# Patient Record
Sex: Female | Born: 1935 | Race: White | Hispanic: Yes | Smoking: Never smoker
Health system: Southern US, Community
[De-identification: ages and names within clinical notes are randomized; demographics above are authoritative.]

## PROBLEM LIST (undated history)

## (undated) DIAGNOSIS — I219 Acute myocardial infarction, unspecified: Secondary | ICD-10-CM

## (undated) DIAGNOSIS — I509 Heart failure, unspecified: Secondary | ICD-10-CM

## (undated) DIAGNOSIS — I10 Essential (primary) hypertension: Secondary | ICD-10-CM

## (undated) HISTORY — PX: CATARACT EXTRACTION: SUR2

## (undated) HISTORY — PX: ROTATOR CUFF REPAIR: SHX139

## (undated) HISTORY — PX: BACK SURGERY: SHX140

## (undated) HISTORY — PX: OTHER SURGICAL HISTORY: SHX169

---

## 2012-12-21 ENCOUNTER — Inpatient Hospital Stay: Payer: Self-pay | Admitting: Internal Medicine

## 2012-12-21 LAB — COMPREHENSIVE METABOLIC PANEL
Albumin: 4.1 g/dL (ref 3.4–5.0)
Alkaline Phosphatase: 57 U/L (ref 50–136)
Anion Gap: 5 — ABNORMAL LOW (ref 7–16)
Calcium, Total: 9.2 mg/dL (ref 8.5–10.1)
Chloride: 105 mmol/L (ref 98–107)
Co2: 28 mmol/L (ref 21–32)
Creatinine: 1.26 mg/dL (ref 0.60–1.30)
EGFR (African American): 48 — ABNORMAL LOW
EGFR (Non-African Amer.): 41 — ABNORMAL LOW
Total Protein: 6.9 g/dL (ref 6.4–8.2)

## 2012-12-21 LAB — LIPID PANEL
HDL Cholesterol: 44 mg/dL (ref 40–60)
Ldl Cholesterol, Calc: 128 mg/dL — ABNORMAL HIGH (ref 0–100)
VLDL Cholesterol, Calc: 33 mg/dL (ref 5–40)

## 2012-12-21 LAB — CBC
HCT: 39.3 % (ref 35.0–47.0)
HGB: 13.5 g/dL (ref 12.0–16.0)
MCH: 31.4 pg (ref 26.0–34.0)
RBC: 4.3 10*6/uL (ref 3.80–5.20)
RDW: 12.8 % (ref 11.5–14.5)

## 2012-12-22 LAB — CBC WITH DIFFERENTIAL/PLATELET
Basophil #: 0.1 10*3/uL (ref 0.0–0.1)
Eosinophil #: 0.3 10*3/uL (ref 0.0–0.7)
Eosinophil %: 5 %
HCT: 37.4 % (ref 35.0–47.0)
HGB: 12.9 g/dL (ref 12.0–16.0)
Lymphocyte #: 1.8 10*3/uL (ref 1.0–3.6)
MCV: 91 fL (ref 80–100)
Neutrophil #: 3.2 10*3/uL (ref 1.4–6.5)
Neutrophil %: 54.9 %
RDW: 12.9 % (ref 11.5–14.5)
WBC: 5.8 10*3/uL (ref 3.6–11.0)

## 2012-12-22 LAB — BASIC METABOLIC PANEL
Anion Gap: 4 — ABNORMAL LOW (ref 7–16)
Creatinine: 1.12 mg/dL (ref 0.60–1.30)
EGFR (Non-African Amer.): 47 — ABNORMAL LOW

## 2012-12-22 LAB — CK TOTAL AND CKMB (NOT AT ARMC): CK, Total: 599 U/L — ABNORMAL HIGH (ref 21–215)

## 2012-12-22 LAB — APTT: Activated PTT: 36.3 secs — ABNORMAL HIGH (ref 23.6–35.9)

## 2012-12-26 DIAGNOSIS — E785 Hyperlipidemia, unspecified: Secondary | ICD-10-CM | POA: Insufficient documentation

## 2012-12-26 DIAGNOSIS — I252 Old myocardial infarction: Secondary | ICD-10-CM | POA: Insufficient documentation

## 2012-12-26 DIAGNOSIS — I2510 Atherosclerotic heart disease of native coronary artery without angina pectoris: Secondary | ICD-10-CM | POA: Insufficient documentation

## 2013-01-08 ENCOUNTER — Encounter: Payer: Self-pay | Admitting: Cardiovascular Disease

## 2013-01-09 DIAGNOSIS — I509 Heart failure, unspecified: Secondary | ICD-10-CM | POA: Insufficient documentation

## 2013-02-02 ENCOUNTER — Encounter: Payer: Self-pay | Admitting: Cardiovascular Disease

## 2013-03-05 ENCOUNTER — Encounter: Payer: Self-pay | Admitting: Cardiovascular Disease

## 2013-06-23 ENCOUNTER — Ambulatory Visit: Payer: Self-pay | Admitting: Internal Medicine

## 2014-03-16 DIAGNOSIS — I059 Rheumatic mitral valve disease, unspecified: Secondary | ICD-10-CM | POA: Insufficient documentation

## 2014-04-21 ENCOUNTER — Ambulatory Visit: Payer: Self-pay | Admitting: Orthopedic Surgery

## 2014-06-25 NOTE — Discharge Summary (Signed)
PATIENT NAME:  Gail Paul, Gail Paul MR#:  [id number] DATE OF BIRTH:  12-Sep-1933  DATE OF ADMISSION:  12/21/2012 DATE OF DISCHARGE:  12/23/2012  PRESENTING COMPLAINT: Nausea and vomiting.   DISCHARGE DIAGNOSES:  1.  Acute non-Q-wave myocardial infarction, status post cardiac catheterization.  2.  Coronary artery disease, new onset.  3.  Hyperlipidemia.   CONDITION ON DISCHARGE: Fair.   CODE STATUS: Full code.   CARDIAC CATHETERIZATION: Done by Dr. Adrian BlackwaterShaukat Khan on 10/20 showed 40% stenosis in mid LAD, proximal circumflex 30% stenosis, first obtuse marginal there was 50% stenosis. Proximal RCA there was 90%, mid RCA stenoses that was tubular 100% stenosis of the lesion which was associated with small filling defect,  consistent with thrombus. Medical management recommended.   MEDICATIONS:   1.  Imdur 30 mg p.o. daily.  2.  Ranexa 1000 mg extended-release b.Paul.d.  3.  Atorvastatin 80 mg daily.  4.  Aspirin 81 mg daily.  5.  Plavix 75 mg daily.  6.  Metoprolol extended-release 25 mg daily.  7.  Nitroglycerin 0.4 mg sublingually as needed.   DIET: Low-fat, low-cholesterol.   FOLLOWUP: With Dr. Adrian BlackwaterShaukat Khan, Friday 10/24 at 10:00 a.m.   LABS AT DISCHARGE: Echo Doppler showed ejection fraction of 60% to 65%, mild concentrate left ventricular hypertrophy, mild increased left ventricular septal thickness and moderately increased left posterior wall thickness.   PROCEDURES: Cardiac catheterization done by Dr. Adrian BlackwaterShaukat Khan, results as above. Troponin went up to 0.46, 3.9 and 4.88. CBC within normal limits.   BRIEF SUMMARY OF HOSPITAL COURSE: Gail Paul is a very pleasant Caucasian female with history of hyperlipidemia, who comes to the Emergency Room after she started having nausea, vomiting, and burning sensation in her midsternal area. She was found to have elevated troponin and bradycardia along with EKG changes suggestive of inferior wall MI. She was admitted with:  1.  Acute non-Q-wave MI. The  patient was admitted in the CCU and initially started on Lovenox, which was changed to IV heparin drip by Dr. Welton FlakesKhan. Her troponin went from 0.46 to 3.9 to 4.8. She did not have any chest pain. EKG showed ST-T changes with bradycardia. She was placed on aspirin, Toprol, Ranexa, Imdur, statins and Plavix. Cardiac catheter was performed and results as above were noted. Medical management was recommended per cardiology. Cardiac rehab will be set up by Dr. Welton FlakesKhan as an outpatient.  2.  Hyperlipidemia, on atorvastatin.  3.  Hypotension, likely due to inferior wall MI, stable after IV fluids.  4.  DVT prophylaxis. The patient was kept on heparin, aspirin and Plavix.  5.  Hospital stay otherwise remained stable. The patient was advised lifestyle changes along with diet and exercise. She voiced understanding. She will follow up with Dr. Welton FlakesKhan on the above-stated date. Hospital stay remained stable.   CODE STATUS: She remained a full code.   TIME SPENT: 40 minutes.   ____________________________ Wylie HailSona A. Allena KatzPatel, MD sap:aw D: 12/24/2012 07:38:07 ET T: 12/24/2012 07:58:29 ET JOB#: [id number]  cc: Renada Cronin A. Allena KatzPatel, MD, <Dictator> Laurier NancyShaukat A. Khan, MD Willow OraSONA A Quanna Wittke MD ELECTRONICALLY SIGNED 12/28/2012 10:53

## 2014-06-25 NOTE — Consult Note (Signed)
PATIENT NAME:  Gail Paul, Gail Paul MR#:  [id number] DATE OF BIRTH:  September 18, 1933  DATE OF CONSULTATION:  12/22/2012  CONSULTING PHYSICIAN:  Laurier NancyShaukat A. Khan, MD  INDICATION FOR CONSULTATION: Chest pain.   HISTORY OF PRESENT ILLNESS: This is a 81 year old Hispanic female with a past medical history of hyperlipidemia, came into the hospital with an episode of chest pain and vomiting. The patient states that yesterday she went shopping and in the shopping malls, she smelled fumes, and afterwards, she told her friend that she was feeling sick, and she got out of the shopping mall, started feeling short of breath, epigastric pain, and had to sit down. Her face was flushed. She went home and took a nap and 3 hours later woke up and started raking leaves at her home, started having, again, epigastric pain. This time, it was much severe and associated with nausea and vomiting; thus, she was brought to the hospital.   PAST MEDICAL HISTORY: Hyperlipidemia.   ALLERGIES: None.   MEDICATIONS:  None.   SOCIAL HISTORY: She denies EtOH abuse or smoking.   FAMILY HISTORY:  Her mother died at 79. Her father died at age 79; apparently, he was a heavy smoker, did have coronary artery disease.   PHYSICAL EXAMINATION:  GENERAL: She is alert, oriented x 3, in no acute distress.  VITAL SIGNS: Stable.  NECK:  Revealed no JVD.  LUNGS: Clear.  HEART: Regular rate, rhythm. Normal S1, S2. No audible murmur.  ABDOMEN: Soft, nontender. Positive bowel sounds.  EXTREMITIES: No pedal edema.   Initial troponin on labs was 0.60, then 0.46. Her LDL was 128. Triglyceride 167, total cholesterol 205, HDL 44. Her troponin now is 3.90. CPK is 688, MB fraction 31.3. Creatinine is 1.12. Potassium is 4.1. EKG shows normal sinus rhythm, 89 beats per minute, old inferior wall MI with Q waves in the inferior leads, some nonspecific ST-T changes in the inferior leads.   ASSESSMENT AND PLAN: The patient may have had right coronary artery  disease with evidence of old inferior wall myocardial infarction, right now seems like there is no ST elevation on the EKG. May have had an MI in the past, and now having non-STEMI. We will do catheterization as soon as possible.   Thank you very much for the referral.   ____________________________ Laurier NancyShaukat A. Khan, MD sak:dmm D: 12/22/2012 08:49:00 ET T: 12/22/2012 09:35:45 ET JOB#: [id number]  cc: Laurier NancyShaukat A. Khan, MD, <Dictator> Laurier NancySHAUKAT A KHAN MD ELECTRONICALLY SIGNED 12/29/2012 15:19

## 2014-06-25 NOTE — H&P (Signed)
PATIENT NAME:  Gail Paul, Gail Paul I MR#:  [id number] DATE OF BIRTH:  [birth year]  DATE OF ADMISSION:  12/21/2012  PRIMARY CARE PHYSICIAN: None  REFERRING PHYSICIAN: Dr. Daryel November  CHIEF COMPLAINT:  Burning in the chest, vomiting.   HISTORY OF PRESENT ILLNESS: Gail Paul is a 81 year old female well functional at baseline. Has history of hyperlipidemia, presented to the Emergency Department with burning in the chest, vomiting. The patient states this afternoon while walking in the mall felt a strong smell in which caused her to have burning in the lungs and chest. After going home, the patient was cleaning garage, experienced pain in the epigastric area followed by one episode of vomiting, which is resolved the pain. The patient had another episode. Concerning this, the patient is brought to the Emergency Department initial work-up showed the patient had elevated troponin of 0.46. EKG does not show any acute abnormality, however showed poor R wave progression in the anterior leads. The patient denied having any previous episodes of chest pain. No previous work-up for the heart.  The patient is well functional at baseline, still works.     PAST MEDICAL HISTORY:  Hyperlipidemia.   PAST SURGICAL HISTORY: None.   ALLERGIES: None.   HOME MEDICATIONS: None.    SOCIAL HISTORY: No history of smoking, drinking, alcohol or using illicit drugs. 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  She lives by herself recently divorced.    FAMILY HISTORY: History of longevity mother died at the age of 6. Father died at the age of 25, who was a heavy smoker.   REVIEW OF SYSTEMS:   CONSTITUTIONAL: Denies any generalized weakness.  EYES:   no change in vision.  ENT: No change in hearing.  RESPIRATORY:  No cough, shortness of breath. Experienced some burning sensation i the chest.    GASTROINTESTINAL: Had two episodes of vomiting.  GENITOURINARY: No dysuria or hematuria.  ENDOCRINE: No polyuria or  polydipsia.  HEMATOLOGIC:  No easy bruising or bleeding.  SKIN: No rash or lesions.  MUSCULOSKELETAL: No joint pains and aches.  NEUROLOGIC: No weakness or numbness in any part of the body.  PSYCHIATRIC:  The patient states she has been under a lot of stress lately.    PHYSICAL EXAMINATION: GENERAL: This is a well-built, well-nourished, age-appropriate female lying down in the bed, not in distress.  VITAL SIGNS: Temperature 98.1, pulse 84, blood pressure 191/76, respiratory rate of 18, oxygen saturation is 98% on room air.  HEENT: Head normocephalic atraumatic. Eyes:  No scleral icterus. Conjunctivae normal. Pupils equal and react to light. Extraocular movements are intact. Mucous membranes moist. No pharyngeal erythema.  NECK: Supple. No lymphadenopathy. No JVD. No carotid bruit.  CHEST: Has no focal tenderness.  LUNGS: Bilaterally clear to auscultation.  HEART: S1, S2 regular. No murmurs are heard. No pedal edema. Pulses 2+.  ABDOMEN: Bowel sounds present. Soft, nontender, nondistended.  SKIN: No rash or lesions.  MUSCULOSKELETAL: Good range of motion in all the extremities.  NEUROLOGIC:  The patient is alert, oriented to place, person and time. Cranial nerves II through XII intact. Motor 5/5 in upper and lower extremities.   LABORATORY DATA: CBC is completely within normal limits. BMP is completely within normal limits. Troponin 0.06.   EKG, 12-lead: Poor R wave progression in the inferior leads.    ASSESSMENT AND PLAN: Gail Paul in a 81 year old female who comes to the Emergency Department with, vomiting and burning sensation and is found to have non-ST-elevation myocardial infarction.  1.  Elevated troponins. Waiting for CK-MB. Continue with aspirin 325 mg daily, Lovenox. Admit the patient to the monitored bed, obtain cardiology consult in the morning. Obtain an echocardiogram, looking for any wall motion abnormalities. We will also obtain lipid profile. Start the patient on Lipitor.  Also start the patient on metoprolol and lisinopril.  2.  Hypertension, accelerated. The patient is started on metoprolol and lisinopril, will also keep labetalol as needed.  3.  Hyperlipidemia. We will check lipid profile. Continue with Lipitor.  4.  The patient is will be on therapeutic Lovenox.   TIME SPENT: 45 minutes.    ____________________________ Susa GriffinsPadmaja Charlyn Vialpando, MD pv:cc D: 12/21/2012 22:18:45 ET T: 12/21/2012 23:06:34 ET JOB#: [id number]  cc: Susa GriffinsPadmaja Anabelle Bungert, MD, <Dictator> Susa GriffinsPADMAJA Xzandria Clevinger MD ELECTRONICALLY SIGNED 01/03/2013 3:10

## 2014-08-16 NOTE — Care Management (Signed)
Call received from Dr. Martha Clan: Patient expressed concern that he discussed with her during office visit regarding limited assistance in her home. Patient is pending shoulder repair and trying to prepare for discharge. This RNCM attempted to reach patient at her home number [phone]; message left for her to contact this RNCM. Email was sent to [email or web address] with list of sitter agencies under private pay. Home health physical therapy and possibly nursing assistant may assist with patient discharge.

## 2014-08-17 NOTE — Care Management (Cosign Needed)
Pre-op shoulder surgery: Received callback from patient. She states that her primary care provider Dr. Welton Flakes said "she could go to rehab after she has her shoulder done". Patient expressed concern with returning home alone and "being on strong pain medicines that have in the past caused her to fall". I explained that her insurance may not cover an outpatient procedure for rehab/SNF and that she should prepare in case it will not- she agrees. She states she has someone that will come to her house twice a day to care for her dogs. She has a cousin in [place] that assisted her after a heart attack one year ago. The patient had not thought about "who" would provide transportation to and from hospital until now. She states she has a bedside commode in the home. She agrees to making some arrangements with friends and possibly family prior to having surgery. She has lived independently in a house here in [place] for over 1 year. She will check her email regarding private pay agencies I sent in case she cannot find family/community assistance. She does not have Medicaid.

## 2014-09-23 ENCOUNTER — Other Ambulatory Visit: Payer: Self-pay | Admitting: Orthopaedic Surgery

## 2014-09-23 DIAGNOSIS — M75101 Unspecified rotator cuff tear or rupture of right shoulder, not specified as traumatic: Secondary | ICD-10-CM

## 2014-10-01 ENCOUNTER — Ambulatory Visit
Admission: RE | Admit: 2014-10-01 | Discharge: 2014-10-01 | Disposition: A | Discharge status: Home / Self Care | Payer: Medicare Other | Source: Ambulatory Visit | Attending: Orthopaedic Surgery | Admitting: Orthopaedic Surgery

## 2014-10-01 DIAGNOSIS — M75101 Unspecified rotator cuff tear or rupture of right shoulder, not specified as traumatic: Secondary | ICD-10-CM | POA: Diagnosis present

## 2014-10-01 DIAGNOSIS — M19011 Primary osteoarthritis, right shoulder: Secondary | ICD-10-CM | POA: Diagnosis not present

## 2014-10-01 DIAGNOSIS — M75121 Complete rotator cuff tear or rupture of right shoulder, not specified as traumatic: Secondary | ICD-10-CM | POA: Insufficient documentation

## 2014-10-04 ENCOUNTER — Emergency Department
Admission: EM | Admit: 2014-10-04 | Discharge: 2014-10-05 | Disposition: A | Discharge status: Home / Self Care | Payer: Medicare Other | Attending: Emergency Medicine | Admitting: Emergency Medicine

## 2014-10-04 ENCOUNTER — Other Ambulatory Visit: Payer: Self-pay

## 2014-10-04 ENCOUNTER — Encounter: Payer: Self-pay | Admitting: *Deleted

## 2014-10-04 DIAGNOSIS — R42 Dizziness and giddiness: Secondary | ICD-10-CM | POA: Diagnosis present

## 2014-10-04 DIAGNOSIS — I959 Hypotension, unspecified: Secondary | ICD-10-CM | POA: Insufficient documentation

## 2014-10-04 DIAGNOSIS — Z7982 Long term (current) use of aspirin: Secondary | ICD-10-CM | POA: Insufficient documentation

## 2014-10-04 DIAGNOSIS — Z7901 Long term (current) use of anticoagulants: Secondary | ICD-10-CM | POA: Diagnosis not present

## 2014-10-04 DIAGNOSIS — Z79899 Other long term (current) drug therapy: Secondary | ICD-10-CM | POA: Insufficient documentation

## 2014-10-04 DIAGNOSIS — E860 Dehydration: Secondary | ICD-10-CM

## 2014-10-04 HISTORY — DX: Acute myocardial infarction, unspecified: I21.9

## 2014-10-04 LAB — CBC
HCT: 36.4 % (ref 35.0–47.0)
HEMOGLOBIN: 12.1 g/dL (ref 12.0–16.0)
MCH: 30.8 pg (ref 26.0–34.0)
MCHC: 33.4 g/dL (ref 32.0–36.0)
MCV: 92.4 fL (ref 80.0–100.0)
PLATELETS: 238 10*3/uL (ref 150–440)
RBC: 3.94 MIL/uL (ref 3.80–5.20)
RDW: 13 % (ref 11.5–14.5)
WBC: 7.5 10*3/uL (ref 3.6–11.0)

## 2014-10-04 LAB — URINALYSIS COMPLETE WITH MICROSCOPIC (ARMC ONLY)
Bacteria, UA: NONE SEEN
Bilirubin Urine: NEGATIVE
Glucose, UA: NEGATIVE mg/dL
Hgb urine dipstick: NEGATIVE
KETONES UR: NEGATIVE mg/dL
Nitrite: NEGATIVE
PROTEIN: NEGATIVE mg/dL
RBC / HPF: NONE SEEN RBC/hpf (ref 0–5)
Specific Gravity, Urine: 1.018 (ref 1.005–1.030)
pH: 5 (ref 5.0–8.0)

## 2014-10-04 LAB — BASIC METABOLIC PANEL
Anion gap: 8 (ref 5–15)
BUN: 45 mg/dL — ABNORMAL HIGH (ref 6–20)
CO2: 18 mmol/L — ABNORMAL LOW (ref 22–32)
CREATININE: 1.32 mg/dL — AB (ref 0.44–1.00)
Calcium: 8.2 mg/dL — ABNORMAL LOW (ref 8.9–10.3)
Chloride: 104 mmol/L (ref 101–111)
GFR calc Af Amer: 43 mL/min — ABNORMAL LOW (ref >60)
GFR calc non Af Amer: 37 mL/min — ABNORMAL LOW (ref >60)
Glucose, Bld: 104 mg/dL — ABNORMAL HIGH (ref 65–99)
POTASSIUM: 4.9 mmol/L (ref 3.5–5.1)
Sodium: 130 mmol/L — ABNORMAL LOW (ref 135–145)

## 2014-10-04 LAB — TROPONIN I
TROPONIN I: 0.03 ng/mL (ref <0.031)
Troponin I: 0.03 ng/mL (ref <0.031)

## 2014-10-04 MED ORDER — SODIUM CHLORIDE 0.9 % IV BOLUS (SEPSIS)
1000.0000 mL | Freq: Once | INTRAVENOUS | Status: AC
  Administered 2014-10-04: 1000 mL via INTRAVENOUS

## 2014-10-04 NOTE — ED Notes (Signed)
Dr. Schaevitz at bedside.  

## 2014-10-04 NOTE — ED Notes (Signed)
Pt states that she checked her bp at the pharmacy and it was 80/41. Pt states she was checking her blood pressure because she was told to monitor it by her kidney doctor because it was low last week. She states that she has felt dizzy and lethargic.

## 2014-10-04 NOTE — ED Provider Notes (Addendum)
[facility] Emergency Department Provider Note  ____________________________________________  Time seen: Approximately 10 PM  I have reviewed the triage vital signs and the nursing notes.   HISTORY  Chief Complaint Hypotension    HPI Gail Paul is a 81 y.o. female with a history of MI in the past who presents today with multiple episodes of lightheadedness, fatigue and low blood pressure. She said that she came in today after taking her blood pressure at the pharmacy and found to be in the 80s over 40s. She denies any chest pain or shortness of breath. Denies any nausea or vomiting. However, she says that she does not drink much water and has been having a decreased appetite over the past weeks to months. Denies any focal weakness.No new medications or medication adjustments.   Past Medical History  Diagnosis Date  . Heart attack     There are no active problems to display for this patient.   Past Surgical History  Procedure Laterality Date  . Back surgery    . Carpel tunnel surgery    . Cataract extraction      Current Outpatient Rx  Name  Route  Sig  Dispense  Refill  . Ascorbic Acid (VITAMIN C) 1000 MG tablet   Oral   Take 1,000 mg by mouth daily.         Marland Kitchen aspirin 81 MG tablet   Oral   Take 81 mg by mouth daily.         Marland Kitchen atorvastatin (LIPITOR) 80 MG tablet   Oral   Take 80 mg by mouth daily.         . B Complex-C (B-COMPLEX WITH VITAMIN C) tablet   Oral   Take 1 tablet by mouth daily.         . cholecalciferol (VITAMIN D) 1000 UNITS tablet   Oral   Take 1,000 Units by mouth daily.         . citalopram (CELEXA) 20 MG tablet   Oral   Take 20 mg by mouth daily.         . clonazePAM (KLONOPIN) 0.5 MG tablet   Oral   Take 0.5 mg by mouth daily.          . clopidogrel (PLAVIX) 75 MG tablet   Oral   Take 75 mg by mouth daily.         Marland Kitchen co-enzyme Q-10 50 MG capsule   Oral   Take 100 mg by mouth daily.          . isosorbide mononitrate (IMDUR) 30 MG 24 hr tablet   Oral   Take 30 mg by mouth daily.         Marland Kitchen lisinopril (PRINIVIL,ZESTRIL) 10 MG tablet   Oral   Take 10 mg by mouth daily.         . meloxicam (MOBIC) 7.5 MG tablet   Oral   Take 7.5 mg by mouth daily.         . metFORMIN (GLUCOPHAGE) 500 MG tablet   Oral   Take 500 mg by mouth daily with breakfast.         . metoprolol tartrate (LOPRESSOR) 25 MG tablet   Oral   Take 25 mg by mouth daily.          . Omega-3 Fatty Acids (FISH OIL) 1000 MG CAPS   Oral   Take 1,000 mg by mouth daily.  Allergies Shellfish allergy  No family history on file.  Social History History  Substance Use Topics  . Smoking status: Never Smoker   . Smokeless tobacco: Not on file  . Alcohol Use: No    Review of Systems Constitutional: No fever/chills Eyes: No visual changes. ENT: No sore throat. Cardiovascular: Denies chest pain. Respiratory: Denies shortness of breath. Gastrointestinal: No abdominal pain.  No nausea, no vomiting.  No diarrhea.  No constipation. Genitourinary: Negative for dysuria. Musculoskeletal: Negative for back pain. Skin: Negative for rash. Neurological: Negative for headaches, focal weakness or numbness.  10-point ROS otherwise negative.  ____________________________________________   PHYSICAL EXAM:  VITAL SIGNS: ED Triage Vitals  Enc Vitals Group     BP 10/04/14 1909 115/54 mmHg     Pulse Rate 10/04/14 1909 69     Resp 10/04/14 1909 16     Temp 10/04/14 1909 98.7 F (37.1 C)     Temp Source 10/04/14 1909 Oral     SpO2 10/04/14 1909 100 %     Weight 10/04/14 1909 168 lb (76.204 kg)     Height 10/04/14 1909 5\' 3"  (1.6 m)     Head Cir --      Peak Flow --      Pain Score 10/04/14 1910 5     Pain Loc --      Pain Edu? --      Excl. in GC? --     Constitutional: Alert and oriented. Well appearing and in no acute distress. Eyes: Conjunctivae are normal. PERRL. EOMI. Head:  Atraumatic. Nose: No congestion/rhinnorhea. Mouth/Throat: Mucous membranes are moist.  Oropharynx non-erythematous. Neck: No stridor.   Cardiovascular: Normal rate, regular rhythm. Grossly normal heart sounds.  Good peripheral circulation. Respiratory: Normal respiratory effort.  No retractions. Lungs CTAB. Gastrointestinal: Soft and nontender. No distention. No abdominal bruits. No CVA tenderness. Musculoskeletal: No lower extremity tenderness nor edema.  No joint effusions. Neurologic:  Normal speech and language. No gross focal neurologic deficits are appreciated. No gait instability. Skin:  Skin is warm, dry and intact. No rash noted. Psychiatric: Mood and affect are normal. Speech and behavior are normal.  ____________________________________________   LABS (all labs ordered are listed, but only abnormal results are displayed)  Labs Reviewed  BASIC METABOLIC PANEL - Abnormal; Notable for the following:    Sodium 130 (*)    CO2 18 (*)    Glucose, Bld 104 (*)    BUN 45 (*)    Creatinine, Ser 1.32 (*)    Calcium 8.2 (*)    GFR calc non Af Amer 37 (*)    GFR calc Af Amer 43 (*)    All other components within normal limits  URINALYSIS COMPLETEWITH MICROSCOPIC ([facility] ONLY) - Abnormal; Notable for the following:    Color, Urine YELLOW (*)    APPearance CLEAR (*)    Leukocytes, UA 2+ (*)    Squamous Epithelial / LPF 0-5 (*)    All other components within normal limits  URINE CULTURE  CBC  TROPONIN I  TROPONIN I  CBG MONITORING, ED   ____________________________________________  EKG  ED ECG REPORT I, Arelia Longest, the attending physician, personally viewed and interpreted this ECG.   Date: 10/04/2014  EKG Time: 1911  Rate: 70  Rhythm: normal sinus rhythm  Axis: Normal axis  Intervals:none  ST&T Change: No ST elevations or depressions. No abnormal T-wave inversions. No change from previous EKG from October  2014. ____________________________________________  RADIOLOGY   ____________________________________________  PROCEDURES   ____________________________________________   INITIAL IMPRESSION / ASSESSMENT AND PLAN / ED COURSE  Pertinent labs & imaging results that were available during my care of the patient were reviewed by me and considered in my medical decision making (see chart for details).  Plan is for patient to receive fluids. We'll trend troponins secondary to first troponin being at 0.03. Discussed extensively with the patient about the need to stay hydrated as well as the need for adequate nutrition. We discussed meal replacement such as Ensure so that she gets her proper nutrition. She understands the plan and is willing to comply.  Signed out to Dr. Zenda Alpers. ____________________________________________   FINAL CLINICAL IMPRESSION(S) / ED DIAGNOSES  Generalized weakness and dehydration. Initial visit.    Myrna Blazer, MD 10/04/14 2335  No further episodes of hypotension. The patient is actually now hypertensive. Continues to be asymptomatic.  Myrna Blazer, MD 10/04/14 2336  Second troponin back and is stable at 0.03. We'll discharge to home. Patient has follow-up with her primary care doctor in the morning. Continues to be asymptomatic at this time.  Myrna Blazer, MD 10/04/14 [phone]

## 2014-10-04 NOTE — ED Notes (Signed)

## 2014-10-04 NOTE — Discharge Instructions (Signed)
Dehydration, Adult Dehydration is when you lose more fluids from the body than you take in. Vital organs like the kidneys, brain, and heart cannot function without a proper amount of fluids and salt. Any loss of fluids from the body can cause dehydration.  CAUSES   Vomiting.  Diarrhea.  Excessive sweating.  Excessive urine output.  Fever. SYMPTOMS  Mild dehydration  Thirst.  Dry lips.  Slightly dry mouth. Moderate dehydration  Very dry mouth.  Sunken eyes.  Skin does not bounce back quickly when lightly pinched and released.  Dark urine and decreased urine production.  Decreased tear production.  Headache. Severe dehydration  Very dry mouth.  Extreme thirst.  Rapid, weak pulse (more than 100 beats per minute at rest).  Cold hands and feet.  Not able to sweat in spite of heat and temperature.  Rapid breathing.  Blue lips.  Confusion and lethargy.  Difficulty being awakened.  Minimal urine production.  No tears. DIAGNOSIS  Your caregiver will diagnose dehydration based on your symptoms and your exam. Blood and urine tests will help confirm the diagnosis. The diagnostic evaluation should also identify the cause of dehydration. TREATMENT  Treatment of mild or moderate dehydration can often be done at home by increasing the amount of fluids that you drink. It is best to drink small amounts of fluid more often. Drinking too much at one time can make vomiting worse. Refer to the home care instructions below. Severe dehydration needs to be treated at the hospital where you will probably be given intravenous (IV) fluids that contain water and electrolytes. HOME CARE INSTRUCTIONS   Ask your caregiver about specific rehydration instructions.  Drink enough fluids to keep your urine clear or pale yellow.  Drink small amounts frequently if you have nausea and vomiting.  Eat as you normally do.  Avoid:  Foods or drinks high in sugar.  Carbonated  drinks.  Juice.  Extremely hot or cold fluids.  Drinks with caffeine.  Fatty, greasy foods.  Alcohol.  Tobacco.  Overeating.  Gelatin desserts.  Wash your hands well to avoid spreading bacteria and viruses.  Only take over-the-counter or prescription medicines for pain, discomfort, or fever as directed by your caregiver.  Ask your caregiver if you should continue all prescribed and over-the-counter medicines.  Keep all follow-up appointments with your caregiver. SEEK MEDICAL CARE IF:  You have abdominal pain and it increases or stays in one area (localizes).  You have a rash, stiff neck, or severe headache.  You are irritable, sleepy, or difficult to awaken.  You are weak, dizzy, or extremely thirsty. SEEK IMMEDIATE MEDICAL CARE IF:   You are unable to keep fluids down or you get worse despite treatment.  You have frequent episodes of vomiting or diarrhea.  You have blood or green matter (bile) in your vomit.  You have blood in your stool or your stool looks black and tarry.  You have not urinated in 6 to 8 hours, or you have only urinated a small amount of very dark urine.  You have a fever.  You faint. MAKE SURE YOU:   Understand these instructions.  Will watch your condition.  Will get help right away if you are not doing well or get worse. Document Released: 02/19/2005 Document Revised: 05/14/2011 Document Reviewed: 10/09/2010 Exit[employer] Patient Information 2015 [employer]. This information is not intended to replace advice given to you by your health care provider. Make sure you discuss any questions you have with your health care   provider.  

## 2014-10-04 NOTE — ED Notes (Signed)
Patient present to ED with complaint of low blood pressure and weakness, "i feel tired." Patient reports approximately 2 months ago patient blood pressure was increased by PCP and reports symptoms began then. Patient reports today began to feel dizzy and tired. Patient was seen by nephrologist today and bp was "90/45" and patient rechecked bp at pharmacy because "I was feeling lightheaded and it was 80/41." Reports she slept for 14 hours, which patient reports is unusual for her. Patient has bilateral rotator cuff pain from previous injury. Patient denies chest pain, shortness of breath, or abdominal pain. Patient alert and oriented x 4, respirations even and unlabored, call bell within reach.

## 2014-10-06 LAB — URINE CULTURE

## 2015-01-11 ENCOUNTER — Other Ambulatory Visit: Payer: Self-pay | Admitting: Internal Medicine

## 2015-01-11 DIAGNOSIS — Z1231 Encounter for screening mammogram for malignant neoplasm of breast: Secondary | ICD-10-CM

## 2015-01-14 ENCOUNTER — Ambulatory Visit
Admission: RE | Admit: 2015-01-14 | Discharge: 2015-01-14 | Disposition: A | Discharge status: Home / Self Care | Payer: Medicare Other | Source: Ambulatory Visit | Attending: Internal Medicine | Admitting: Internal Medicine

## 2015-01-14 DIAGNOSIS — Z1231 Encounter for screening mammogram for malignant neoplasm of breast: Secondary | ICD-10-CM | POA: Insufficient documentation

## 2015-04-19 ENCOUNTER — Encounter: Payer: Self-pay | Admitting: Emergency Medicine

## 2015-04-19 ENCOUNTER — Emergency Department: Payer: Medicare Other

## 2015-04-19 ENCOUNTER — Emergency Department
Admission: EM | Admit: 2015-04-19 | Discharge: 2015-04-19 | Disposition: A | Discharge status: Home / Self Care | Payer: Medicare Other | Attending: Emergency Medicine | Admitting: Emergency Medicine

## 2015-04-19 DIAGNOSIS — Z7902 Long term (current) use of antithrombotics/antiplatelets: Secondary | ICD-10-CM | POA: Insufficient documentation

## 2015-04-19 DIAGNOSIS — Z791 Long term (current) use of non-steroidal anti-inflammatories (NSAID): Secondary | ICD-10-CM | POA: Diagnosis not present

## 2015-04-19 DIAGNOSIS — Z7982 Long term (current) use of aspirin: Secondary | ICD-10-CM | POA: Insufficient documentation

## 2015-04-19 DIAGNOSIS — Y9389 Activity, other specified: Secondary | ICD-10-CM | POA: Diagnosis not present

## 2015-04-19 DIAGNOSIS — S79921A Unspecified injury of right thigh, initial encounter: Secondary | ICD-10-CM | POA: Insufficient documentation

## 2015-04-19 DIAGNOSIS — W108XXA Fall (on) (from) other stairs and steps, initial encounter: Secondary | ICD-10-CM | POA: Insufficient documentation

## 2015-04-19 DIAGNOSIS — T148XXA Other injury of unspecified body region, initial encounter: Secondary | ICD-10-CM

## 2015-04-19 DIAGNOSIS — Y9289 Other specified places as the place of occurrence of the external cause: Secondary | ICD-10-CM | POA: Insufficient documentation

## 2015-04-19 DIAGNOSIS — R0602 Shortness of breath: Secondary | ICD-10-CM | POA: Diagnosis not present

## 2015-04-19 DIAGNOSIS — S301XXA Contusion of abdominal wall, initial encounter: Secondary | ICD-10-CM | POA: Diagnosis not present

## 2015-04-19 DIAGNOSIS — S20211A Contusion of right front wall of thorax, initial encounter: Secondary | ICD-10-CM | POA: Diagnosis not present

## 2015-04-19 DIAGNOSIS — S29001A Unspecified injury of muscle and tendon of front wall of thorax, initial encounter: Secondary | ICD-10-CM | POA: Diagnosis present

## 2015-04-19 DIAGNOSIS — Z7984 Long term (current) use of oral hypoglycemic drugs: Secondary | ICD-10-CM | POA: Diagnosis not present

## 2015-04-19 DIAGNOSIS — Y998 Other external cause status: Secondary | ICD-10-CM | POA: Diagnosis not present

## 2015-04-19 DIAGNOSIS — I10 Essential (primary) hypertension: Secondary | ICD-10-CM | POA: Insufficient documentation

## 2015-04-19 DIAGNOSIS — Z79899 Other long term (current) drug therapy: Secondary | ICD-10-CM | POA: Diagnosis not present

## 2015-04-19 HISTORY — DX: Essential (primary) hypertension: I10

## 2015-04-19 HISTORY — DX: Heart failure, unspecified: I50.9

## 2015-04-19 LAB — CBC WITH DIFFERENTIAL/PLATELET
BASOS ABS: 0 10*3/uL (ref 0–0.1)
BASOS PCT: 1 %
Eosinophils Absolute: 0.5 10*3/uL (ref 0–0.7)
Eosinophils Relative: 8 %
HEMATOCRIT: 27.9 % — AB (ref 35.0–47.0)
HEMOGLOBIN: 9.3 g/dL — AB (ref 12.0–16.0)
LYMPHS PCT: 17 %
Lymphs Abs: 1.1 10*3/uL (ref 1.0–3.6)
MCH: 30.2 pg (ref 26.0–34.0)
MCHC: 33.3 g/dL (ref 32.0–36.0)
MCV: 90.6 fL (ref 80.0–100.0)
Monocytes Absolute: 0.5 10*3/uL (ref 0.2–0.9)
Monocytes Relative: 8 %
NEUTROS ABS: 4.1 10*3/uL (ref 1.4–6.5)
NEUTROS PCT: 66 %
Platelets: 176 10*3/uL (ref 150–440)
RBC: 3.07 MIL/uL — AB (ref 3.80–5.20)
RDW: 13.7 % (ref 11.5–14.5)
WBC: 6.2 10*3/uL (ref 3.6–11.0)

## 2015-04-19 LAB — BASIC METABOLIC PANEL
ANION GAP: 7 (ref 5–15)
BUN: 21 mg/dL — ABNORMAL HIGH (ref 6–20)
CALCIUM: 8.3 mg/dL — AB (ref 8.9–10.3)
CO2: 26 mmol/L (ref 22–32)
Chloride: 109 mmol/L (ref 101–111)
Creatinine, Ser: 0.97 mg/dL (ref 0.44–1.00)
GFR, EST NON AFRICAN AMERICAN: 54 mL/min — AB (ref >60)
Glucose, Bld: 109 mg/dL — ABNORMAL HIGH (ref 65–99)
Potassium: 3.9 mmol/L (ref 3.5–5.1)
Sodium: 142 mmol/L (ref 135–145)

## 2015-04-19 MED ORDER — IOHEXOL 300 MG/ML  SOLN
100.0000 mL | Freq: Once | INTRAMUSCULAR | Status: AC | PRN
  Administered 2015-04-19: 100 mL via INTRAVENOUS
  Filled 2015-04-19: qty 100

## 2015-04-19 MED ORDER — ACETAMINOPHEN 500 MG PO TABS
1000.0000 mg | ORAL_TABLET | Freq: Once | ORAL | Status: AC
  Administered 2015-04-19: 1000 mg via ORAL
  Filled 2015-04-19: qty 2

## 2015-04-19 NOTE — ED Notes (Signed)
Pt in CT.

## 2015-04-19 NOTE — ED Provider Notes (Signed)
[facility] Emergency Department Provider Note  ____________________________________________  Time seen: Approximately 9:06 PM  I have reviewed the triage vital signs and the nursing notes.   HISTORY  Chief Complaint Fall    HPI EMBERLYNN RIGGAN is a 82 y.o. female with history of CAD on Plavix, who fell about 24 hours ago hitting the stairs with her chest, right breast area. She also had impact on the right lower abdomen. She has developed subsequent bruising that is spreading into the right breast and the right lower quadrant of her abdomen. She has pleuritic pain and pain with movement. She has bruising to the right thigh as well. She denies cough. She has some mild shortness of breath. No fevers or chills. No back pain. She has a history of tendinitis in the left wrist without worsening injury. She denies injury of the right wrist or arm. However she has a history of right rotator cuff tear and believes her pain has worsened since the fall. No neck pain or headache.   Past Medical History  Diagnosis Date  . Heart attack (HCC)   . CHF (congestive heart failure) (HCC)   . Hypertension     There are no active problems to display for this patient.   Past Surgical History  Procedure Laterality Date  . Back surgery    . Carpel tunnel surgery    . Cataract extraction      Current Outpatient Rx  Name  Route  Sig  Dispense  Refill  . Ascorbic Acid (VITAMIN C) 1000 MG tablet   Oral   Take 1,000 mg by mouth daily.         Marland Kitchen aspirin 81 MG tablet   Oral   Take 81 mg by mouth daily.         Marland Kitchen atorvastatin (LIPITOR) 80 MG tablet   Oral   Take 80 mg by mouth daily.         . B Complex-C (B-COMPLEX WITH VITAMIN C) tablet   Oral   Take 1 tablet by mouth daily.         . cholecalciferol (VITAMIN D) 1000 UNITS tablet   Oral   Take 1,000 Units by mouth daily.         . citalopram (CELEXA) 20 MG tablet   Oral   Take 20 mg by mouth daily.          . clonazePAM (KLONOPIN) 0.5 MG tablet   Oral   Take 0.5 mg by mouth daily.          . clopidogrel (PLAVIX) 75 MG tablet   Oral   Take 75 mg by mouth daily.         Marland Kitchen co-enzyme Q-10 50 MG capsule   Oral   Take 100 mg by mouth daily.         . isosorbide mononitrate (IMDUR) 30 MG 24 hr tablet   Oral   Take 30 mg by mouth daily.         Marland Kitchen lisinopril (PRINIVIL,ZESTRIL) 10 MG tablet   Oral   Take 10 mg by mouth daily.         . meloxicam (MOBIC) 7.5 MG tablet   Oral   Take 7.5 mg by mouth daily.         . metFORMIN (GLUCOPHAGE) 500 MG tablet   Oral   Take 500 mg by mouth daily with breakfast.         . metoprolol tartrate (  LOPRESSOR) 25 MG tablet   Oral   Take 25 mg by mouth daily.          . Omega-3 Fatty Acids (FISH OIL) 1000 MG CAPS   Oral   Take 1,000 mg by mouth daily.           Allergies Shellfish allergy  No family history on file.  Social History Social History  Substance Use Topics  . Smoking status: Never Smoker   . Smokeless tobacco: None  . Alcohol Use: No    Review of Systems Constitutional: No fever/chills Eyes: No visual changes. ENT: No sore throat. Cardiovascular: she has chest wall pain Respiratory: shortness of breath. Gastrointestinal: RLQ  abdominal pain.  No nausea, no vomiting.  No diarrhea.  No constipation. Genitourinary: Negative for dysuria. Musculoskeletal: Negative for back pain. Skin: Negative for rash. But has ecchymosis.  Neurological: Negative for headaches, focal weakness or numbness. 10-point ROS otherwise negative.  ____________________________________________   PHYSICAL EXAM:  VITAL SIGNS: ED Triage Vitals  Enc Vitals Group     BP 04/19/15 1853 131/50 mmHg     Pulse Rate 04/19/15 1853 74     Resp 04/19/15 1853 20     Temp 04/19/15 1853 98 F (36.7 C)     Temp Source 04/19/15 1853 Oral     SpO2 04/19/15 1853 96 %     Weight 04/19/15 1853 170 lb (77.111 kg)     Height 04/19/15 1853 5'  3" (1.6 m)     Head Cir --      Peak Flow --      Pain Score 04/19/15 1901 2     Pain Loc --      Pain Edu? --      Excl. in GC? --     Constitutional: Alert and oriented. Well appearing and in no acute distress. Eyes: Conjunctivae are normal. PERRL. EOMI. Ears:  Clear with normal landmarks. No erythema. Head: Atraumatic. Nose: No congestion/rhinnorhea. Mouth/Throat: Mucous membranes are moist.  Oropharynx non-erythematous. No lesions. Neck:  Supple.  No adenopathy.  No cervical spine tenderness to palpation. CHEST WALL: Tender over the chest wall. Cardiovascular: Normal rate, regular rhythm. Grossly normal heart sounds.  Good peripheral circulation. Respiratory: Normal respiratory effort.  No retractions. Lungs CTAB. Gastrointestinal: Soft and nontender. No distention. No abdominal bruits. No CVA tenderness. Musculoskeletal: Nml ROM of upper and lower extremity joints. Neurologic:  Normal speech and language. No gross focal neurologic deficits are appreciated. No gait instability. Skin:  Skin is warm, dry and intact. No rash noted. Areas of ecchymosis to the right breast and right lower abdomen. Areas are tender. Psychiatric: Mood and affect are normal. Speech and behavior are normal.  ____________________________________________   LABS (all labs ordered are listed, but only abnormal results are displayed)  Labs Reviewed  BASIC METABOLIC PANEL - Abnormal; Notable for the following:    Glucose, Bld 109 (*)    BUN 21 (*)    Calcium 8.3 (*)    GFR calc non Af Amer 54 (*)    All other components within normal limits  CBC WITH DIFFERENTIAL/PLATELET - Abnormal; Notable for the following:    RBC 3.07 (*)    Hemoglobin 9.3 (*)    HCT 27.9 (*)    All other components within normal limits   ____________________________________________  EKG   ____________________________________________  RADIOLOGY  CLINICAL DATA: 82 year old female status post fall yesterday landing on her  right side. Patient on Plavix and with severe right arm  pain.  EXAM: CT CHEST, ABDOMEN, AND PELVIS WITH CONTRAST  TECHNIQUE: Multidetector CT imaging of the chest, abdomen and pelvis was performed following the standard protocol during bolus administration of intravenous contrast.  CONTRAST: OMNIPAQUE IOHEXOL 300 MG/ML SOLN  COMPARISON: None.  FINDINGS: CT CHEST  Mediastinum: Unremarkable CT appearance of the thyroid gland. No suspicious mediastinal or hilar adenopathy. No soft tissue mediastinal mass. The thoracic esophagus is unremarkable.  Heart/Vascular: 2 vessel aortic arch. The right brachiocephalic and left common carotid artery share a common origin. Atherosclerotic calcifications present in the transverse aorta. Calcifications also present along the course of the coronary arteries including the left main, left anterior descending, circumflex and right. The heart itself is normal in size. No pericardial effusion.  Lungs/Pleura: The lungs are clear.  Bones/Soft Tissues: No acute fracture or aggressive appearing lytic or blastic osseous lesion.  CT ABDOMEN AND PELVIS  Abdomen: Unremarkable CT appearance of the stomach, duodenum, spleen, left adrenal gland and pancreas. 2.6 cm right adrenal nodule demonstrates internal Hounsfield units less than 10 consistent with a benign adenoma.  Normal hepatic contour and morphology. No discrete hepatic lesion. Gallbladder is unremarkable. No intra or extrahepatic biliary ductal dilatation.  No evidence of hydronephrosis or nephrolithiasis. Numerous parapelvic renal sinus cysts bilaterally. Additional circumscribed water attenuation cystic lesions in the kidneys most consistent with simple cysts. No enhancing renal mass.  Colonic diverticular disease without CT evidence of active inflammation. No evidence of obstruction or focal bowel wall thickening. Normal appendix in the right lower quadrant.  The terminal ileum is unremarkable. Calcification in the right upper quadrant adjacent to the hepatic flexure of the colon may represent a peritoneal mouse. This is of no clinical significance.  No free fluid or suspicious adenopathy.  Pelvis: Unremarkable uterus, bladder and at adnexa. No free fluid or suspicious adenopathy.  Musculoskeletal: Mild reticulation of the subcutaneous fat along the right anterolateral abdominal wall consistent with superficial contusion. No acute fracture or aggressive appearing lytic or blastic osseous lesion. Is advanced multilevel degenerative disc disease. Surgical changes of L3-L5 posterior lumbar interbody fusion. Grade 1 anterolisthesis of L4 on L5. Multilevel degenerative disc disease.  Vascular: Atherosclerotic vascular disease without significant stenosis or aneurysmal dilatation.  IMPRESSION: CT CHEST  1. No acute injury to the thorax. 2. Atherosclerosis including left main and 3 vessel coronary artery calcification. CT ABD/PELVIS  1. Superficial soft tissue contusion involving the anterolateral aspect of the right abdominal wall. Otherwise, no evidence of acute injury to the abdomen or pelvis. 2. Benign right adrenal adenoma. 3. Parapelvic renal sinus cysts bilaterally. 4. Colonic diverticular disease without CT evidence of active inflammation. 5. Multilevel degenerative disc disease status post L3-L5 posterior lumbar interbody fusion. 6. Grade 1 anterolisthesis of L4 on L5.   Electronically Signed  By: Malachy Moan M.D.  On: 04/19/2015 22:31  ____________________________________________   PROCEDURES  Procedure(s) performed: None  Critical Care performed: No  ____________________________________________   INITIAL IMPRESSION / ASSESSMENT AND PLAN / ED COURSE  Pertinent labs & imaging results that were available during my care of the patient were reviewed by me and considered in my medical decision  making (see chart for details).  82 year old with history of fall suffering contusions to the right chest wall, breast and right abdomen. CT of the chest, abdomen, pelvis were within normal limits as per above. She is instructed to hold Plavix until follow-up with her primary physician in 2 days. Her hemoglobin dropped from 12, about 6 months ago, down to 9 today  she is instructed to return to the emergency room for increasing pain, shortness of breath.  ____________________________________________   FINAL CLINICAL IMPRESSION(S) / ED DIAGNOSES  Final diagnoses:  Contusion      Ignacia Bayley, PA-C 04/19/15 2301  Sharman Cheek, MD 04/19/15 2317

## 2015-04-19 NOTE — Discharge Instructions (Signed)
Hold Plavix until seen by her primary physician in 2 days. Continue tramadol for pain relief. Return to the emergency room for increasing shortness of breath, or pain to the chest.

## 2015-04-19 NOTE — ED Notes (Signed)
Pt states she tripped up concrete steps, landed on right breast, and thighs. Pt states she has severe bruising to right breast, and thighs, is on blood thinner and was sent here for xrays and further eval. Pt can walk, appears in no distress. States taking a deep breath scares her because of the pain to her breast.

## 2015-04-19 NOTE — ED Notes (Signed)
Pt is on plavix 

## 2015-10-28 DIAGNOSIS — R0789 Other chest pain: Secondary | ICD-10-CM | POA: Insufficient documentation

## 2015-12-22 DIAGNOSIS — R0602 Shortness of breath: Secondary | ICD-10-CM

## 2015-12-22 HISTORY — DX: Shortness of breath: R06.02

## 2015-12-26 ENCOUNTER — Other Ambulatory Visit: Payer: Self-pay | Admitting: Internal Medicine

## 2015-12-26 DIAGNOSIS — Z1231 Encounter for screening mammogram for malignant neoplasm of breast: Secondary | ICD-10-CM

## 2016-01-31 ENCOUNTER — Ambulatory Visit
Admission: RE | Admit: 2016-01-31 | Discharge: 2016-01-31 | Disposition: A | Discharge status: Home / Self Care | Payer: Medicare Other | Source: Ambulatory Visit | Attending: Internal Medicine | Admitting: Internal Medicine

## 2016-01-31 DIAGNOSIS — R928 Other abnormal and inconclusive findings on diagnostic imaging of breast: Secondary | ICD-10-CM | POA: Insufficient documentation

## 2016-01-31 DIAGNOSIS — Z1231 Encounter for screening mammogram for malignant neoplasm of breast: Secondary | ICD-10-CM | POA: Diagnosis present

## 2016-02-02 ENCOUNTER — Other Ambulatory Visit: Payer: Self-pay | Admitting: Internal Medicine

## 2016-02-02 DIAGNOSIS — N6310 Unspecified lump in the right breast, unspecified quadrant: Secondary | ICD-10-CM

## 2016-02-02 DIAGNOSIS — R928 Other abnormal and inconclusive findings on diagnostic imaging of breast: Secondary | ICD-10-CM

## 2016-02-10 ENCOUNTER — Ambulatory Visit
Admission: RE | Admit: 2016-02-10 | Discharge: 2016-02-10 | Disposition: A | Discharge status: Home / Self Care | Payer: Medicare Other | Source: Ambulatory Visit | Attending: Internal Medicine | Admitting: Internal Medicine

## 2016-02-10 DIAGNOSIS — R928 Other abnormal and inconclusive findings on diagnostic imaging of breast: Secondary | ICD-10-CM

## 2016-02-10 DIAGNOSIS — N6310 Unspecified lump in the right breast, unspecified quadrant: Secondary | ICD-10-CM | POA: Diagnosis present

## 2016-02-14 ENCOUNTER — Other Ambulatory Visit: Payer: Self-pay | Admitting: Nurse Practitioner

## 2016-02-14 DIAGNOSIS — N63 Unspecified lump in unspecified breast: Secondary | ICD-10-CM

## 2016-04-03 DIAGNOSIS — I10 Essential (primary) hypertension: Secondary | ICD-10-CM | POA: Insufficient documentation

## 2016-08-13 ENCOUNTER — Ambulatory Visit
Admission: RE | Admit: 2016-08-13 | Discharge: 2016-08-13 | Disposition: A | Discharge status: Home / Self Care | Payer: Medicare Other | Source: Ambulatory Visit | Attending: Nurse Practitioner | Admitting: Nurse Practitioner

## 2016-08-13 DIAGNOSIS — N63 Unspecified lump in unspecified breast: Secondary | ICD-10-CM | POA: Diagnosis present

## 2016-08-13 DIAGNOSIS — N6311 Unspecified lump in the right breast, upper outer quadrant: Secondary | ICD-10-CM | POA: Diagnosis not present

## 2016-08-14 ENCOUNTER — Other Ambulatory Visit: Payer: Self-pay | Admitting: Nurse Practitioner

## 2016-08-14 DIAGNOSIS — N6310 Unspecified lump in the right breast, unspecified quadrant: Secondary | ICD-10-CM

## 2016-10-09 ENCOUNTER — Ambulatory Visit: Payer: Medicare Other | Attending: Internal Medicine | Admitting: Physical Therapy

## 2016-10-09 DIAGNOSIS — G8929 Other chronic pain: Secondary | ICD-10-CM | POA: Insufficient documentation

## 2016-10-09 DIAGNOSIS — M545 Low back pain: Secondary | ICD-10-CM | POA: Diagnosis present

## 2016-10-09 DIAGNOSIS — S76302A Unspecified injury of muscle, fascia and tendon of the posterior muscle group at thigh level, left thigh, initial encounter: Secondary | ICD-10-CM | POA: Diagnosis present

## 2016-10-09 DIAGNOSIS — X58XXXA Exposure to other specified factors, initial encounter: Secondary | ICD-10-CM | POA: Diagnosis not present

## 2016-10-09 NOTE — Therapy (Signed)
[facility] PHYSICAL AND SPORTS MEDICINE [address] [place], [place], [address] Phone: [phone]   Fax:  [phone]  Physical Therapy Evaluation  Patient Details  Name: Gail Paul MRN: [id number] Date of Birth: 1933/12/03 Referring Provider: Dr. Gayla MedicusKhan  Encounter Date: 10/09/2016      PT End of Session - 10/09/16 1511    Visit Number 1   Number of Visits 9   Date for PT Re-Evaluation 11/13/16   PT Start Time 1405   PT Stop Time 1505   PT Time Calculation (min) 60 min   Activity Tolerance Patient tolerated treatment well   Behavior During Therapy [facility] for tasks assessed/performed      Past Medical History:  Diagnosis Date  . CHF (congestive heart failure) (HCC)   . Heart attack (HCC)   . Hypertension     Past Surgical History:  Procedure Laterality Date  . BACK SURGERY    . carpel tunnel surgery    . CATARACT EXTRACTION      There were no vitals filed for this visit.       Subjective Assessment - 10/09/16 1425    Subjective Patient provides lengthy history of her medical history, it appears this recent episode of low back pain started after she was pushing a lawnmower and slipped and fell, she felt a tear in her posterior L thigh. She has had chronic low back pain since an injury while she was a child and has had at least 1 fusion surgery. Denies any symptoms down her legs.    Pertinent History History of falls, bilateral shoulder fx, L4-L5 fusion, 2 MIs. Multiple blockages on R side of heart. History of kidney injury, L shoulder sx, tendonitis of L forearm, history of R shoulder pain.     Limitations Sitting;Lifting;Walking;House hold activities   Diagnostic tests X-ray of spine (no acute osseus changes).    Currently in Pain? Yes   Pain Location Back   Pain Orientation Lower   Pain Descriptors / Indicators Aching   Pain Type Chronic pain   Pain Onset More than a month ago   Pain Frequency Intermittent             [facility] PT Assessment - 10/09/16 1525      Assessment   Medical Diagnosis DDD/LBP   Referring Provider Dr. Welton FlakesKhan   Onset Date/Surgical Date --  Roughly 4 weeks ago   Prior Therapy Multiple bouts of PT     Precautions   Precautions None     Restrictions   Weight Bearing Restrictions No     Balance Screen   Has the patient fallen in the past 6 months Yes   How many times? --  At least 1   Has the patient had a decrease in activity level because of a fear of falling?  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   Resisted flexion at full extension, at 90 degrees - very weak but non painful.   Glute max MMT < 3/5 bilaterally   Ely's to roughly 90 degrees bilaterally no pain in lumbar area  CPAs grade I - very hypomobile throughout lumbar spine but not reproductive of pain.   TherEx  Isometric HS curls at full extension - x 10 for 2 sets on LLE for 3-5" holds Educated patient on HS stretching with a towel in sitting x 30" x 2 bouts with cuing for proper technique/duration and symptoms while completing.        Objective measurements completed on examination: See above findings.                  PT Education - 10/09/16 1510    Education provided Yes   Education Details  Pain in her hip appears to be related to HS proximal tendon dysfunction, will monitor and provided stretching and isometric exercises.    Person(s) Educated Patient   Methods Explanation;Demonstration;Handout   Comprehension Verbalized understanding;Returned demonstration;Need further instruction             PT Long Term Goals - 10/09/16 1521      PT LONG TERM GOAL #1   Title Patient will report mODI of less than 60% to demonstrate improved tolerance for ADLs.    Time 5   Period Weeks   Status New   Target Date 11/13/16     PT LONG TERM GOAL #2   Title Patient will report worst pain of less than 6/10 to demonstrate improved tolerance for ADLs.    Baseline 10+   Time 5   Period Weeks   Status New   Target Date 11/13/16     PT LONG TERM GOAL #3   Title Patient will perform 5x sit to stand in less than 20" to demonstrate improved LE strength for decreased falls risk.    Baseline Did not complete    Time 5   Period Weeks   Status New   Target Date 11/13/16                Plan - 10/09/16 1515    Clinical Impression Statement Patient provides very convuluted history and could not provide specific answers for many questions asked by therapist. It appears she has chronic lower back pain, but has a sub-acute posterior thigh injury, consistent with a HS strain or rupture at the proximal tendon. Her tenderness is primarily over the ischial tuberosity, and this has appeared to alter her gait pattern leading to flare up of low back pain. She will be managed with isometrics and stretching initially with further progressions as indicated by response. Patient will likely benefit from skilled PT services to allow for return to functional ADLs.    Clinical Presentation Evolving   Clinical Decision Making Moderate   Rehab Potential Fair   PT Frequency 2x / week   PT Duration 4 weeks   PT Treatment/Interventions Aquatic Therapy;Electrical Stimulation;Cryotherapy;Passive range of  motion;Taping;Manual techniques;Dry needling;Neuromuscular re-education;Gait Network engineer;Iontophoresis 4mg /ml Dexamethasone;Traction;Ultrasound;Therapeutic exercise;Therapeutic activities;Balance training   PT Next Visit Plan Progress isometrics to eccentric HS work as indicated. Glute strengthening as able.  PT Home Exercise Plan Isometric HS and seated HS stretching.    Consulted and Agree with Plan of Care Patient      Patient will benefit from skilled therapeutic intervention in order to improve the following deficits and impairments:  Abnormal gait, Pain, Improper body mechanics, Decreased balance, Difficulty walking, Decreased strength, Decreased range of motion, Decreased activity tolerance, Decreased endurance, Cardiopulmonary status limiting activity  Visit Diagnosis: Left hamstring injury, initial encounter - Plan: PT plan of care cert/re-cert  Chronic midline low back pain without sciatica - Plan: PT plan of care cert/re-cert      G-Codes - 11/06/2016 1514    Functional Assessment Tool Used (Outpatient Only) mODI, observation    Functional Limitation Mobility: Walking and moving around   Mobility: Walking and Moving Around Current Status ([id number]) At least 40 percent but less than 60 percent impaired, limited or restricted   Mobility: Walking and Moving Around Goal Status [phone]) At least 20 percent but less than 40 percent impaired, limited or restricted       Problem List There are no active problems to display for this patient.  Alva Garnet PT, DPT, CSCS    06-Nov-2016, 3:28 PM  [place] [facility] PHYSICAL AND SPORTS MEDICINE [address] [place], [address] Phone: [phone]   Fax:  [phone]  Name: DIELLA GILLINGHAM MRN: [id number] Date of Birth: 02-20-1934

## 2016-10-09 NOTE — Patient Instructions (Signed)
BP - 122/60  HR - 54 bpm    mODI- 72%  Slump -- positive but appears to be due to HS length  HS MMT - 3-/5 on LLE and painful   All other MMT 3+/4- /5  Bilaterally   HS 90-90 VERY painful on LLE   Hip IR/ER ROM - WNL   Passive SLR - WNL bilaterally

## 2016-10-12 ENCOUNTER — Ambulatory Visit: Payer: Medicare Other | Admitting: Physical Therapy

## 2016-10-12 DIAGNOSIS — M545 Low back pain: Principal | ICD-10-CM

## 2016-10-12 DIAGNOSIS — S76302A Unspecified injury of muscle, fascia and tendon of the posterior muscle group at thigh level, left thigh, initial encounter: Secondary | ICD-10-CM

## 2016-10-12 DIAGNOSIS — G8929 Other chronic pain: Secondary | ICD-10-CM

## 2016-10-12 NOTE — Patient Instructions (Addendum)
Seated isometric HS at full extension, 90 degrees knee flexion   Nerve gliding and HS stretching in sitting   Piriformis stretch in sitting   Palpation over gluteal musculature  BP- 99/49  HR -61  Sidelying clamshells (unable to tolerate isometrics)  BP after 2 minutes- 91/57  Re-checked after providing water and switching arms - 116/64 HR - 63

## 2016-10-15 NOTE — Therapy (Signed)
[place] [facility] [address] [place], [address] Phone: [phone]   Fax:  [phone]  Physical Therapy Treatment  Patient Details  Name: Gail Paul MRN: [id number] Date of Birth: [birth year] Referring Provider: Dr. Gayla Medicus Date: 10/12/2016      PT End of Session - 10/15/16 1047    Visit Number 2   Number of Visits 9   Date for PT Re-Evaluation 11/13/16   PT Start Time 1143   PT Stop Time 1225   PT Time Calculation (min) 42 min   Activity Tolerance Patient limited by pain   Behavior During Therapy [facility] for tasks assessed/performed;Anxious      Past Medical History:  Diagnosis Date  . CHF (congestive heart failure) (HCC)   . Heart attack (HCC)   . Hypertension     Past Surgical History:  Procedure Laterality Date  . BACK SURGERY    . carpel tunnel surgery    . CATARACT EXTRACTION      There were no vitals filed for this visit.      Subjective Assessment - 10/15/16 1044    Subjective Patient reports her thigh was feeling better after the first session, but she has noticed R ankle swelling/pain she believes due to isometric exercises. She is also reporting increasing neck pain/discomfort, R shoulder pain, and her L thigh pain continues to be present. She also describes feeling quite emotionally depressed secondary to loss of family members and friends recently.    Pertinent History History of falls, bilateral shoulder fx, L4-L5 fusion, 2 MIs. Multiple blockages on R side of heart. History of kidney injury, L shoulder sx, tendonitis of L forearm, history of R shoulder pain.     Limitations Sitting;Lifting;Walking;House hold activities   Diagnostic tests X-ray of spine (no acute osseus changes).    Currently in Pain? Yes   Pain Location Back   Pain Orientation Lower   Pain Descriptors / Indicators Aching   Pain Type Chronic pain   Pain Onset More than a month ago   Pain Frequency  Intermittent      Seated isometric HS at full extension, 90 degrees knee flexion -- began to report cramping/discomfort in her LEs thus dc'd   Nerve gliding and HS stretching in sitting -- began to report pain/discomfort as well thus dc'd   Piriformis stretch in sitting -- no change in her symptoms, though very limited ROM tolerated.   Palpation over gluteal musculature and into the tendon region   BP- 99/49  HR -61  Sidelying clamshells (unable to tolerate isometrics) -- thus dc'd   BP after 2 minutes- 91/57  Re-checked after providing water and switching arms - 116/64 HR - 63                           PT Education - 10/15/16 1046    Education provided Yes   Education Details Use ice/heat and discontinue HEP over the weekend. Will call and try to find out what imaging has been done.    Person(s) Educated Patient   Methods Explanation   Comprehension Verbalized understanding             PT Long Term Goals - 10/09/16 1521      PT LONG TERM GOAL #1   Title Patient will report mODI of less than 60% to demonstrate improved tolerance for ADLs.    Time 5   Period  Weeks   Status New   Target Date 11/13/16     PT LONG TERM GOAL #2   Title Patient will report worst pain of less than 6/10 to demonstrate improved tolerance for ADLs.    Baseline 10+   Time 5   Period Weeks   Status New   Target Date 11/13/16     PT LONG TERM GOAL #3   Title Patient will perform 5x sit to stand in less than 20" to demonstrate improved LE strength for decreased falls risk.    Baseline Did not complete    Time 5   Period Weeks   Status New   Target Date 11/13/16               Plan - 10/15/16 1047    Clinical Impression Statement Patient is reporting moderate to severe pain in multiple areas of the body and is having difficulty getting into positions for therapist to perform manual interventions. She is having difficulty tolerating sitting, supine, prone,  and is too weak to complete standing level exercises currently. Given the above it has been quite difficult to find interventions she can tolerate that provide benefit for her as even low level isometrics are uncomfortable. There is some concern giving her ongoing hip pain she may have proximal HS dysfunction or avulsion, PT called MD office and left message to call back about previous imaging performed.    Clinical Presentation Evolving   Clinical Decision Making Moderate   Rehab Potential Fair   PT Frequency 2x / week   PT Duration 4 weeks   PT Treatment/Interventions Aquatic Therapy;Electrical Stimulation;Cryotherapy;Passive range of motion;Taping;Manual techniques;Dry needling;Neuromuscular re-education;Gait Network engineertraining;Stair training;Iontophoresis 4mg /ml Dexamethasone;Traction;Ultrasound;Therapeutic exercise;Therapeutic activities;Balance training   PT Next Visit Plan Progress isometrics to eccentric HS work as indicated. Glute strengthening as able.    PT Home Exercise Plan Isometric HS and seated HS stretching.    Consulted and Agree with Plan of Care Patient      Patient will benefit from skilled therapeutic intervention in order to improve the following deficits and impairments:  Abnormal gait, Pain, Improper body mechanics, Decreased balance, Difficulty walking, Decreased strength, Decreased range of motion, Decreased activity tolerance, Decreased endurance, Cardiopulmonary status limiting activity  Visit Diagnosis: Chronic midline low back pain without sciatica  Left hamstring injury, initial encounter     Problem List There are no active problems to display for this patient.  Alva GarnetPatrick Tracye Szuch PT, DPT, CSCS    10/15/2016, 10:52 AM  [facility] [address] [place], [place], [address] Phone: [phone]   Fax:  [phone]  Name: Gail Paul MRN: [id number] Date of Birth: 07-28-1933

## 2016-10-16 ENCOUNTER — Ambulatory Visit: Payer: Medicare Other | Admitting: Physical Therapy

## 2016-10-16 DIAGNOSIS — S76302A Unspecified injury of muscle, fascia and tendon of the posterior muscle group at thigh level, left thigh, initial encounter: Secondary | ICD-10-CM | POA: Diagnosis not present

## 2016-10-16 DIAGNOSIS — M545 Low back pain: Principal | ICD-10-CM

## 2016-10-16 DIAGNOSIS — G8929 Other chronic pain: Secondary | ICD-10-CM

## 2016-10-16 NOTE — Patient Instructions (Addendum)
Standing hip abductions   Single leg stance with bilateral HHA   Lateral walking at the TM   Seated ball squeeze with thighs (yellow therapy ball) x 6 repetitions, x 8 repetitions (5" holds) x 10 repetitions with 2" holds   Isometric HS curls in sitting on chair x 6 repetitions x 20" holds at low MVIC, x 6 repetitions (dc'd as she began having pain)   Isometric clamshells in sitting x 8 repetitions for 5" holds at moderate MVIC x 12 repetitions for 5" holds   Standing hamstring isometric curls with chair for HHA

## 2016-10-17 NOTE — Therapy (Signed)
[place] [facility] [address] [place], [place], [address] Phone: [phone]   Fax:  [phone]  Physical Therapy Treatment  Patient Details  Name: Gail Paul MRN: [id number] Date of Birth: [birth year] Referring Provider: Dr. Gayla MedicusKhan  Encounter Date: 10/16/2016      PT End of Session - 10/16/16 1720    Visit Number 3   Number of Visits 9   Date for PT Re-Evaluation 11/13/16   PT Start Time 1701   PT Stop Time 1741   PT Time Calculation (min) 40 min   Activity Tolerance Patient tolerated treatment well   Behavior [facility] for tasks assessed/performed;Anxious      Past Medical History:  Diagnosis Date  . CHF (congestive heart failure) (HCC)   . Heart attack (HCC)   . Hypertension     Past Surgical History:  Procedure Laterality Date  . BACK SURGERY    . carpel tunnel surgery    . CATARACT EXTRACTION      There were no vitals filed for this visit.      Subjective Assessment - 10/16/16 1704    Subjective Patient reports her R ankle is no longer swollen or bothering her. She reports her mid back has bothered her intermittently with walking. The L hip has been painful, but has not required pain medication. She reports if she sits for half an hour she gets pain.    Pertinent History History of falls, bilateral shoulder fx, L4-L5 fusion, 2 MIs. Multiple blockages on R side of heart. History of kidney injury, L shoulder sx, tendonitis of L forearm, history of R shoulder pain.     Limitations Sitting;Lifting;Walking;House hold activities   Diagnostic tests X-ray of spine (no acute osseus changes).    Currently in Pain? No/denies  Reports her back acts up on her, but nothing like it was.    Pain Onset --          Standing hip abductions x 5 (too challenging, thus dc'd)  Single leg stance with bilateral HHA x 3" holds bilaterally (dc'd as this was too painful for her posterior legs)   Lateral  walking at the TM x 3 laps with no complaints of pain, x 2 sets   Seated ball squeeze with thighs (yellow therapy ball) x 6 repetitions, x 8 repetitions (5" holds) x 10 repetitions with 2" holds   Isometric HS curls in sitting on chair x 6 repetitions x 20" holds at low MVIC, x 6 repetitions (dc'd as she began having pain)   Isometric clamshells in sitting x 8 repetitions for 5" holds at moderate MVIC x 12 repetitions for 5" holds   Standing hamstring isometric curls with chair for HHA x2 sets with cuing for technique.                         PT Education - 10/16/16 1720    Education provided Yes   Education Details Will progress from isometrics to concentrics as indicated by her response.    Person(s) Educated Patient   Methods Explanation   Comprehension Verbalized understanding             PT Long Term Goals - 10/09/16 1521      PT LONG TERM GOAL #1   Title Patient will report mODI of less than 60% to demonstrate improved tolerance for ADLs.    Time 5   Period Weeks   Status  New   Target Date 11/13/16     PT LONG TERM GOAL #2   Title Patient will report worst pain of less than 6/10 to demonstrate improved tolerance for ADLs.    Baseline 10+   Time 5   Period Weeks   Status New   Target Date 11/13/16     PT LONG TERM GOAL #3   Title Patient will perform 5x sit to stand in less than 20" to demonstrate improved LE strength for decreased falls risk.    Baseline Did not complete    Time 5   Period Weeks   Status New   Target Date 11/13/16               Plan - 10/16/16 1720    Clinical Impression Statement Patient reports she is feeling much better this date, she is unable to provide clear indication of her aggs/eases in symptoms and appears quite concerned on imaging findings from previous x-ray. She appears to tolerate isometrics well in all directions this date, though she is quite limited in what she can tolerate before complaints of  pain. Will attempt to progress her from isometrics to concentrics as tolerated.    Clinical Presentation Evolving   Clinical Decision Making Moderate   Rehab Potential Fair   PT Frequency 2x / week   PT Duration 4 weeks   PT Treatment/Interventions Aquatic Therapy;Electrical Stimulation;Cryotherapy;Passive range of motion;Taping;Manual techniques;Dry needling;Neuromuscular re-education;Gait Network engineer;Iontophoresis 4mg /ml Dexamethasone;Traction;Ultrasound;Therapeutic exercise;Therapeutic activities;Balance training   PT Next Visit Plan Progress isometrics to eccentric HS work as indicated. Glute strengthening as able.    PT Home Exercise Plan Isometric HS and seated HS stretching.    Consulted and Agree with Plan of Care Patient      Patient will benefit from skilled therapeutic intervention in order to improve the following deficits and impairments:  Abnormal gait, Pain, Improper body mechanics, Decreased balance, Difficulty walking, Decreased strength, Decreased range of motion, Decreased activity tolerance, Decreased endurance, Cardiopulmonary status limiting activity  Visit Diagnosis: Chronic midline low back pain without sciatica  Left hamstring injury, initial encounter     Problem List There are no active problems to display for this patient.  Alva Garnet PT, DPT, CSCS    10/17/2016, 9:59 PM  [place] [facility] PHYSICAL AND SPORTS MEDICINE [address] [place], [address] Phone: [phone]   Fax:  [phone]  Name: Gail Paul MRN: [id number] Date of Birth: 05-07-1933

## 2016-10-18 ENCOUNTER — Ambulatory Visit: Payer: Medicare Other | Admitting: Physical Therapy

## 2016-10-18 ENCOUNTER — Encounter: Payer: Self-pay | Admitting: Physical Therapy

## 2016-10-18 DIAGNOSIS — M545 Low back pain: Principal | ICD-10-CM

## 2016-10-18 DIAGNOSIS — G8929 Other chronic pain: Secondary | ICD-10-CM

## 2016-10-18 DIAGNOSIS — S76302A Unspecified injury of muscle, fascia and tendon of the posterior muscle group at thigh level, left thigh, initial encounter: Secondary | ICD-10-CM | POA: Diagnosis not present

## 2016-10-18 NOTE — Therapy (Signed)
[facility] [address] [place], [address] Phone: [phone]   Fax:  [phone]  Physical Therapy Treatment  Patient Details  Name: Gail Paul MRN: [id number] Date of Birth: [birth year] Referring Provider: Dr. Gayla Medicus Date: 10/18/2016      PT End of Session - 10/18/16 1514    Visit Number 4   Number of Visits 9   Date for PT Re-Evaluation 11/13/16   PT Start Time 1515   PT Stop Time 1555   PT Time Calculation (min) 40 min   Activity Tolerance Patient tolerated treatment well   Behavior During Therapy [facility] for tasks assessed/performed;Anxious      Past Medical History:  Diagnosis Date  . CHF (congestive heart failure) (HCC)   . Heart attack (HCC)   . Hypertension     Past Surgical History:  Procedure Laterality Date  . BACK SURGERY    . carpel tunnel surgery    . CATARACT EXTRACTION      There were no vitals filed for this visit.      Subjective Assessment - 10/18/16 1517    Subjective Pt reports that overall she is feeling better.  She is able to walk longer without sharp pain in her back (up to 30 minutes).  Pt reports she cannot sit longer than 30 minutes without pain in L gluteal region, this is an improvement since onset of symptoms, per pt.   Pertinent History History of falls, bilateral shoulder fx, L4-L5 fusion, 2 MIs. Multiple blockages on R side of heart. History of kidney injury, L shoulder sx, tendonitis of L forearm, history of R shoulder pain.     Limitations Sitting;Lifting;Walking;House hold activities   Diagnostic tests X-ray of spine (no acute osseus changes).    Currently in Pain? No/denies   Multiple Pain Sites No      TREATMENT   Standing hip abductions with BUEs supported on chair. 2x10 each LE. Cues to engage core throughout as pt reports low back pain at end of second set with RLE.   Single leg stance with single HHA. 2x30 seconds each LE. Cues to  engage core throughout.   Lateral walking with intermittent UE support 6 x35ft. Cues for slight knee bend Bil for greater glute recruitment. Pt reports pain and stiffness in lower back at end of exercise.   Seated prayer stretch on theraball x10 second holds x10. Pt reports improvement in low back stiffness and pain following.   Seated ball squeeze with thighs 4x20 second holds   Isometric HS curls in sitting on chair x10 seconds x10, pt denies pain.   Isometric clamshells in sitting x10 second holds x10, pt denies pain   Standing hamstring isometric curls with chair for HHA x5 with 10 second holds   Seated marching x40 each LE            PT Education - 10/18/16 1513    Education provided Yes   Education Details Exercise technique   Person(s) Educated Patient   Methods Explanation;Demonstration   Comprehension Verbalized understanding;Returned demonstration;Need further instruction             PT Long Term Goals - 10/09/16 1521      PT LONG TERM GOAL #1   Title Patient will report mODI of less than 60% to demonstrate improved tolerance for ADLs.    Time 5   Period Weeks   Status New   Target Date 11/13/16  PT LONG TERM GOAL #2   Title Patient will report worst pain of less than 6/10 to demonstrate improved tolerance for ADLs.    Baseline 10+   Time 5   Period Weeks   Status New   Target Date 11/13/16     PT LONG TERM GOAL #3   Title Patient will perform 5x sit to stand in less than 20" to demonstrate improved LE strength for decreased falls risk.    Baseline Did not complete    Time 5   Period Weeks   Status New   Target Date 11/13/16               Plan - 10/18/16 1536    Clinical Impression Statement Pt presents with improvement in overall pain in lower back and LLE.  She was able to tolerate therapeutic exercises today which will continue to be gradually progressed as pt can tolerate.  Provided pt with cues for core activation throughout  session today which improved lower back pain.  Pt reported relief with prayer stretch to lower back.   She will continue to benefit from continued skilled PT interventions for improved pain, strength, and functional mobility.   Rehab Potential Fair   PT Frequency 2x / week   PT Duration 4 weeks   PT Treatment/Interventions Aquatic Therapy;Electrical Stimulation;Cryotherapy;Passive range of motion;Taping;Manual techniques;Dry needling;Neuromuscular re-education;Gait Network engineertraining;Stair training;Iontophoresis 4mg /ml Dexamethasone;Traction;Ultrasound;Therapeutic exercise;Therapeutic activities;Balance training   PT Next Visit Plan Progress isometrics to eccentric HS work as indicated. Glute strengthening as able.    PT Home Exercise Plan Isometric HS and seated HS stretching.    Consulted and Agree with Plan of Care Patient      Patient will benefit from skilled therapeutic intervention in order to improve the following deficits and impairments:  Abnormal gait, Pain, Improper body mechanics, Decreased balance, Difficulty walking, Decreased strength, Decreased range of motion, Decreased activity tolerance, Decreased endurance, Cardiopulmonary status limiting activity  Visit Diagnosis: Chronic midline low back pain without sciatica  Left hamstring injury, initial encounter     Problem List There are no active problems to display for this patient.   Encarnacion ChuAshley Romell Cavanah PT, DPT 10/18/2016, 3:57 PM  [place] [facility] [address] [place], [place], [address] Phone: [phone]   Fax:  [phone]  Name: Gail Limana I Paul MRN: [id number] Date of Birth: Feb 29, 1934

## 2016-10-23 ENCOUNTER — Encounter: Payer: Self-pay | Admitting: Physical Therapy

## 2016-10-23 ENCOUNTER — Ambulatory Visit: Payer: Medicare Other | Admitting: Physical Therapy

## 2016-10-23 DIAGNOSIS — S76302A Unspecified injury of muscle, fascia and tendon of the posterior muscle group at thigh level, left thigh, initial encounter: Secondary | ICD-10-CM | POA: Diagnosis not present

## 2016-10-23 DIAGNOSIS — M545 Low back pain: Principal | ICD-10-CM

## 2016-10-23 DIAGNOSIS — G8929 Other chronic pain: Secondary | ICD-10-CM

## 2016-10-23 NOTE — Therapy (Signed)
[facility]ORTS MEDICINE [address], [place], [address] Phone: [phone]   Fax:  [phone]  Physical Therapy Treatment  Patient Details  Name: Gail Paul MRN: [id number] Date of Birth: Jul 20, 1933 Referring Provider: Dr. Gayla Medicus Date: 10/23/2016      PT End of Session - 10/23/16 1428    Visit Number 5   Number of Visits 9   Date for PT Re-Evaluation 11/13/16   PT Start Time 1430   PT Stop Time 1511   PT Time Calculation (min) 41 min   Activity Tolerance Patient tolerated treatment well   Behavior During Therapy [facility] for tasks assessed/performed;Anxious      Past Medical History:  Diagnosis Date  . CHF (congestive heart failure) (HCC)   . Heart attack (HCC)   . Hypertension     Past Surgical History:  Procedure Laterality Date  . BACK SURGERY    . carpel tunnel surgery    . CATARACT EXTRACTION      There were no vitals filed for this visit.      Subjective Assessment - 10/23/16 1437    Subjective Pt brought her X-ray report for her lumbar spine (see diagnositic test below).  Pt reports her LLE has been "definitely so much better since last session".  She says she has been very busy over the past few days so her back has been sore.  She reports she is no longer taking pain medication.  Pt has been completing her HEP without questions or concerns.    Pertinent History History of falls, bilateral shoulder fx, L4-L5 fusion, 2 MIs. Multiple blockages on R side of heart. History of kidney injury, L shoulder sx, tendonitis of L forearm, history of R shoulder pain.     Limitations Sitting;Lifting;Walking;House hold activities   Diagnostic tests X-ray lumbar spine [id number], 2018: extensive postsurgical changes are noted at L3-4 and L4-5 with intervertebral disc spacer device at L4-5 and rods and pedicle screws at L3-4 and L5.  Mild SI joint arthropathy.  Prior laminectomy at L4.  Marked disc space  narrowing T12-L1 and L1-2 and L2-3.  No compression fxs lytic or blastic lesions.  Minimal aortic calcification.     Currently in Pain? Yes   Pain Score 4    Pain Location --  buttocks   Pain Orientation Left   Pain Descriptors / Indicators Aching   Pain Frequency Intermittent   Multiple Pain Sites No       TREATMENT  Posterior pelvic tilts with 10 second holds with verbal and tactile cues and demonstration. x10 in standing against wall.   Lateral walking with intermittent UE support 6 x33ft. Cues for slight knee bend Bil for greater glute recruitment.   Seated prayer stretch on theraball x10 second holds x10. Pt reports improvement in low back stiffness and pain following.   Seated ball squeeze with thighs 10x10 second holds   Isometric HS curls in sitting on chair x10 seconds x10, pt reports mild soreness in L buttocks.   Isometric clamshells in sitting x10 second holds x10, pt denies pain   Standing hamstring isometric with chair for HHA x5 with 10 second holds   Seated marching x40 each LE with cues for core engagement throughout.   Palpated proximal insertion site of L HS and pt denied any pain.             PT Education - 10/23/16 1428    Education provided Yes  Education Details Exercise technique   Person(s) Educated Patient   Methods Explanation;Demonstration   Comprehension Verbalized understanding;Returned demonstration;Need further instruction             PT Long Term Goals - 10/09/16 1521      PT LONG TERM GOAL #1   Title Patient will report mODI of less than 60% to demonstrate improved tolerance for ADLs.    Time 5   Period Weeks   Status New   Target Date 11/13/16     PT LONG TERM GOAL #2   Title Patient will report worst pain of less than 6/10 to demonstrate improved tolerance for ADLs.    Baseline 10+   Time 5   Period Weeks   Status New   Target Date 11/13/16     PT LONG TERM GOAL #3   Title Patient will perform 5x sit to stand  in less than 20" to demonstrate improved LE strength for decreased falls risk.    Baseline Did not complete    Time 5   Period Weeks   Status New   Target Date 11/13/16               Plan - 10/23/16 1454    Clinical Impression Statement Pt reports an overall improvement in L buttocks and low back pain since starting physical therapy although she does report continued soreness with light isometric L hamstring exercises.  She is not TTP at proximal HS insertion site and suspect that initial injurt was a strain rather than a complete avulsion.  Will continue to monitor.  Initiated posterior pelvic tilts today with focus on engaging core to address low back pain which pt was able to complete correctly after verbal and tactile cues and demonstration.  She will continue to benefit from skilled PT interventions for improved posture, strength, and to decrease pain.     Rehab Potential Fair   PT Frequency 2x / week   PT Duration 4 weeks   PT Treatment/Interventions Aquatic Therapy;Electrical Stimulation;Cryotherapy;Passive range of motion;Taping;Manual techniques;Dry needling;Neuromuscular re-education;Gait Network engineer;Iontophoresis 4mg /ml Dexamethasone;Traction;Ultrasound;Therapeutic exercise;Therapeutic activities;Balance training   PT Next Visit Plan Progress isometrics to eccentric HS work as indicated. Glute strengthening as able.    PT Home Exercise Plan Isometric HS and seated HS stretching.    Consulted and Agree with Plan of Care Patient      Patient will benefit from skilled therapeutic intervention in order to improve the following deficits and impairments:  Abnormal gait, Pain, Improper body mechanics, Decreased balance, Difficulty walking, Decreased strength, Decreased range of motion, Decreased activity tolerance, Decreased endurance, Cardiopulmonary status limiting activity  Visit Diagnosis: Chronic midline low back pain without sciatica  Left hamstring injury,  initial encounter     Problem List There are no active problems to display for this patient.   Encarnacion Chu PT, DPT 10/23/2016, 3:13 PM  [facility] PHYSICAL AND SPORTS MEDICINE [address] [place], [address] Phone: [phone]   Fax:  [phone]  Name: AZRIEL DANCY MRN: [id number] Date of Birth: 02/22/1934

## 2016-10-25 ENCOUNTER — Ambulatory Visit: Payer: Medicare Other | Admitting: Physical Therapy

## 2016-10-25 ENCOUNTER — Encounter: Payer: Self-pay | Admitting: Physical Therapy

## 2016-10-25 DIAGNOSIS — S76302A Unspecified injury of muscle, fascia and tendon of the posterior muscle group at thigh level, left thigh, initial encounter: Secondary | ICD-10-CM | POA: Diagnosis not present

## 2016-10-25 DIAGNOSIS — G8929 Other chronic pain: Secondary | ICD-10-CM

## 2016-10-25 DIAGNOSIS — M545 Low back pain, unspecified: Secondary | ICD-10-CM

## 2016-10-25 NOTE — Therapy (Signed)
[place] [facility] PHYS[facility] SPORTS MEDICINE [address] [place], [address] Phone: [phone]   Fax:  [phone]  Physical Therapy Treatment  Patient Details  Name: Gail Paul MRN: [id number] Date of Birth: [birth year] Referring Provider: Dr. Gayla Medicus Date: 10/25/2016      PT End of Session - 10/25/16 1428    Visit Number 6   Number of Visits 9   Date for PT Re-Evaluation 11/13/16   PT Start Time 1428   PT Stop Time 1507   PT Time Calculation (min) 39 min   Activity Tolerance Patient tolerated treatment well   Behavior During Therapy [facility] for tasks assessed/performed;Anxious      Past Medical History:  Diagnosis Date  . CHF (congestive heart failure) (HCC)   . Heart attack (HCC)   . Hypertension     Past Surgical History:  Procedure Laterality Date  . BACK SURGERY    . carpel tunnel surgery    . CATARACT EXTRACTION      There were no vitals filed for this visit.      Subjective Assessment - 10/25/16 1431    Subjective Pt reports she had intense soreness in her lower back following last session which she attributes to her pelvic tilt exercise.  Pt reports her L glute is feeling much better, has only had 1-2 episodes of shooting pain since last session which is an improvement.    Pertinent History History of falls, bilateral shoulder fx, L4-L5 fusion, 2 MIs. Multiple blockages on R side of heart. History of kidney injury, L shoulder sx, tendonitis of L forearm, history of R shoulder pain.     Limitations Sitting;Lifting;Walking;House hold activities   Diagnostic tests X-ray lumbar spine [id number], 2018: extensive postsurgical changes are noted at L3-4 and L4-5 with intervertebral disc spacer device at L4-5 and rods and pedicle screws at L3-4 and L5.  Mild SI joint arthropathy.  Prior laminectomy at L4.  Marked disc space narrowing T12-L1 and L1-2 and L2-3.  No compression fxs lytic or blastic lesions.  Minimal aortic  calcification.     Currently in Pain? No/denies      TREATMENT   Posterior pelvic tilts with 10 second holds with verbal and tactile cues and demonstration. x10 in sitting. Pt denies pain with this.   Bil hip Abd/ER with RTB around knees 2x15 (added to HEP and provided handout)   Lateral walking with intermittent UE support 6 x67ft. Cues for slight knee bend Bil for greater glute recruitment.   Red theraband around knees. Pt reports moderate stiffness in lower back.   Seated prayer stretch on theraball x10 second holds x10. Pt reports improvement in low back stiffness and pain following.   Seated ball squeeze with thighs 10x10 second holds   Isometric HS curls in sitting on chair x10 seconds x10, reports mild soreness in buttocks following.   Standing hamstring isometric with chair for HHA x5 with 10 second holds   Seated marching x40 each LE with red theraband around knees with cues for core engagement throughout. (added to HEP and provided handout)   Forward step ups to airex x20 each LE.   Lateral step ups and over to airex x20 each direction  Mini squats with BUE support x10          PT Education - 10/25/16 1428    Education provided Yes   Education Details Exercise technique; HEP   Person(s) Educated Patient   Methods Explanation;Demonstration;Verbal cues;Handout  Comprehension Returned demonstration;Verbalized understanding;Need further instruction;Verbal cues required             PT Long Term Goals - 10/09/16 1521      PT LONG TERM GOAL #1   Title Patient will report mODI of less than 60% to demonstrate improved tolerance for ADLs.    Time 5   Period Weeks   Status New   Target Date 11/13/16     PT LONG TERM GOAL #2   Title Patient will report worst pain of less than 6/10 to demonstrate improved tolerance for ADLs.    Baseline 10+   Time 5   Period Weeks   Status New   Target Date 11/13/16     PT LONG TERM GOAL #3   Title Patient will  perform 5x sit to stand in less than 20" to demonstrate improved LE strength for decreased falls risk.    Baseline Did not complete    Time 5   Period Weeks   Status New   Target Date 11/13/16               Plan - 10/25/16 1451    Clinical Impression Statement Continued to focus energy on engaging core throughout strengthening exercises.  Progressed LE strengthening exercises and provided pt with handout for HEP to be completed at home.  She responded well to interventions this date.  Will need to continue working on technique for pelvic tilts with proper activation or core.  Pt will benefit from continued skilled PT interventions for improved strength, flexibility, mobility, and decreased pain.   Rehab Potential Fair   PT Frequency 2x / week   PT Duration 4 weeks   PT Treatment/Interventions Aquatic Therapy;Electrical Stimulation;Cryotherapy;Passive range of motion;Taping;Manual techniques;Dry needling;Neuromuscular re-education;Gait Network engineer;Iontophoresis 4mg /ml Dexamethasone;Traction;Ultrasound;Therapeutic exercise;Therapeutic activities;Balance training   PT Next Visit Plan Progress isometrics to eccentric HS work as indicated. Glute strengthening as able.    PT Home Exercise Plan Isometric HS and seated HS stretching.    Consulted and Agree with Plan of Care Patient      Patient will benefit from skilled therapeutic intervention in order to improve the following deficits and impairments:  Abnormal gait, Pain, Improper body mechanics, Decreased balance, Difficulty walking, Decreased strength, Decreased range of motion, Decreased activity tolerance, Decreased endurance, Cardiopulmonary status limiting activity  Visit Diagnosis: Chronic midline low back pain without sciatica  Left hamstring injury, initial encounter     Problem List There are no active problems to display for this patient.   Encarnacion Chu PT, DPT 10/25/2016, 3:09 PM  [facility] PHYSICAL AND SPORTS MEDICINE [address], [place], [address] Phone: [phone]   Fax:  [phone]  Name: Gail Paul MRN: [id number] Date of Birth: [birth year]

## 2016-10-30 ENCOUNTER — Ambulatory Visit: Payer: Medicare Other

## 2016-10-30 DIAGNOSIS — S76302A Unspecified injury of muscle, fascia and tendon of the posterior muscle group at thigh level, left thigh, initial encounter: Secondary | ICD-10-CM | POA: Diagnosis not present

## 2016-10-30 DIAGNOSIS — M545 Low back pain, unspecified: Secondary | ICD-10-CM

## 2016-10-30 DIAGNOSIS — G8929 Other chronic pain: Secondary | ICD-10-CM

## 2016-10-30 NOTE — Therapy (Signed)
[place] [facility] PHYSICAL [facility]ORTS MEDICINE [address] [place], [address] Phone: [phone]   Fax:  [phone]  Physical Therapy Treatment  Patient Details  Name: Gail Paul MRN: [id number] Date of Birth: 1933/05/07 Referring Provider: Dr. Gayla Medicus Date: 10/30/2016      PT End of Session - 10/31/16 0807    Visit Number 7   Number of Visits 9   Date for PT Re-Evaluation 11/13/16   PT Start Time 1435   PT Stop Time 1520   PT Time Calculation (min) 45 min   Activity Tolerance Patient tolerated treatment well   Behavior During Therapy [facility] for tasks assessed/performed;Anxious      Past Medical History:  Diagnosis Date  . CHF (congestive heart failure) (HCC)   . Heart attack (HCC)   . Hypertension     Past Surgical History:  Procedure Laterality Date  . BACK SURGERY    . carpel tunnel surgery    . CATARACT EXTRACTION      There were no vitals filed for this visit.      Subjective Assessment - 10/30/16 1436    Subjective Pt reports that she got a flat tire on her riding mower this past weekend. She bent down to fill up the tire and could barely get up due to back pain. Pt reports that the pain has improved but she still has some soreness. Pt reports that her leg pain is 90% better but her back is very sore.    Pertinent History History of falls, bilateral shoulder fx, L4-L5 fusion, 2 MIs. Multiple blockages on R side of heart. History of kidney injury, L shoulder sx, tendonitis of L forearm, history of R shoulder pain.     Limitations Sitting;Lifting;Walking;House hold activities   Diagnostic tests X-ray lumbar spine [id number], 2018: extensive postsurgical changes are noted at L3-4 and L4-5 with intervertebral disc spacer device at L4-5 and rods and pedicle screws at L3-4 and L5.  Mild SI joint arthropathy.  Prior laminectomy at L4.  Marked disc space narrowing T12-L1 and L1-2 and L2-3.  No compression fxs lytic or blastic  lesions.  Minimal aortic calcification.     Currently in Pain? Yes   Pain Score 5    Pain Location Back   Pain Orientation Right;Left   Pain Descriptors / Indicators Sore   Pain Type Acute pain  acute on chronic pain   Pain Onset In the past 7 days   Pain Frequency Intermittent               ?   TREATMENT   Ther-ex Posterior pelvic tilts with 3-5 second holds with verbal and tactile cues and demonstration x 10 in sitting. Pt denies pain with this.  Seated prayer stretch in sitting with theraball x 3 second holds x 10 forward, x 10 with L bias, x 10 with R bias; Isometric HS curls and glut sets in sitting on chair 5s hold, 2 x 10 reports mild soreness in buttocks following.  Seated adductor isometric ball squeeze with thighs 5s hold x 10;  Verbal and tactile cues required throughout exercise  E-stim Hivolt e-stim to lower lumbar parspinals x 10 minutes to pt tolerance (265V on L, 280V on R) with moist head pack, no skin irritation following and pt reports relief of pain but does not rate on NPRS.  PT Education - 10/31/16 0806    Education provided Yes   Education Details Reinforced plan of care and HEP   Person(s) Educated Patient   Methods Explanation   Comprehension Verbalized understanding             PT Long Term Goals - 10/09/16 1521      PT LONG TERM GOAL #1   Title Patient will report mODI of less than 60% to demonstrate improved tolerance for ADLs.    Time 5   Period Weeks   Status New   Target Date 11/13/16     PT LONG TERM GOAL #2   Title Patient will report worst pain of less than 6/10 to demonstrate improved tolerance for ADLs.    Baseline 10+   Time 5   Period Weeks   Status New   Target Date 11/13/16     PT LONG TERM GOAL #3   Title Patient will perform 5x sit to stand in less than 20" to demonstrate improved LE strength for decreased falls risk.    Baseline Did not complete    Time 5   Period Weeks    Status New   Target Date 11/13/16               Plan - 10/31/16 0807    Clinical Impression Statement Pt reporting increase in low back pain today following bending over this weekend to pump up her mower tire. After some seated exercises attempted to move to standing exercises in gym but pt reports high levels of pain upon standing and walking. Utilized Hivolt e-stim with significant improvement in patient's low back pain following. Pt does not rate her pain following estim. Pt encouraged to continue HEP and follow-up as scheduled.    Clinical Presentation Unstable   Clinical Decision Making Moderate   Rehab Potential Fair   PT Frequency 2x / week   PT Duration 4 weeks   PT Treatment/Interventions Aquatic Therapy;Electrical Stimulation;Cryotherapy;Passive range of motion;Taping;Manual techniques;Dry needling;Neuromuscular re-education;Gait Network engineer;Iontophoresis 4mg /ml Dexamethasone;Traction;Ultrasound;Therapeutic exercise;Therapeutic activities;Balance training   PT Next Visit Plan Progress isometrics to eccentric HS work as indicated. Glute strengthening as able.    PT Home Exercise Plan Isometric HS and seated HS stretching.    Consulted and Agree with Plan of Care Patient      Patient will benefit from skilled therapeutic intervention in order to improve the following deficits and impairments:  Abnormal gait, Pain, Improper body mechanics, Decreased balance, Difficulty walking, Decreased strength, Decreased range of motion, Decreased activity tolerance, Decreased endurance, Cardiopulmonary status limiting activity  Visit Diagnosis: Chronic midline low back pain without sciatica  Left hamstring injury, initial encounter     Problem List There are no active problems to display for this patient.  Lynnea Maizes PT, DPT   Huprich,Jason 10/31/2016, 8:29 AM  [facility] PHYSICAL AND SPORTS MEDICINE [address], [place], [address] Phone: [phone]   Fax:  [phone]  Name: Gail Paul MRN: [id number] Date of Birth: 09-25-1933

## 2016-11-01 ENCOUNTER — Ambulatory Visit: Payer: Medicare Other

## 2016-11-01 DIAGNOSIS — S76302A Unspecified injury of muscle, fascia and tendon of the posterior muscle group at thigh level, left thigh, initial encounter: Secondary | ICD-10-CM | POA: Diagnosis not present

## 2016-11-01 DIAGNOSIS — G8929 Other chronic pain: Secondary | ICD-10-CM

## 2016-11-01 DIAGNOSIS — M545 Low back pain: Principal | ICD-10-CM

## 2016-11-01 NOTE — Therapy (Signed)
[facility] [facility] PHYS[facility]TS MEDICINE [address] [place], [place], [address] Phone: [phone]   Fax:  [phone]  Physical Therapy Treatment  Patient Details  Name: Gail Paul MRN: [id number] Date of Birth: 12-30-1933 Referring Provider: Dr. Gayla MedicusKhan  Encounter Date: 11/01/2016      PT End of Session - 11/01/16 1356    Visit Number 8   Number of Visits 9   Date for PT Re-Evaluation 11/13/16   PT Start Time 1400   PT Stop Time 1445   PT Time Calculation (min) 45 min   Activity Tolerance Patient tolerated treatment well   Behavior [facility] Therapy [facility] for tasks assessed/performed      Past Medical History:  Diagnosis Date  . CHF (congestive heart failure) (HCC)   . Heart attack (HCC)   . Hypertension     Past Surgical History:  Procedure Laterality Date  . BACK SURGERY    . carpel tunnel surgery    . CATARACT EXTRACTION      There were no vitals filed for this visit.      Subjective Assessment - 11/01/16 1356    Subjective Pt statres that she was very sore the night following her last therapy session and the following day. She reports 6/10 R sided LBP today. She is concerned that maybe her exercises are too strenuous.    Pertinent History History of falls, bilateral shoulder fx, L4-L5 fusion, 2 MIs. Multiple blockages on R side of heart. History of kidney injury, L shoulder sx, tendonitis of L forearm, history of R shoulder pain.     Limitations Sitting;Lifting;Walking;House hold activities   Diagnostic tests X-ray lumbar spine [id number], 2018: extensive postsurgical changes are noted at L3-4 and L4-5 with intervertebral disc spacer device at L4-5 and rods and pedicle screws at L3-4 and L5.  Mild SI joint arthropathy.  Prior laminectomy at L4.  Marked disc space narrowing T12-L1 and L1-2 and L2-3.  No compression fxs lytic or blastic lesions.  Minimal aortic calcification.     Currently in Pain? Yes   Pain Score 6    Pain  Location Back   Pain Orientation Right   Pain Descriptors / Indicators Sore   Pain Type Acute pain   Pain Onset More than a month ago   Pain Frequency Intermittent           TREATMENT   Ther-ex  Heat pack applied to lumbar spine x 5 minutes during history; Seated marches 2 x 10 bilateral; Seated adductor isometric ball squeeze with thighs 5s hold 2 x 10;  Seated isometric clams 5s hold 2 x 10; Isometric L HS curls in sitting on chair 5s hold, 2 x 10 heavy verbal and tactile cues required to achieve an appropriate contraction, avoided glut sets today due to reports of increased back pain following last session;  Manual Therapy STM to R lumbar paraspinals x 10 min Cold pack applied to bilateral lumbar spine x 5 minutes at end of session (unbilled);                      PT Education - 11/01/16 1356    Education provided Yes   Education Details Plan of care, exercise form/technique   Person(s) Educated Patient   Methods Explanation   Comprehension Verbalized understanding             PT Long Term Goals - 10/09/16 1521      PT LONG TERM GOAL #  1   Title Patient will report mODI of less than 60% to demonstrate improved tolerance for ADLs.    Time 5   Period Weeks   Status New   Target Date 11/13/16     PT LONG TERM GOAL #2   Title Patient will report worst pain of less than 6/10 to demonstrate improved tolerance for ADLs.    Baseline 10+   Time 5   Period Weeks   Status New   Target Date 11/13/16     PT LONG TERM GOAL #3   Title Patient will perform 5x sit to stand in less than 20" to demonstrate improved LE strength for decreased falls risk.    Baseline Did not complete    Time 5   Period Weeks   Status New   Target Date 11/13/16               Plan - 11/01/16 1357    Clinical Impression Statement Pt provided encouragement that her exercises are not too strenous. She is mostly just performing isometric exercises. She struggles  today to perform good isometric contraction of L hamstrings but with extensive verbal and tactile cues she is able to complete. STM to R lumbar spine improves her pain and notable decrease in guarding following. Pt encouraged to continue HEP and follow-up as scheduled.    Rehab Potential Fair   PT Frequency 2x / week   PT Duration 4 weeks   PT Treatment/Interventions Aquatic Therapy;Electrical Stimulation;Cryotherapy;Passive range of motion;Taping;Manual techniques;Dry needling;Neuromuscular re-education;Gait Network engineer;Iontophoresis 4mg /ml Dexamethasone;Traction;Ultrasound;Therapeutic exercise;Therapeutic activities;Balance training   PT Next Visit Plan Progress isometrics to eccentric HS work as indicated. Glute strengthening as able.    PT Home Exercise Plan Isometric HS and seated HS stretching.    Consulted and Agree with Plan of Care Patient      Patient will benefit from skilled therapeutic intervention in order to improve the following deficits and impairments:  Abnormal gait, Pain, Improper body mechanics, Decreased balance, Difficulty walking, Decreased strength, Decreased range of motion, Decreased activity tolerance, Decreased endurance, Cardiopulmonary status limiting activity  Visit Diagnosis: Chronic midline low back pain without sciatica  Left hamstring injury, initial encounter     Problem List There are no active problems to display for this patient.  Gail Paul PT, DPT   Gail Paul,Gail Paul 11/01/2016, 5:24 PM  [facility] PHYSICAL AND SPORTS MEDICINE [address], [place], [address] Phone: [phone]   Fax:  [phone]  Name: Gail Paul MRN: [id number] Date of Birth: [birth year]

## 2016-11-06 ENCOUNTER — Ambulatory Visit: Payer: Medicare Other | Attending: Internal Medicine | Admitting: Physical Therapy

## 2016-11-06 DIAGNOSIS — M545 Low back pain, unspecified: Secondary | ICD-10-CM

## 2016-11-06 DIAGNOSIS — S76302A Unspecified injury of muscle, fascia and tendon of the posterior muscle group at thigh level, left thigh, initial encounter: Secondary | ICD-10-CM | POA: Insufficient documentation

## 2016-11-06 DIAGNOSIS — X58XXXA Exposure to other specified factors, initial encounter: Secondary | ICD-10-CM | POA: Diagnosis not present

## 2016-11-06 DIAGNOSIS — G8929 Other chronic pain: Secondary | ICD-10-CM | POA: Diagnosis present

## 2016-11-06 NOTE — Patient Instructions (Signed)
Sidelying soft tissue mobilizations around L paraspinals in lumbar area, wrapping around to L flank   Standing trunk extensions holding on to TM  Prone on elbows

## 2016-11-07 NOTE — Therapy (Signed)
[facility]INE [address] [place], [address] Phone: [phone]   Fax:  [phone]  Physical Therapy Treatment  Patient Details  Name: Gail Paul MRN: [id number] Date of Birth: 02/20/1934 Referring Provider: Dr. Gayla Medicus Date: 11/06/2016      PT End of Session - 11/06/16 1524    Visit Number 9   Number of Visits 17   Date for PT Re-Evaluation 12/04/16   PT Start Time 1452   PT Stop Time 1530   PT Time Calculation (min) 38 min   Activity Tolerance Patient tolerated treatment well   Behavior During Therapy [facility] for tasks assessed/performed      Past Medical History:  Diagnosis Date  . CHF (congestive heart failure) (HCC)   . Heart attack (HCC)   . Hypertension     Past Surgical History:  Procedure Laterality Date  . BACK SURGERY    . carpel tunnel surgery    . CATARACT EXTRACTION      There were no vitals filed for this visit.      Subjective Assessment - 11/06/16 1454    Subjective (P)  Patient reports she was in very severe pain last week, she reports when she does the exercises she is fine but afterwards she is in severe pain. Patient reports her L posterior hip pain is about 90 percent improved. She reports her back is quite tight (upper and lower). Her primary complaint today is pain/stiffness in her back (reports top to bottom).    Pertinent History (P)  History of falls, bilateral shoulder fx, L4-L5 fusion, 2 MIs. Multiple blockages on R side of heart. History of kidney injury, L shoulder sx, tendonitis of L forearm, history of R shoulder pain.     Limitations (P)  Sitting;Lifting;Walking;House hold activities   Diagnostic tests (P)  X-ray lumbar spine [id number], 2018: extensive postsurgical changes are noted at L3-4 and L4-5 with intervertebral disc spacer device at L4-5 and rods and pedicle screws at L3-4 and L5.  Mild SI joint arthropathy.  Prior laminectomy at L4.  Marked disc  space narrowing T12-L1 and L1-2 and L2-3.  No compression fxs lytic or blastic lesions.  Minimal aortic calcification.     Currently in Pain? (P)  Yes   Pain Score (P)  --  Reports her back is usually severe pain, gets tighter and tighter from activity.    Pain Location (P)  Back   Pain Onset (P)  More than a month ago      Sidelying soft tissue mobilizations around L paraspinals in lumbar area, wrapping around to L flank -- patient began limping and reported intense spasming while standing after completion, significantly altered her gait pattern afterwards.   Standing trunk extensions holding on to TM x 15 -- patient required tactile cuing throughout this to complete correctly -- reports very mild relief of symptoms.   Prone on elbows x 5 minutes with breaks included -- no change in symptoms noted, patient was still demonstrating antalgic gait pattern.   At this point dc'd session and provided moist hot pack while patient waited in the lobby Dustin Flock)                            PT Education - 11/06/16 1524    Education provided Yes   Education Details Will continue to look for stretching that helps alleviate her symptoms.    Person(s)  Educated Patient   Methods Explanation   Comprehension Verbalized understanding             PT Long Term Goals - 10/09/16 1521      PT LONG TERM GOAL #1   Title Patient will report mODI of less than 60% to demonstrate improved tolerance for ADLs.    Time 5   Period Weeks   Status New   Target Date 11/13/16     PT LONG TERM GOAL #2   Title Patient will report worst pain of less than 6/10 to demonstrate improved tolerance for ADLs.    Baseline 10+   Time 5   Period Weeks   Status New   Target Date 11/13/16     PT LONG TERM GOAL #3   Title Patient will perform 5x sit to stand in less than 20" to demonstrate improved LE strength for decreased falls risk.    Baseline Did not complete    Time 5   Period Weeks    Status New   Target Date 11/13/16               Plan - 11/06/16 1524    Clinical Impression Statement Patient demonstrates severe antalgic gait pattern and spasming after soft tissue mobilization in sidelying. She has had improvement in her buttock pain, but continues to be limited by lumbar muscular spasms. Initially she presented with normal gait pattern today. PT will continue to assess various treatment techniques (perhaps extension based after observations in session today) to relieve her symptoms.    Clinical Presentation Evolving   Clinical Decision Making Moderate   Rehab Potential Fair   PT Frequency 2x / week   PT Duration 4 weeks   PT Treatment/Interventions Aquatic Therapy;Electrical Stimulation;Cryotherapy;Passive range of motion;Taping;Manual techniques;Dry needling;Neuromuscular re-education;Gait Network engineertraining;Stair training;Iontophoresis 4mg /ml Dexamethasone;Traction;Ultrasound;Therapeutic exercise;Therapeutic activities;Balance training   PT Next Visit Plan Progress isometrics to eccentric HS work as indicated. Glute strengthening as able.    PT Home Exercise Plan Isometric HS and seated HS stretching.    Consulted and Agree with Plan of Care Patient      Patient will benefit from skilled therapeutic intervention in order to improve the following deficits and impairments:  Abnormal gait, Pain, Improper body mechanics, Decreased balance, Difficulty walking, Decreased strength, Decreased range of motion, Decreased activity tolerance, Decreased endurance, Cardiopulmonary status limiting activity  Visit Diagnosis: Chronic midline low back pain without sciatica  Left hamstring injury, initial encounter       G-Codes - 11/07/16 1046    Functional Assessment Tool Used (Outpatient Only) mODI, observation    Functional Limitation Mobility: Walking and moving around   Mobility: Walking and Moving Around Current Status [phone]([id number]) At least 20 percent but less than 40 percent  impaired, limited or restricted   Mobility: Walking and Moving Around Goal Status [phone]([phone]) At least 20 percent but less than 40 percent impaired, limited or restricted      Problem List There are no active problems to display for this patient.  Alva GarnetPatrick McNamara PT, DPT, CSCS    11/07/2016, 10:47 AM  [place] [facility] [address] [place], [place], [address] Phone: [phone]   Fax:  [phone]  Name: Gail Paul MRN: [id number] Date of Birth: March 03, 1934

## 2016-11-08 ENCOUNTER — Ambulatory Visit: Payer: Medicare Other | Admitting: Physical Therapy

## 2016-11-08 DIAGNOSIS — M545 Low back pain, unspecified: Secondary | ICD-10-CM

## 2016-11-08 DIAGNOSIS — G8929 Other chronic pain: Secondary | ICD-10-CM

## 2016-11-08 DIAGNOSIS — S76302A Unspecified injury of muscle, fascia and tendon of the posterior muscle group at thigh level, left thigh, initial encounter: Secondary | ICD-10-CM

## 2016-11-08 NOTE — Patient Instructions (Signed)
High volt stimulation to thoracolumbar multifidi (225V on L side and 190 V on R side) applied hotpack as well.

## 2016-11-08 NOTE — Therapy (Signed)
[place] [facility] [address] [place], [place], [address] Phone: [phone]   Fax:  [phone]  Physical Therapy Treatment  Patient Details  Name: Gail Paul MRN: [id number] Date of Birth: 01/08/1934 Referring Provider: Dr. Gayla MedicusKhan  Encounter Date: 11/08/2016      PT End of Session - 11/08/16 1506    Visit Number 10   Number of Visits 17   Date for PT Re-Evaluation 12/04/16   PT Start Time 1450   PT Stop Time 1528   PT Time Calculation (min) 38 min   Activity Tolerance Patient tolerated treatment well;Patient limited by pain   Behavior [facility] for tasks assessed/performed      Past Medical History:  Diagnosis Date  . CHF (congestive heart failure) (HCC)   . Heart attack (HCC)   . Hypertension     Past Surgical History:  Procedure Laterality Date  . BACK SURGERY    . carpel tunnel surgery    . CATARACT EXTRACTION      There were no vitals filed for this visit.      Subjective Assessment - 11/08/16 1503    Subjective Patient reports her R shoulder has been very sore since prior PT session from rolling on the mat table. She reports her upper back through lower back feel very tight and she gets cramps and can't walk at times. She reports her L hip now has just occasional short duration pain, she feels stronger.    Pertinent History History of falls, bilateral shoulder fx, L4-L5 fusion, 2 MIs. Multiple blockages on R side of heart. History of kidney injury, L shoulder sx, tendonitis of L forearm, history of R shoulder pain.     Limitations Sitting;Lifting;Walking;House hold activities   Diagnostic tests X-ray lumbar spine [id number], 2018: extensive postsurgical changes are noted at L3-4 and L4-5 with intervertebral disc spacer device at L4-5 and rods and pedicle screws at L3-4 and L5.  Mild SI joint arthropathy.  Prior laminectomy at L4.  Marked disc space narrowing T12-L1 and L1-2 and L2-3.  No  compression fxs lytic or blastic lesions.  Minimal aortic calcification.     Currently in Pain? Yes   Pain Onset More than a month ago      High volt stimulation to thoracolumbar multifidi (225V on L side and 190 V on R side) applied hotpack as well.  X 12.5 minutes  Soft tissue mobilization provided to thoracolumbar area bilaterally with several areas of tenderness reported. Patient continues to have areas where she complains of severe pain initially, then no pain with treatment of areas in her thoraco/lumbar musculature.                            PT Education - 11/08/16 1514    Education provided Yes   Education Details That it is likely an unrealistic goal for patient to be completely pain free. Instead it is likely more appropriate to make a goal for her to be able to complete her ADLs.    Person(s) Educated Patient   Methods Explanation   Comprehension Verbalized understanding             PT Long Term Goals - 10/09/16 1521      PT LONG TERM GOAL #1   Title Patient will report mODI of less than 60% to demonstrate improved tolerance for ADLs.    Time 5   Period Weeks  Status New   Target Date 11/13/16     PT LONG TERM GOAL #2   Title Patient will report worst pain of less than 6/10 to demonstrate improved tolerance for ADLs.    Baseline 10+   Time 5   Period Weeks   Status New   Target Date 11/13/16     PT LONG TERM GOAL #3   Title Patient will perform 5x sit to stand in less than 20" to demonstrate improved LE strength for decreased falls risk.    Baseline Did not complete    Time 5   Period Weeks   Status New   Target Date 11/13/16               Plan - 11/08/16 1539    Clinical Impression Statement Patient continues to report she is unable to tolerate certain aspects of therapy. She reports her R shoulder was sore after prior session from rolling in bed to get to a prone position, in previous sessions she was experiencing DOMS  after isometric exercises. Patient has given very conflicting messages regarding her tolerance for exercises as she first states she likes the exercises she's doing at home and in clinic, but that she also gets so sore she can barely walk. The goal is to eventually transition her from modalities to ther-ex as tolerated.    Clinical Presentation Evolving   Clinical Decision Making Moderate   Rehab Potential Fair   PT Frequency 2x / week   PT Duration 4 weeks   PT Treatment/Interventions Aquatic Therapy;Electrical Stimulation;Cryotherapy;Passive range of motion;Taping;Manual techniques;Dry needling;Neuromuscular re-education;Gait Network engineer;Iontophoresis /ml Dexamethasone;Traction;Ultrasound;Therapeutic exercise;Therapeutic activities;Balance training   PT Next Visit Plan Progress isometrics to eccentric HS work as indicated. Glute strengthening as able.    PT Home Exercise Plan Isometric HS and seated HS stretching.    Consulted and Agree with Plan of Care Patient      Patient will benefit from skilled therapeutic intervention in order to improve the following deficits and impairments:  Abnormal gait, Pain, Improper body mechanics, Decreased balance, Difficulty walking, Decreased strength, Decreased range of motion, Decreased activity tolerance, Decreased endurance, Cardiopulmonary status limiting activity  Visit Diagnosis: Chronic midline low back pain without sciatica  Left hamstring injury, initial encounter       G-Codes - 2016-11-20 1046    Functional Assessment Tool Used (Outpatient Only) mODI, observation    Functional Limitation Mobility: Walking and moving around   Mobility: Walking and Moving Around Current Status [phone]) At least 20 percent but less than 40 percent impaired, limited or restricted   Mobility: Walking and Moving Around Goal Status [phone]) At least 20 percent but less than 40 percent impaired, limited or restricted      Problem List There are no  active problems to display for this patient.  Alva Garnet PT, DPT, CSCS    11/08/2016, 3:45 PM  [place] [facility] PHYSICAL AND SPORTS MEDICINE [address] [place], [address] Phone: [phone]   Fax:  [phone]  Name: GENEAL HUEBERT MRN: [id number] Date of Birth: 1933-09-05

## 2016-11-14 ENCOUNTER — Encounter: Payer: Medicare Other | Admitting: Physical Therapy

## 2016-11-21 ENCOUNTER — Ambulatory Visit: Payer: Medicare Other | Admitting: Physical Therapy

## 2016-11-21 DIAGNOSIS — M545 Low back pain: Principal | ICD-10-CM

## 2016-11-21 DIAGNOSIS — S76302A Unspecified injury of muscle, fascia and tendon of the posterior muscle group at thigh level, left thigh, initial encounter: Secondary | ICD-10-CM

## 2016-11-21 DIAGNOSIS — G8929 Other chronic pain: Secondary | ICD-10-CM

## 2016-11-22 NOTE — Therapy (Signed)
[place] [facility]ORTS MEDICINE [address], [place], [address] Phone: [phone]   Fax:  [phone]  Physical Therapy Treatment  Patient Details  Name: Gail Paul MRN: [id number] Date of Birth: June 18, 1933 Referring Provider: Dr. Gayla Medicus Date: 11/21/2016      PT End of Session - 11/21/16 1534    Visit Number 11   Number of Visits 17   Date for PT Re-Evaluation 12/04/16   PT Start Time 1531   PT Stop Time 1600   PT Time Calculation (min) 29 min   Activity Tolerance Patient tolerated treatment well   Behavior During Therapy [facility] for tasks assessed/performed      Past Medical History:  Diagnosis Date  . CHF (congestive heart failure) (HCC)   . Heart attack (HCC)   . Hypertension     Past Surgical History:  Procedure Laterality Date  . BACK SURGERY    . carpel tunnel surgery    . CATARACT EXTRACTION      There were no vitals filed for this visit.      Subjective Assessment - 11/21/16 1533    Subjective Patient reports her hip and back are 90-95% better. She is performing her HEP as indicated and is finding significant benefit from the exercises. She would like to begin to wean from therapy sessions.    Pertinent History History of falls, bilateral shoulder fx, L4-L5 fusion, 2 MIs. Multiple blockages on R side of heart. History of kidney injury, L shoulder sx, tendonitis of L forearm, history of R shoulder pain.     Limitations Sitting;Lifting;Walking;House hold activities   Diagnostic tests X-ray lumbar spine [id number], 2018: extensive postsurgical changes are noted at L3-4 and L4-5 with intervertebral disc spacer device at L4-5 and rods and pedicle screws at L3-4 and L5.  Mild SI joint arthropathy.  Prior laminectomy at L4.  Marked disc space narrowing T12-L1 and L1-2 and L2-3.  No compression fxs lytic or blastic lesions.  Minimal aortic calcification.     Currently in Pain? Yes  Mild ache in lumbar spine  flank, more discomfort in anterior R shoulder)   Pain Descriptors / Indicators Aching;Sore   Pain Type Chronic pain   Pain Onset More than a month ago   Pain Frequency Intermittent       Sidelying clamshells with yellow t-band x 12 repetitions x 2 sets (appropriate intensity)    Standing hip abductions x 10 with HHA (appropriate intensity)   Supine bridging x 8 repetitions x 2 sets (cuing to go through comfortable ROM)   Provided the above for beginning HEP                       PT Education - 11/22/16 1012    Education provided Yes   Education Details Will begin to go to 1x per week for rehab sessions, provided updated HEP this date to increase tolerance to ADLs.    Person(s) Educated Patient   Methods Explanation;Demonstration;Handout   Comprehension Verbalized understanding;Returned demonstration             PT Long Term Goals - 10/09/16 1521      PT LONG TERM GOAL #1   Title Patient will report mODI of less than 60% to demonstrate improved tolerance for ADLs.    Time 5   Period Weeks   Status New   Target Date 11/13/16     PT LONG TERM GOAL #2  Title Patient will report worst pain of less than 6/10 to demonstrate improved tolerance for ADLs.    Baseline 10+   Time 5   Period Weeks   Status New   Target Date 11/13/16     PT LONG TERM GOAL #3   Title Patient will perform 5x sit to stand in less than 20" to demonstrate improved LE strength for decreased falls risk.    Baseline Did not complete    Time 5   Period Weeks   Status New   Target Date 11/13/16               Plan - 11/21/16 1537    Clinical Impression Statement Patient reports she has made excellent progress with pain management, and appears appropriate to begin weaning from in clinic PT sessions. She reports she has felt significant improvement in her strength, and was provided with HEP today to continue to progress LE strength to reduce loading of her lumbar spine.     Clinical Presentation Stable   Clinical Decision Making Moderate   Rehab Potential Fair   PT Frequency 2x / week   PT Duration 4 weeks   PT Treatment/Interventions Aquatic Therapy;Electrical Stimulation;Cryotherapy;Passive range of motion;Taping;Manual techniques;Dry needling;Neuromuscular re-education;Gait Network engineer;Iontophoresis /ml Dexamethasone;Traction;Ultrasound;Therapeutic exercise;Therapeutic activities;Balance training   PT Next Visit Plan Progress isometrics to eccentric HS work as indicated. Glute strengthening as able.    PT Home Exercise Plan Isometric HS and seated HS stretching.    Consulted and Agree with Plan of Care Patient      Patient will benefit from skilled therapeutic intervention in order to improve the following deficits and impairments:  Abnormal gait, Pain, Improper body mechanics, Decreased balance, Difficulty walking, Decreased strength, Decreased range of motion, Decreased activity tolerance, Decreased endurance, Cardiopulmonary status limiting activity  Visit Diagnosis: Chronic midline low back pain without sciatica  Left hamstring injury, initial encounter     Problem List There are no active problems to display for this patient.  Alva Garnet PT, DPT, CSCS    11/22/2016, 10:15 AM  [place] [facility] PHYSICAL AND SPORTS MEDICINE [address], [place], [address] Phone: [phone]   Fax:  [phone]  Name: Gail Paul MRN: [id number] Date of Birth: 01/08/1934

## 2016-11-23 ENCOUNTER — Ambulatory Visit: Payer: Medicare Other | Admitting: Physical Therapy

## 2016-11-27 ENCOUNTER — Ambulatory Visit: Payer: Medicare Other | Admitting: Physical Therapy

## 2016-11-27 DIAGNOSIS — S76302A Unspecified injury of muscle, fascia and tendon of the posterior muscle group at thigh level, left thigh, initial encounter: Secondary | ICD-10-CM

## 2016-11-27 DIAGNOSIS — G8929 Other chronic pain: Secondary | ICD-10-CM

## 2016-11-27 DIAGNOSIS — M545 Low back pain: Secondary | ICD-10-CM | POA: Diagnosis not present

## 2016-11-27 NOTE — Patient Instructions (Signed)
Lower trunk rotations for 15" holds   Double knees to chest (R shoulder had pain)   Piriformis stretching

## 2016-11-28 NOTE — Therapy (Signed)
[place] [facility] PHYS[facility] SPORTS MEDICINE [address], [place], [address] Phone: [phone]   Fax:  [phone]  Physical Therapy Treatment  Patient Details  Name: Gail Paul MRN: [id number] Date of Birth: 1933-12-20 Referring Provider: Dr. Gayla Medicus Date: 11/27/2016      PT End of Session - 11/27/16 1700    Visit Number 12   Number of Visits 17   Date for PT Re-Evaluation 12/04/16   PT Start Time 1656   PT Stop Time 1723   PT Time Calculation (min) 27 min   Activity Tolerance Patient tolerated treatment well   Behavior During Therapy [facility] for tasks assessed/performed      Past Medical History:  Diagnosis Date  . CHF (congestive heart failure) (HCC)   . Heart attack (HCC)   . Hypertension     Past Surgical History:  Procedure Laterality Date  . BACK SURGERY    . carpel tunnel surgery    . CATARACT EXTRACTION      There were no vitals filed for this visit.      Subjective Assessment - 11/27/16 1657    Subjective Patient reports her that her back is no longer hurting, more of a stiffness feeling on the flanks bilaterally since she started her walking program (5 minutes on TM at slow speed). She reports her leg is feeling better as well.    Pertinent History History of falls, bilateral shoulder fx, L4-L5 fusion, 2 MIs. Multiple blockages on R side of heart. History of kidney injury, L shoulder sx, tendonitis of L forearm, history of R shoulder pain.     Limitations Sitting;Lifting;Walking;House hold activities   Diagnostic tests X-ray lumbar spine [id number], 2018: extensive postsurgical changes are noted at L3-4 and L4-5 with intervertebral disc spacer device at L4-5 and rods and pedicle screws at L3-4 and L5.  Mild SI joint arthropathy.  Prior laminectomy at L4.  Marked disc space narrowing T12-L1 and L1-2 and L2-3.  No compression fxs lytic or blastic lesions.  Minimal aortic calcification.     Currently in Pain? Other  (Comment)      Lower trunk rotations for 15" holds x 5 bouts with cuing for proper technique and use of a towel around the knees so as not to stress her R shoulder   Double knees to chest (R shoulder had pain) for 15-30" holds x 5 bouts with cuing for technique and educated on use of towel around knees to ease stress on R shoulder.   Piriformis stretching x 5 bouts x 15-30" per bout with towel used to reduce tension on R shoulder, patient reported near total reduction in stiffness while ambulating after completing stretching. Educated patient to complete stretching before and after walking program to ease stiffness.                             PT Education - 11/27/16 1707    Education provided Yes   Education Details Perform stretching program provided today before and after walking sessions.    Person(s) Educated Patient   Methods Explanation;Demonstration;Handout   Comprehension Verbalized understanding;Returned demonstration             PT Long Term Goals - 10/09/16 1521      PT LONG TERM GOAL #1   Title Patient will report mODI of less than 60% to demonstrate improved tolerance for ADLs.    Time 5  Period Weeks   Status New   Target Date 11/13/16     PT LONG TERM GOAL #2   Title Patient will report worst pain of less than 6/10 to demonstrate improved tolerance for ADLs.    Baseline 10+   Time 5   Period Weeks   Status New   Target Date 11/13/16     PT LONG TERM GOAL #3   Title Patient will perform 5x sit to stand in less than 20" to demonstrate improved LE strength for decreased falls risk.    Baseline Did not complete    Time 5   Period Weeks   Status New   Target Date 11/13/16               Plan - 11/27/16 1704    Clinical Impression Statement Patient has made excellent progress towards her goal of being more physically active with less low back and leg pain. She denies pain currently, more stiffness is what she is reporting.  She was provided with stretching program which helped to ease this in this session, and will follow up for likely discharge in 2 weeks.    Clinical Presentation Stable   Clinical Decision Making Moderate   Rehab Potential Fair   PT Frequency 2x / week   PT Duration 4 weeks   PT Treatment/Interventions Aquatic Therapy;Electrical Stimulation;Cryotherapy;Passive range of motion;Taping;Manual techniques;Dry needling;Neuromuscular re-education;Gait Network engineer;Iontophoresis /ml Dexamethasone;Traction;Ultrasound;Therapeutic exercise;Therapeutic activities;Balance training   PT Next Visit Plan Progress isometrics to eccentric HS work as indicated. Glute strengthening as able.    PT Home Exercise Plan Isometric HS and seated HS stretching.    Consulted and Agree with Plan of Care Patient      Patient will benefit from skilled therapeutic intervention in order to improve the following deficits and impairments:  Abnormal gait, Pain, Improper body mechanics, Decreased balance, Difficulty walking, Decreased strength, Decreased range of motion, Decreased activity tolerance, Decreased endurance, Cardiopulmonary status limiting activity  Visit Diagnosis: Chronic midline low back pain without sciatica  Left hamstring injury, initial encounter     Problem List There are no active problems to display for this patient.  Alva Garnet PT, DPT, CSCS    11/28/2016, 9:11 AM  [place] [facility] PHYSICAL AND SPORTS MEDICINE [address], [place], [address] Phone: [phone]   Fax:  [phone]  Name: Gail Paul MRN: [id number] Date of Birth: November 06, 1933

## 2016-11-29 ENCOUNTER — Ambulatory Visit: Payer: Medicare Other | Admitting: Physical Therapy

## 2016-12-04 ENCOUNTER — Ambulatory Visit: Payer: Medicare Other | Admitting: Physical Therapy

## 2016-12-06 ENCOUNTER — Encounter: Payer: Medicare Other | Admitting: Physical Therapy

## 2016-12-11 ENCOUNTER — Encounter: Payer: Medicare Other | Admitting: Physical Therapy

## 2016-12-18 ENCOUNTER — Ambulatory Visit: Payer: Medicare Other | Attending: Internal Medicine | Admitting: Physical Therapy

## 2016-12-18 DIAGNOSIS — M5136 Other intervertebral disc degeneration, lumbar region: Secondary | ICD-10-CM | POA: Diagnosis not present

## 2016-12-18 DIAGNOSIS — G8929 Other chronic pain: Secondary | ICD-10-CM

## 2016-12-18 DIAGNOSIS — M545 Low back pain, unspecified: Secondary | ICD-10-CM

## 2016-12-18 DIAGNOSIS — S76302A Unspecified injury of muscle, fascia and tendon of the posterior muscle group at thigh level, left thigh, initial encounter: Secondary | ICD-10-CM

## 2016-12-18 NOTE — Therapy (Signed)
[facility] PHYSICAL AND SPORTS MEDICINE [address] [place], [address] Phone: [phone]   Fax:  [phone]  Physical Therapy Treatment  Patient Details  Name: Gail Paul MRN: [id number] Date of Birth: [birth year] Referring Provider: Dr. Gayla Medicus Date: 12/18/2016      PT End of Session - 12/18/16 1507    Visit Number 13   Number of Visits 17   Date for PT Re-Evaluation 01/29/17   PT Start Time 1507   PT Stop Time 1533   PT Time Calculation (min) 26 min   Activity Tolerance Patient tolerated treatment well   Behavior During Therapy [facility] for tasks assessed/performed      Past Medical History:  Diagnosis Date  . CHF (congestive heart failure) (HCC)   . Heart attack (HCC)   . Hypertension     Past Surgical History:  Procedure Laterality Date  . BACK SURGERY    . carpel tunnel surgery    . CATARACT EXTRACTION      There were no vitals filed for this visit.      Subjective Assessment - 12/18/16 1507    Subjective Patient reports her back pain has been coming and going. Her L hip has not been bothering her as frequently. She would like to work on stretching her upper back more.    Pertinent History History of falls, bilateral shoulder fx, L4-L5 fusion, 2 MIs. Multiple blockages on R side of heart. History of kidney injury, L shoulder sx, tendonitis of L forearm, history of R shoulder pain.     Limitations Sitting;Lifting;Walking;House hold activities   Diagnostic tests X-ray lumbar spine [id number], 2018: extensive postsurgical changes are noted at L3-4 and L4-5 with intervertebral disc spacer device at L4-5 and rods and pedicle screws at L3-4 and L5.  Mild SI joint arthropathy.  Prior laminectomy at L4.  Marked disc space narrowing T12-L1 and L1-2 and L2-3.  No compression fxs lytic or blastic lesions.  Minimal aortic calcification.     Currently in Pain? Other (Comment)      T4-T7 joint mobilizations grade I x 5  bouts per segment x 30-45" to tolerance with positive response noted by patient   T1-C6 joint mobilizations grade I x 3 bouts though quite tender, opted to perform soft tissue mobilization over this area instead   Soft tissue mobilization over C6-T2, noted to initially be tender, though this decreased with prolonged exposure to STM, reported relieving sensation after prolonged bout    Seated thoracic extension for HEP provided education and observed how to complete which was appropriate with cuing (felt a good stretching sensation)                            PT Education - 12/18/16 1507    Education provided Yes   Education Details Return to therapist in 1-2 weeks, provided thoracic extensions in sitting for HEP.    Person(s) Educated Patient   Methods Explanation;Demonstration   Comprehension Verbalized understanding;Returned demonstration             PT Long Term Goals - 12/18/16 1508      PT LONG TERM GOAL #1   Title Patient will report mODI of less than 60% to demonstrate improved tolerance for ADLs.    Time 5   Period Weeks   Status On-going     PT LONG TERM GOAL #2   Title Patient will report worst pain  of less than 6/10 to demonstrate improved tolerance for ADLs.    Baseline 10+ at baseline. On Jan 10, 2023 -    Time 5   Period Weeks   Status On-going     PT LONG TERM GOAL #3   Title Patient will perform 5x sit to stand in less than 20" to demonstrate improved LE strength for decreased falls risk.    Baseline Did not complete    Time 5   Period Weeks   Status On-going               Plan - 01-09-2017 1508    Clinical Impression Statement Patient reports she has had a sharp increasein pain, this time more in the upper back. She reports she discussed with her MD, and she would like to continue with therapy to address this. She had some very sensitive areas in the upper thoracic spine, likely from poor posture in gait, and sitting. She would  likely benefit from thoracic extension stretching and manual techniques/exercise to address her posture.    Clinical Presentation Stable   Clinical Decision Making Moderate   Rehab Potential Fair   PT Frequency 2x / week   PT Duration 4 weeks   PT Treatment/Interventions Aquatic Therapy;Electrical Stimulation;Cryotherapy;Passive range of motion;Taping;Manual techniques;Dry needling;Neuromuscular re-education;Gait Network engineer;Iontophoresis /ml Dexamethasone;Traction;Ultrasound;Therapeutic exercise;Therapeutic activities;Balance training   PT Next Visit Plan Progress isometrics to eccentric HS work as indicated. Glute strengthening as able.    PT Home Exercise Plan Isometric HS and seated HS stretching.    Consulted and Agree with Plan of Care Patient      Patient will benefit from skilled therapeutic intervention in order to improve the following deficits and impairments:  Abnormal gait, Pain, Improper body mechanics, Decreased balance, Difficulty walking, Decreased strength, Decreased range of motion, Decreased activity tolerance, Decreased endurance, Cardiopulmonary status limiting activity  Visit Diagnosis: Chronic midline low back pain without sciatica  Left hamstring injury, initial encounter       G-Codes - 09-Jan-2017 1510    Functional Assessment Tool Used (Outpatient Only) mODI, observation    Functional Limitation Mobility: Walking and moving around   Mobility: Walking and Moving Around Current Status ([id number]) At least 20 percent but less than 40 percent impaired, limited or restricted   Mobility: Walking and Moving Around Goal Status [phone]) At least 1 percent but less than 20 percent impaired, limited or restricted      Problem List There are no active problems to display for this patient.  Alva Garnet PT, DPT, CSCS    Jan 09, 2017, 3:39 PM  [facility] PHYSICAL AND SPORTS MEDICINE [address] [place],  [address] Phone: [phone]   Fax:  [phone]  Name: Gail Paul MRN: [id number] Date of Birth: 03/03/1934

## 2016-12-18 NOTE — Patient Instructions (Signed)
T4-T7  T1-C6 joint mobilizations grade I-II   Soft tissue mobilization over C6-T2   Seated thoracic extension for HEP provided education and observed how to complete

## 2016-12-20 ENCOUNTER — Encounter: Payer: Medicare Other | Admitting: Physical Therapy

## 2016-12-25 ENCOUNTER — Encounter: Payer: Medicare Other | Admitting: Physical Therapy

## 2016-12-27 ENCOUNTER — Encounter: Payer: Medicare Other | Admitting: Physical Therapy

## 2017-01-01 ENCOUNTER — Encounter: Payer: Medicare Other | Admitting: Physical Therapy

## 2017-01-08 ENCOUNTER — Ambulatory Visit: Payer: Medicare Other | Admitting: Physical Therapy

## 2017-01-15 ENCOUNTER — Ambulatory Visit: Payer: Medicare Other | Attending: Internal Medicine | Admitting: Physical Therapy

## 2017-01-23 ENCOUNTER — Encounter: Payer: Medicare Other | Admitting: Physical Therapy

## 2017-01-28 ENCOUNTER — Ambulatory Visit: Payer: Medicare Other | Admitting: Physical Therapy

## 2017-02-04 ENCOUNTER — Other Ambulatory Visit: Payer: Medicare Other

## 2017-02-05 ENCOUNTER — Encounter: Payer: Medicare Other | Admitting: Physical Therapy
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
## 2017-07-02 ENCOUNTER — Other Ambulatory Visit: Payer: Self-pay | Admitting: Internal Medicine

## 2017-07-02 DIAGNOSIS — N6091 Unspecified benign mammary dysplasia of right breast: Secondary | ICD-10-CM

## 2017-07-26 ENCOUNTER — Ambulatory Visit
Admission: RE | Admit: 2017-07-26 | Discharge: 2017-07-26 | Disposition: A | Discharge status: Home / Self Care | Payer: Medicare HMO | Source: Ambulatory Visit | Attending: Internal Medicine | Admitting: Internal Medicine

## 2017-07-26 DIAGNOSIS — N6091 Unspecified benign mammary dysplasia of right breast: Secondary | ICD-10-CM

## 2018-03-12 ENCOUNTER — Encounter: Payer: Self-pay | Admitting: Emergency Medicine

## 2018-03-12 ENCOUNTER — Emergency Department
Admission: EM | Admit: 2018-03-12 | Discharge: 2018-03-12 | Disposition: A | Discharge status: Home / Self Care | Payer: Medicare HMO | Attending: Emergency Medicine | Admitting: Emergency Medicine

## 2018-03-12 DIAGNOSIS — Z7982 Long term (current) use of aspirin: Secondary | ICD-10-CM | POA: Diagnosis not present

## 2018-03-12 DIAGNOSIS — I110 Hypertensive heart disease with heart failure: Secondary | ICD-10-CM | POA: Insufficient documentation

## 2018-03-12 DIAGNOSIS — I509 Heart failure, unspecified: Secondary | ICD-10-CM | POA: Insufficient documentation

## 2018-03-12 DIAGNOSIS — R899 Unspecified abnormal finding in specimens from other organs, systems and tissues: Secondary | ICD-10-CM

## 2018-03-12 DIAGNOSIS — Z7902 Long term (current) use of antithrombotics/antiplatelets: Secondary | ICD-10-CM | POA: Insufficient documentation

## 2018-03-12 DIAGNOSIS — R9431 Abnormal electrocardiogram [ECG] [EKG]: Secondary | ICD-10-CM | POA: Diagnosis present

## 2018-03-12 DIAGNOSIS — R7989 Other specified abnormal findings of blood chemistry: Secondary | ICD-10-CM | POA: Insufficient documentation

## 2018-03-12 DIAGNOSIS — Z79899 Other long term (current) drug therapy: Secondary | ICD-10-CM | POA: Diagnosis not present

## 2018-03-12 LAB — CBC
HCT: 40 % (ref 36.0–46.0)
HEMOGLOBIN: 13.1 g/dL (ref 12.0–15.0)
MCH: 30.8 pg (ref 26.0–34.0)
MCHC: 32.8 g/dL (ref 30.0–36.0)
MCV: 94.1 fL (ref 80.0–100.0)
NRBC: 0 % (ref 0.0–0.2)
PLATELETS: 227 10*3/uL (ref 150–400)
RBC: 4.25 MIL/uL (ref 3.87–5.11)
RDW: 12.3 % (ref 11.5–15.5)
WBC: 6.1 10*3/uL (ref 4.0–10.5)

## 2018-03-12 LAB — COMPREHENSIVE METABOLIC PANEL
ALBUMIN: 4.2 g/dL (ref 3.5–5.0)
ALK PHOS: 46 U/L (ref 38–126)
ALT: 19 U/L (ref 0–44)
AST: 21 U/L (ref 15–41)
Anion gap: 7 (ref 5–15)
BUN: 35 mg/dL — ABNORMAL HIGH (ref 8–23)
CALCIUM: 9.2 mg/dL (ref 8.9–10.3)
CHLORIDE: 112 mmol/L — AB (ref 98–111)
CO2: 22 mmol/L (ref 22–32)
CREATININE: 1.24 mg/dL — AB (ref 0.44–1.00)
GFR calc Af Amer: 47 mL/min — ABNORMAL LOW (ref >60)
GFR calc non Af Amer: 40 mL/min — ABNORMAL LOW (ref >60)
GLUCOSE: 118 mg/dL — AB (ref 70–99)
Potassium: 5.1 mmol/L (ref 3.5–5.1)
SODIUM: 141 mmol/L (ref 135–145)
Total Bilirubin: 0.9 mg/dL (ref 0.3–1.2)
Total Protein: 7.1 g/dL (ref 6.5–8.1)

## 2018-03-12 NOTE — ED Notes (Signed)
Pt stated that she was told that her potassium was elevated and that the doctors office sent a RX over for some medication to help treat it. Pt stated that the whole town of was out of this medication and was not able to get it filled so the doctors office told her to come to the ED. Pt denies any of any complaints at this time.

## 2018-03-12 NOTE — ED Provider Notes (Addendum)
[facility] Emergency Department Provider Note  ____________________________________________   First MD Initiated Contact with Patient 03/12/18 2252     (approximate)  I have reviewed the triage vital signs and the nursing notes.   HISTORY  Chief Complaint Abnormal ECG    HPI Gail Paul is a 85 y.o. female with medical history as listed below who presents for evaluation after reportedly having an abnormal lab result during routine outpatient lab testing.  According to the patient, she had routine blood work drawn anywhere from 3 to 6 weeks ago.  She states that there was some kind of mixup or error that resulted in her not being informed that the potassium was 6.2 or 6.3.  She subsequently followed up with her doctor just recently and reportedly her primary care doctor has prescribed her a medication to reduce her potassium, but it is not available at any of the local pharmacies.  This process is been going on apparently for a few days.  When at least the second if not third local pharmacy did not have it available, she called the doctor and ask what she should do, and Dr. Welton Flakes told her she should go to the emergency department to make sure that her potassium is not elevated and that she does not require treatment.  The patient reports that she feels fine.  She denies headache, lightheadedness, chest pain, shortness of breath, nausea, vomiting, abdominal pain, and constipation.  She has no symptoms and thus cannot quantify the severity nor any modifying factors.  She said the only thing that bothers her recently is that she will have a single hiccup when she goes to bed at night.  Past Medical History:  Diagnosis Date  . CHF (congestive heart failure) (HCC)   . Heart attack (HCC)   . Hypertension     There are no active problems to display for this patient.   Past Surgical History:  Procedure Laterality Date  . BACK SURGERY    . carpel tunnel surgery     . CATARACT EXTRACTION      Prior to Admission medications   Medication Sig Start Date End Date Taking? Authorizing Provider  Ascorbic Acid (VITAMIN C) 1000 MG tablet Take 1,000 mg by mouth daily.    [provider]  aspirin 81 MG tablet Take 81 mg by mouth daily.    [provider]  atorvastatin (LIPITOR) 80 MG tablet Take 80 mg by mouth daily.    [provider]  B Complex-C (B-COMPLEX WITH VITAMIN C) tablet Take 1 tablet by mouth daily.    [provider]  cholecalciferol (VITAMIN D) 1000 UNITS tablet Take 1,000 Units by mouth daily.    [provider]  citalopram (CELEXA) 20 MG tablet Take 20 mg by mouth daily.    [provider]  clonazePAM (KLONOPIN) 0.5 MG tablet Take 0.5 mg by mouth daily.     [provider]  clopidogrel (PLAVIX) 75 MG tablet Take 75 mg by mouth daily.    [provider]  co-enzyme Q-10 50 MG capsule Take 100 mg by mouth daily.    [provider]  isosorbide mononitrate (IMDUR) 30 MG 24 hr tablet Take 30 mg by mouth daily.    [provider]  lisinopril (PRINIVIL,ZESTRIL) 10 MG tablet Take 10 mg by mouth daily.    [provider]  meloxicam (MOBIC) 7.5 MG tablet Take 7.5 mg by mouth daily.    [provider]  metFORMIN (GLUCOPHAGE) 500 MG tablet Take 500 mg by mouth daily with breakfast.    [provider]  metoprolol tartrate (LOPRESSOR) 25 MG tablet Take 25 mg by mouth daily.     [provider]  Omega-3 Fatty Acids (FISH OIL) 1000 MG CAPS Take 1,000 mg by mouth daily.    [provider]    Allergies Shellfish allergy  No family history on file.  Social History Social History   Tobacco Use  . Smoking status: Never Smoker  . Smokeless tobacco: Never Used  Substance Use Topics  . Alcohol use: No  . Drug use: No    Review of Systems Constitutional: No fever/chills Eyes: No visual changes. ENT: No sore  throat. Cardiovascular: Denies chest pain. Respiratory: Denies shortness of breath. Gastrointestinal: No abdominal pain.  No nausea, no vomiting.  No diarrhea.  No constipation. Genitourinary: Negative for dysuria. Musculoskeletal: Negative for neck pain.  Negative for back pain. Integumentary: Negative for rash. Neurological: Negative for headaches, focal weakness or numbness.   ____________________________________________   PHYSICAL EXAM:  VITAL SIGNS: ED Triage Vitals  Enc Vitals Group     BP 03/12/18 1815 112/62     Pulse Rate 03/12/18 1815 73     Resp 03/12/18 1815 18     Temp 03/12/18 1815 98.3 F (36.8 C)     Temp Source 03/12/18 1815 Oral     SpO2 03/12/18 1815 96 %     Weight 03/12/18 1816 77.1 kg (170 lb)     Height 03/12/18 1816 1.6 m (5\' 3" )     Head Circumference --      Peak Flow --      Pain Score 03/12/18 1815 6     Pain Loc --      Pain Edu? --      Excl. in GC? --     Constitutional: Alert and oriented. Well appearing and in no acute distress. Eyes: Conjunctivae are normal.  Head: Atraumatic. Nose: No congestion/rhinnorhea. Mouth/Throat: Mucous membranes are moist. Neck: No stridor.  No meningeal signs.   Cardiovascular: Normal rate, regular rhythm. Good peripheral circulation. Grossly normal heart sounds. Respiratory: Normal respiratory effort.  No retractions. Lungs CTAB. Gastrointestinal: Soft and nontender. No distention.  Musculoskeletal: No lower extremity tenderness nor edema. No gross deformities of extremities. Neurologic:  Normal speech and language. No gross focal neurologic deficits are appreciated.  Skin:  Skin is warm, dry and intact. No rash noted. Psychiatric: Mood and affect are normal. Speech and behavior are normal.  ____________________________________________   LABS (all labs ordered are listed, but only abnormal results are displayed)  Labs Reviewed  COMPREHENSIVE METABOLIC PANEL - Abnormal; Notable for the following  components:      Result Value   Chloride 112 (*)    Glucose, Bld 118 (*)    BUN 35 (*)    Creatinine, Ser 1.24 (*)    GFR calc non Af Amer 40 (*)    GFR calc Af Amer 47 (*)    All other components within normal limits  CBC   ____________________________________________  EKG  ED ECG REPORT I, Loleta Rose, the attending physician, personally viewed and interpreted this ECG.  Date: 03/12/2018 EKG Time: 18: 13 Rate: 77 Rhythm: normal sinus rhythm QRS Axis: normal Intervals: normal ST/T Wave abnormalities: Inverted T waves in lead III, otherwise unremarkable.  No peaked T waves. Narrative Interpretation: Non-specific ST segment / T-wave changes, but no clear evidence of acute ischemia.  ____________________________________________  RADIOLOGY  ED MD interpretation: No indication for imaging  Official radiology report(s): No results found.  ____________________________________________   PROCEDURES  Critical Care performed: No   Procedure(s) performed:   Procedures   ____________________________________________   INITIAL IMPRESSION / ASSESSMENT AND PLAN / ED COURSE  As part of my medical decision making, I reviewed the following data within the electronic MEDICAL RECORD NUMBER Nursing notes reviewed and incorporated, Labs reviewed , Old chart reviewed and Notes from prior ED visits    Differential diagnosis includes, but is not limited to, hemolysis of outpatient blood draw, delay and response to abnormal labs, medication side effect, renal dysfunction.  The patient has absolutely no symptoms I would not have come into the emergency department except for concern for her potassium being elevated as an outpatient and not being able to get the medication prescribed by her primary care doctor to keep her potassium level down.  However it is within normal limits today at 5.1.  Her creatinine is very slightly elevated at 1.24 with a slightly elevated BUN but she has no other  symptoms and is tolerating oral intake without any difficulty.  Her last creatinine on record in our system is from 2017, so 1.2 could be her new baseline.  I encouraged her to drink plenty of water and follow-up with her doctor at the next fillable opportunity but explained that there is no indication for any further intervention at this time.  She states that she understands and agrees with the plan.  I gave my usual and customary return precautions.  Of note, I try to send a secure message through [facility] Dr. Yves DillNeelam Khan to update her about the emergency department visit, but she is not listed as a valid recipient for [facility] messages.     ____________________________________________  FINAL CLINICAL IMPRESSION(S) / ED DIAGNOSES  Final diagnoses:  Abnormal laboratory test result     MEDICATIONS GIVEN DURING THIS VISIT:  Medications - No data to display   ED Discharge Orders    None       Note:  This document was prepared using Dragon voice recognition software and may include unintentional dictation errors.    Loleta RoseForbach, Tyreke Kaeser, MD 03/12/18 2310    Loleta RoseForbach, Yee Gangi, MD 03/12/18 [id number]    Loleta RoseForbach, Klyn Kroening, MD 03/12/18 2315

## 2018-03-12 NOTE — ED Triage Notes (Signed)
Patient presents to the ED after her PCP told her that her potassium was 6.2 and they wanted her to come to the ED to get checked out. Patient states she feels fine.  Patient is frustrated because her doctor gave her a prescription for medication to reduce her potassium but the pharmacy has not been able to fill it.

## 2018-03-12 NOTE — Discharge Instructions (Signed)
As we discussed, your lab work was generally reassuring tonight with a potassium level that was within normal limits at 5.1.  I do not recommend treatment at this time.  It is possible you could benefit from some increased fluid intake so I recommend that you drink plenty of water (see the included recommendations on rehydration).  Please follow-up with your primary care doctor at the next available opportunity to discuss your lab results as well as reassessing whether or not you need the medication that was prescribed as an outpatient to keep your potassium low; based on your single emergency department visit tonight, it does not appear that it is necessary at this time.

## 2018-10-30 ENCOUNTER — Other Ambulatory Visit: Payer: Self-pay | Admitting: Internal Medicine

## 2018-10-30 DIAGNOSIS — Z1231 Encounter for screening mammogram for malignant neoplasm of breast: Secondary | ICD-10-CM

## 2018-12-04 ENCOUNTER — Ambulatory Visit
Admission: RE | Admit: 2018-12-04 | Discharge: 2018-12-04 | Disposition: A | Discharge status: Home / Self Care | Payer: Medicare HMO | Source: Ambulatory Visit | Attending: Internal Medicine | Admitting: Internal Medicine

## 2018-12-04 DIAGNOSIS — Z1231 Encounter for screening mammogram for malignant neoplasm of breast: Secondary | ICD-10-CM | POA: Diagnosis not present

## 2018-12-16 ENCOUNTER — Encounter (INDEPENDENT_AMBULATORY_CARE_PROVIDER_SITE_OTHER): Payer: Self-pay | Admitting: Vascular Surgery

## 2018-12-16 ENCOUNTER — Ambulatory Visit (INDEPENDENT_AMBULATORY_CARE_PROVIDER_SITE_OTHER): Payer: Medicare HMO | Admitting: Vascular Surgery

## 2018-12-16 ENCOUNTER — Other Ambulatory Visit: Payer: Self-pay

## 2018-12-16 DIAGNOSIS — E1169 Type 2 diabetes mellitus with other specified complication: Secondary | ICD-10-CM | POA: Diagnosis not present

## 2018-12-16 DIAGNOSIS — E119 Type 2 diabetes mellitus without complications: Secondary | ICD-10-CM | POA: Insufficient documentation

## 2018-12-16 DIAGNOSIS — I739 Peripheral vascular disease, unspecified: Secondary | ICD-10-CM

## 2018-12-16 DIAGNOSIS — I2510 Atherosclerotic heart disease of native coronary artery without angina pectoris: Secondary | ICD-10-CM | POA: Diagnosis not present

## 2018-12-16 DIAGNOSIS — S43429A Sprain of unspecified rotator cuff capsule, initial encounter: Secondary | ICD-10-CM | POA: Insufficient documentation

## 2018-12-16 DIAGNOSIS — I10 Essential (primary) hypertension: Secondary | ICD-10-CM | POA: Diagnosis not present

## 2018-12-16 DIAGNOSIS — M25619 Stiffness of unspecified shoulder, not elsewhere classified: Secondary | ICD-10-CM | POA: Insufficient documentation

## 2018-12-16 DIAGNOSIS — M654 Radial styloid tenosynovitis [de Quervain]: Secondary | ICD-10-CM | POA: Insufficient documentation

## 2018-12-16 DIAGNOSIS — M75120 Complete rotator cuff tear or rupture of unspecified shoulder, not specified as traumatic: Secondary | ICD-10-CM | POA: Insufficient documentation

## 2018-12-16 DIAGNOSIS — M25519 Pain in unspecified shoulder: Secondary | ICD-10-CM | POA: Insufficient documentation

## 2018-12-16 NOTE — Assessment & Plan Note (Signed)
Her left ABI was interpreted as noncompressible on her right ABI was 0.89. At this point, the patient has relatively mild peripheral arterial disease without any limb threatening symptoms.  We discussed the pathophysiology and the natural history of peripheral arterial disease.  She is on appropriate medical therapy with aspirin, Plavix, and a statin agent.  We discussed that she has no limb threatening symptoms and immediate intervention is not required at this time.  I will plan to see her back in about 3 months with noninvasive studies to follow her disease.  She will contact our office with any worsening symptoms in the interim.

## 2018-12-16 NOTE — Assessment & Plan Note (Signed)
Continue cardiac and antihypertensive medications as already ordered and reviewed, no changes at this time. Continue statin as ordered and reviewed, no changes at this time Nitrates PRN for chest pain  

## 2018-12-16 NOTE — Progress Notes (Signed)
Patient ID: Gail Paul, female   DOB: [birth year], 85 y.o.   MRN: [id number]  Chief Complaint  Patient presents with   New Patient (Initial Visit)    ref Welton FlakesKhan for PVD    HPI Gail Limana I Rushing is a 85 y.o. female.  I am asked to see the patient by Dr. Welton FlakesKhan for evaluation of PAD.  The patient has a significant history of coronary disease and has had multiple previous myocardial infarctions.  She says that she remains active and vigorous despite her advancing age.  She does not have a history of disabling claudication, ischemic rest pain, or ulceration.  She does have some tiredness in her legs with activity.  Her primary care physician perform noninvasive studies astutely assessing her for peripheral arterial disease given her significant cardiac history.  Her left ABI was interpreted as noncompressible on her right ABI was 0.89.  As such, she is referred for further evaluation and treatment.     Past Medical History:  Diagnosis Date   CHF (congestive heart failure) (HCC)    Heart attack (HCC)    Hypertension     Past Surgical History:  Procedure Laterality Date   BACK SURGERY     carpel tunnel surgery     CATARACT EXTRACTION      Family History No bleeding disorders, clotting disorders, autoimmune diseases, or aneurysms  Social History Social History   Tobacco Use   Smoking status: Never Smoker   Smokeless tobacco: Never Used  Substance Use Topics   Alcohol use: No   Drug use: No     Allergies  Allergen Reactions   Shellfish Allergy Swelling    Current Outpatient Medications  Medication Sig Dispense Refill   aspirin 81 MG tablet Take 81 mg by mouth daily.     atorvastatin (LIPITOR) 80 MG tablet Take 80 mg by mouth daily.     busPIRone (BUSPAR) 10 MG tablet buspirone 10 mg tablet     cetirizine (ZYRTEC) 10 MG tablet Take 10 mg by mouth daily.     cholecalciferol (VITAMIN D) 1000 UNITS tablet Take 1,000 Units by mouth daily.     clopidogrel  (PLAVIX) 75 MG tablet Take 75 mg by mouth daily.     co-enzyme Q-10 50 MG capsule Take 100 mg by mouth.      DULoxetine (CYMBALTA) 30 MG capsule Take by mouth daily.     enalapril (VASOTEC) 5 MG tablet Take 5 mg by mouth daily.     metoprolol tartrate (LOPRESSOR) 25 MG tablet Take 25 mg by mouth daily.      Omega-3 Fatty Acids (FISH OIL) 1000 MG CAPS Take 1,000 mg by mouth daily.     TURMERIC PO Take 1,950 mg by mouth.     Ascorbic Acid (VITAMIN C) 1000 MG tablet Take 1,000 mg by mouth daily.     B Complex-C (B-COMPLEX WITH VITAMIN C) tablet Take 1 tablet by mouth daily.     citalopram (CELEXA) 20 MG tablet Take 20 mg by mouth daily.     clonazePAM (KLONOPIN) 0.5 MG tablet Take 0.5 mg by mouth daily.      isosorbide mononitrate (IMDUR) 30 MG 24 hr tablet Take 30 mg by mouth daily.     lisinopril (PRINIVIL,ZESTRIL) 10 MG tablet Take 10 mg by mouth daily.     meloxicam (MOBIC) 7.5 MG tablet Take 7.5 mg by mouth daily.     metFORMIN (GLUCOPHAGE) 500 MG tablet Take 500 mg by mouth daily  with breakfast.     No current facility-administered medications for this visit.       REVIEW OF SYSTEMS (Negative unless checked)  Constitutional: [] Weight loss  [] Fever  [] Chills Cardiac: [] Chest pain   [] Chest pressure   [] Palpitations   [] Shortness of breath when laying flat   [] Shortness of breath at rest   [x] Shortness of breath with exertion. Vascular:  [x] Pain in legs with walking   [] Pain in legs at rest   [] Pain in legs when laying flat   [x] Claudication   [] Pain in feet when walking  [] Pain in feet at rest  [] Pain in feet when laying flat   [] History of DVT   [] Phlebitis   [] Swelling in legs   [] Varicose veins   [] Non-healing ulcers Pulmonary:   [] Uses home oxygen   [] Productive cough   [] Hemoptysis   [] Wheeze  [] COPD   [] Asthma Neurologic:  [] Dizziness  [] Blackouts   [] Seizures   [] History of stroke   [] History of TIA  [] Aphasia   [] Temporary blindness   [] Dysphagia   [] Weakness or  numbness in arms   [] Weakness or numbness in legs Musculoskeletal:  [x] Arthritis   [] Joint swelling   [x] Joint pain   [] Low back pain Hematologic:  [] Easy bruising  [] Easy bleeding   [] Hypercoagulable state   [] Anemic  [] Hepatitis Gastrointestinal:  [] Blood in stool   [] Vomiting blood  [] Gastroesophageal reflux/heartburn   [] Abdominal pain Genitourinary:  [] Chronic kidney disease   [] Difficult urination  [] Frequent urination  [] Burning with urination   [] Hematuria Skin:  [] Rashes   [] Ulcers   [] Wounds Psychological:  [] History of anxiety   []  History of major depression.    Physical Exam BP 94/60 (BP Location: Right Arm)    Pulse 81    Resp 16    Wt 164 lb 3.2 oz (74.5 kg)    BMI 29.09 kg/m  Gen:  WD/WN, NAD.  Appears younger than stated age Head: [place]/AT, No temporalis wasting. Ear/Nose/Throat: Hearing grossly intact, nares w/o erythema or drainage, oropharynx w/o Erythema/Exudate Eyes: Conjunctiva clear, sclera non-icteric  Neck: trachea midline.  No JVD.  Pulmonary:  Good air movement, respirations not labored, no use of accessory muscles  Cardiac: RRR, no JVD Vascular:  Vessel Right Left  Radial Palpable Palpable                          DP  2+  2+  PT  1+  1+   Gastrointestinal:. No masses, surgical incisions, or scars. Musculoskeletal: M/S 5/5 throughout.  Extremities without ischemic changes.  No deformity or atrophy.  No significant lower extremity edema. Neurologic: Sensation grossly intact in extremities.  Symmetrical.  Speech is fluent. Motor exam as listed above. Psychiatric: Judgment intact, Mood & affect appropriate for pt's clinical situation. Dermatologic: No rashes or ulcers noted.  No cellulitis or open wounds.    Radiology Mm 3d Screen Breast Bilateral  Result Date: 12/04/2018 CLINICAL DATA:  Screening. EXAM: DIGITAL SCREENING BILATERAL MAMMOGRAM WITH TOMO AND CAD COMPARISON:  Previous exam(s). ACR Breast Density Category b: There are scattered areas of  fibroglandular density. FINDINGS: There are no findings suspicious for malignancy. Images were processed with CAD. IMPRESSION: No mammographic evidence of malignancy. A result letter of this screening mammogram will be mailed directly to the patient. RECOMMENDATION: Screening mammogram in one year. (Code:SM-B-01Y) BI-RADS CATEGORY  1: Negative. Electronically Signed   By: M.D.   On: 12/04/2018 16:36    Labs No results  found for this or any previous visit (from the past 2160 hour(s)).  Assessment/Plan:  Diabetes ([place]) blood glucose control important in reducing the progression of atherosclerotic disease. Also, involved in wound healing. On appropriate medications.   Essential hypertension blood pressure control important in reducing the progression of atherosclerotic disease. On appropriate oral medications.   CAD (coronary artery disease) Continue cardiac and antihypertensive medications as already ordered and reviewed, no changes at this time. Continue statin as ordered and reviewed, no changes at this time Nitrates PRN for chest pain   PAD (peripheral artery disease) ([place])  Her left ABI was interpreted as noncompressible on her right ABI was 0.89. At this point, the patient has relatively mild peripheral arterial disease without any limb threatening symptoms.  We discussed the pathophysiology and the natural history of peripheral arterial disease.  She is on appropriate medical therapy with aspirin, Plavix, and a statin agent.  We discussed that she has no limb threatening symptoms and immediate intervention is not required at this time.  I will plan to see her back in about 3 months with noninvasive studies to follow her disease.  She will contact our office with any worsening symptoms in the interim.      Leotis Pain 12/16/2018, 3:15 PM   This note was created with Dragon medical transcription system.  Any errors from dictation are unintentional.

## 2018-12-16 NOTE — Assessment & Plan Note (Signed)
blood glucose control important in reducing the progression of atherosclerotic disease. Also, involved in wound healing. On appropriate medications.  

## 2018-12-16 NOTE — Assessment & Plan Note (Signed)
blood pressure control important in reducing the progression of atherosclerotic disease. On appropriate oral medications.  

## 2018-12-16 NOTE — Patient Instructions (Signed)
Peripheral Vascular Disease  Peripheral vascular disease (PVD) is a disease of the blood vessels that are not part of your heart and brain. A simple term for PVD is poor circulation. In most cases, PVD narrows the blood vessels that carry blood from your heart to the rest of your body. This can reduce the supply of blood to your arms, legs, and internal organs, like your stomach or kidneys. However, PVD most often affects a person's lower legs and feet. Without treatment, PVD tends to get worse. PVD can also lead to acute ischemic limb. This is when an arm or leg suddenly cannot get enough blood. This is a medical emergency. Follow these instructions at home: Lifestyle  Do not use any products that contain nicotine or tobacco, such as cigarettes and e-cigarettes. If you need help quitting, ask your doctor.  Lose weight if you are overweight. Or, stay at a healthy weight as told by your doctor.  Eat a diet that is low in fat and cholesterol. If you need help, ask your doctor.  Exercise regularly. Ask your doctor for activities that are right for you. General instructions  Take over-the-counter and prescription medicines only as told by your doctor.  Take good care of your feet: ? Wear comfortable shoes that fit well. ? Check your feet often for any cuts or sores.  Keep all follow-up visits as told by your doctor This is important. Contact a doctor if:  You have cramps in your legs when you walk.  You have leg pain when you are at rest.  You have coldness in a leg or foot.  Your skin changes.  You are unable to get or have an erection (erectile dysfunction).  You have cuts or sores on your feet that do not heal. Get help right away if:  Your arm or leg turns cold, numb, and blue.  Your arms or legs become red, warm, swollen, painful, or numb.  You have chest pain.  You have trouble breathing.  You suddenly have weakness in your face, arm, or leg.  You become very  confused or you cannot speak.  You suddenly have a very bad headache.  You suddenly cannot see. Summary  Peripheral vascular disease (PVD) is a disease of the blood vessels.  A simple term for PVD is poor circulation. Without treatment, PVD tends to get worse.  Treatment may include exercise, low fat and low cholesterol diet, and quitting smoking. This information is not intended to replace advice given to you by your health care provider. Make sure you discuss any questions you have with your health care provider. Document Released: 05/16/2009 Document Revised: 02/01/2017 Document Reviewed: 03/29/2016 Elsevier Patient Education  2020 [employer]  

## 2019-03-19 ENCOUNTER — Encounter (INDEPENDENT_AMBULATORY_CARE_PROVIDER_SITE_OTHER): Payer: Medicare HMO

## 2019-03-19 ENCOUNTER — Ambulatory Visit (INDEPENDENT_AMBULATORY_CARE_PROVIDER_SITE_OTHER): Payer: Medicare HMO | Admitting: Nurse Practitioner

## 2019-10-31 ENCOUNTER — Other Ambulatory Visit: Payer: Self-pay

## 2019-10-31 ENCOUNTER — Observation Stay: Payer: Medicare HMO

## 2019-10-31 ENCOUNTER — Observation Stay
Admission: EM | Admit: 2019-10-31 | Discharge: 2019-11-01 | Disposition: A | Discharge status: Home / Self Care | Payer: Medicare HMO | Attending: Internal Medicine | Admitting: Internal Medicine

## 2019-10-31 ENCOUNTER — Emergency Department: Payer: Medicare HMO

## 2019-10-31 ENCOUNTER — Encounter: Payer: Self-pay | Admitting: Emergency Medicine

## 2019-10-31 DIAGNOSIS — E669 Obesity, unspecified: Secondary | ICD-10-CM | POA: Diagnosis present

## 2019-10-31 DIAGNOSIS — Z7984 Long term (current) use of oral hypoglycemic drugs: Secondary | ICD-10-CM | POA: Insufficient documentation

## 2019-10-31 DIAGNOSIS — Z20822 Contact with and (suspected) exposure to covid-19: Secondary | ICD-10-CM | POA: Insufficient documentation

## 2019-10-31 DIAGNOSIS — I252 Old myocardial infarction: Secondary | ICD-10-CM | POA: Diagnosis not present

## 2019-10-31 DIAGNOSIS — T480X5A Adverse effect of oxytocic drugs, initial encounter: Secondary | ICD-10-CM | POA: Insufficient documentation

## 2019-10-31 DIAGNOSIS — T50905A Adverse effect of unspecified drugs, medicaments and biological substances, initial encounter: Secondary | ICD-10-CM | POA: Diagnosis present

## 2019-10-31 DIAGNOSIS — G92 Toxic encephalopathy: Secondary | ICD-10-CM | POA: Diagnosis not present

## 2019-10-31 DIAGNOSIS — Z79899 Other long term (current) drug therapy: Secondary | ICD-10-CM | POA: Diagnosis not present

## 2019-10-31 DIAGNOSIS — T428X5A Adverse effect of antiparkinsonism drugs and other central muscle-tone depressants, initial encounter: Secondary | ICD-10-CM | POA: Diagnosis not present

## 2019-10-31 DIAGNOSIS — I509 Heart failure, unspecified: Secondary | ICD-10-CM | POA: Diagnosis not present

## 2019-10-31 DIAGNOSIS — E1151 Type 2 diabetes mellitus with diabetic peripheral angiopathy without gangrene: Secondary | ICD-10-CM | POA: Diagnosis not present

## 2019-10-31 DIAGNOSIS — Z8249 Family history of ischemic heart disease and other diseases of the circulatory system: Secondary | ICD-10-CM | POA: Diagnosis not present

## 2019-10-31 DIAGNOSIS — I110 Hypertensive heart disease with heart failure: Secondary | ICD-10-CM | POA: Diagnosis not present

## 2019-10-31 DIAGNOSIS — R531 Weakness: Secondary | ICD-10-CM | POA: Diagnosis not present

## 2019-10-31 DIAGNOSIS — G9340 Encephalopathy, unspecified: Secondary | ICD-10-CM | POA: Diagnosis present

## 2019-10-31 DIAGNOSIS — Z5309 Procedure and treatment not carried out because of other contraindication: Secondary | ICD-10-CM

## 2019-10-31 DIAGNOSIS — R4182 Altered mental status, unspecified: Secondary | ICD-10-CM | POA: Diagnosis present

## 2019-10-31 DIAGNOSIS — Z7982 Long term (current) use of aspirin: Secondary | ICD-10-CM | POA: Diagnosis not present

## 2019-10-31 DIAGNOSIS — I10 Essential (primary) hypertension: Secondary | ICD-10-CM | POA: Diagnosis present

## 2019-10-31 DIAGNOSIS — E119 Type 2 diabetes mellitus without complications: Secondary | ICD-10-CM

## 2019-10-31 DIAGNOSIS — I2510 Atherosclerotic heart disease of native coronary artery without angina pectoris: Secondary | ICD-10-CM | POA: Diagnosis present

## 2019-10-31 LAB — CBC WITH DIFFERENTIAL/PLATELET
Abs Immature Granulocytes: 0.01 10*3/uL (ref 0.00–0.07)
Basophils Absolute: 0.1 10*3/uL (ref 0.0–0.1)
Basophils Relative: 1 %
Eosinophils Absolute: 0.6 10*3/uL — ABNORMAL HIGH (ref 0.0–0.5)
Eosinophils Relative: 10 %
HCT: 39 % (ref 36.0–46.0)
Hemoglobin: 12.9 g/dL (ref 12.0–15.0)
Immature Granulocytes: 0 %
Lymphocytes Relative: 18 %
Lymphs Abs: 1.1 10*3/uL (ref 0.7–4.0)
MCH: 30.3 pg (ref 26.0–34.0)
MCHC: 33.1 g/dL (ref 30.0–36.0)
MCV: 91.5 fL (ref 80.0–100.0)
Monocytes Absolute: 0.7 10*3/uL (ref 0.1–1.0)
Monocytes Relative: 11 %
Neutro Abs: 3.7 10*3/uL (ref 1.7–7.7)
Neutrophils Relative %: 60 %
Platelets: 274 10*3/uL (ref 150–400)
RBC: 4.26 MIL/uL (ref 3.87–5.11)
RDW: 13 % (ref 11.5–15.5)
WBC: 6.2 10*3/uL (ref 4.0–10.5)
nRBC: 0 % (ref 0.0–0.2)

## 2019-10-31 LAB — COMPREHENSIVE METABOLIC PANEL
ALT: 27 U/L (ref 0–44)
AST: 30 U/L (ref 15–41)
Albumin: 4.1 g/dL (ref 3.5–5.0)
Alkaline Phosphatase: 46 U/L (ref 38–126)
Anion gap: 13 (ref 5–15)
BUN: 30 mg/dL — ABNORMAL HIGH (ref 8–23)
CO2: 24 mmol/L (ref 22–32)
Calcium: 8.7 mg/dL — ABNORMAL LOW (ref 8.9–10.3)
Chloride: 102 mmol/L (ref 98–111)
Creatinine, Ser: 1.3 mg/dL — ABNORMAL HIGH (ref 0.44–1.00)
GFR calc Af Amer: 44 mL/min — ABNORMAL LOW (ref >60)
GFR calc non Af Amer: 38 mL/min — ABNORMAL LOW (ref >60)
Glucose, Bld: 138 mg/dL — ABNORMAL HIGH (ref 70–99)
Potassium: 4.1 mmol/L (ref 3.5–5.1)
Sodium: 139 mmol/L (ref 135–145)
Total Bilirubin: 1 mg/dL (ref 0.3–1.2)
Total Protein: 6.9 g/dL (ref 6.5–8.1)

## 2019-10-31 LAB — URINALYSIS, COMPLETE (UACMP) WITH MICROSCOPIC
Bacteria, UA: NONE SEEN
Bilirubin Urine: NEGATIVE
Glucose, UA: NEGATIVE mg/dL
Hgb urine dipstick: NEGATIVE
Ketones, ur: NEGATIVE mg/dL
Leukocytes,Ua: NEGATIVE
Nitrite: NEGATIVE
Protein, ur: NEGATIVE mg/dL
Specific Gravity, Urine: 1.013 (ref 1.005–1.030)
Squamous Epithelial / HPF: NONE SEEN (ref 0–5)
pH: 7 (ref 5.0–8.0)

## 2019-10-31 LAB — URINE DRUG SCREEN, QUALITATIVE (ARMC ONLY)
Amphetamines, Ur Screen: NOT DETECTED
Barbiturates, Ur Screen: NOT DETECTED
Benzodiazepine, Ur Scrn: NOT DETECTED
Cannabinoid 50 Ng, Ur ~~LOC~~: NOT DETECTED
Cocaine Metabolite,Ur ~~LOC~~: NOT DETECTED
MDMA (Ecstasy)Ur Screen: NOT DETECTED
Methadone Scn, Ur: NOT DETECTED
Opiate, Ur Screen: NOT DETECTED
Phencyclidine (PCP) Ur S: NOT DETECTED
Tricyclic, Ur Screen: NOT DETECTED

## 2019-10-31 LAB — AMMONIA: Ammonia: 11 umol/L (ref 9–35)

## 2019-10-31 LAB — SARS CORONAVIRUS 2 BY RT PCR (HOSPITAL ORDER, PERFORMED IN ~~LOC~~ HOSPITAL LAB): SARS Coronavirus 2: NEGATIVE

## 2019-10-31 MED ORDER — ONDANSETRON HCL 4 MG PO TABS: 4.0000 mg | ORAL_TABLET | Freq: Four times a day (QID) | ORAL | Status: DC | PRN

## 2019-10-31 MED ORDER — TRAMADOL HCL 50 MG PO TABS
50.0000 mg | ORAL_TABLET | Freq: Three times a day (TID) | ORAL | Status: DC | PRN
  Administered 2019-10-31: 50 mg via ORAL
  Filled 2019-10-31: qty 1

## 2019-10-31 MED ORDER — ONDANSETRON HCL 4 MG/2ML IJ SOLN: 4.0000 mg | Freq: Four times a day (QID) | INTRAMUSCULAR | Status: DC | PRN

## 2019-10-31 MED ORDER — ASPIRIN 81 MG PO CHEW
81.0000 mg | CHEWABLE_TABLET | Freq: Once | ORAL | Status: AC
  Administered 2019-10-31: 81 mg via ORAL
  Filled 2019-10-31: qty 1

## 2019-10-31 MED ORDER — ENOXAPARIN SODIUM 40 MG/0.4ML ~~LOC~~ SOLN
40.0000 mg | SUBCUTANEOUS | Status: DC
  Administered 2019-10-31: 40 mg via SUBCUTANEOUS
  Filled 2019-10-31: qty 0.4

## 2019-10-31 MED ORDER — SODIUM CHLORIDE 0.9 % IV SOLN: Freq: Once | INTRAVENOUS | Status: AC

## 2019-10-31 NOTE — ED Notes (Signed)
Pt's friend Suanne Marker updated, advised her that we cannot give any test results over the phone, but was informed that the patient is stable and currently in MRI.    Pamela's phone number: [phone]

## 2019-10-31 NOTE — ED Notes (Signed)
Returned from MRI 

## 2019-10-31 NOTE — ED Triage Notes (Addendum)
Pt via EMS from home. Friends checked on her last night where she was fine and at her baseline. When her friends came to check on her this AM pt was disoriented and having repetitive speech. On arrival, pt is alert and oriented to self only. Pt is able to follow commands but has repetitive speech. Per friends, pt was recently prescribed Oxycodone and a muscle relaxant from an injury from the beginning of the month.

## 2019-10-31 NOTE — ED Provider Notes (Signed)
[facility] Emergency Department Provider Note    First MD Initiated Contact with Patient 10/31/19 1327     (approximate)  I have reviewed the triage vital signs and the nursing notes.   HISTORY  Chief Complaint Altered Mental Status  Level V Caveat:  AMS  HPI Gail Paul is a 86 y.o. female the below listed past medical history presents to the ER for evaluation of altered mental status with trouble finding words starting this morning feeling very weak as well.  Felt well yesterday.  Was checked on by neighbors who check on her daily and stated that she was altered this morning and brought her to the ER.  She did recently start new medication including OxyContin and Flexeril.    Past Medical History:  Diagnosis Date  . CHF (congestive heart failure) (HCC)   . Heart attack (HCC)   . Hypertension    Family History  Problem Relation Age of Onset  . Deep vein thrombosis Mother   . Stroke Mother   . Hypertension Father    Past Surgical History:  Procedure Laterality Date  . BACK SURGERY    . carpel tunnel surgery    . CATARACT EXTRACTION     Patient Active Problem List   Diagnosis Date Noted  . Diabetes (HCC) 12/16/2018  . Essential hypertension 12/16/2018  . CAD (coronary artery disease) 12/16/2018  . Full thickness rotator cuff tear 12/16/2018  . Radial styloid tenosynovitis 12/16/2018  . Shoulder pain 12/16/2018  . Sprain of rotator cuff capsule 12/16/2018  . Stiffness of shoulder joint 12/16/2018  . PAD (peripheral artery disease) (HCC) 12/16/2018      Prior to Admission medications   Medication Sig Start Date End Date Taking? Authorizing Provider  Ascorbic Acid (VITAMIN C) 1000 MG tablet Take 1,000 mg by mouth daily.    [provider]  aspirin 81 MG tablet Take 81 mg by mouth daily.    [provider]  atorvastatin (LIPITOR) 80 MG tablet Take 80 mg by mouth daily.    [provider]  B Complex-C  (B-COMPLEX WITH VITAMIN C) tablet Take 1 tablet by mouth daily.    [provider]  busPIRone (BUSPAR) 10 MG tablet buspirone 10 mg tablet    [provider]  cetirizine (ZYRTEC) 10 MG tablet Take 10 mg by mouth daily.    [provider]  cholecalciferol (VITAMIN D) 1000 UNITS tablet Take 1,000 Units by mouth daily.    [provider]  citalopram (CELEXA) 20 MG tablet Take 20 mg by mouth daily.    [provider]  clonazePAM (KLONOPIN) 0.5 MG tablet Take 0.5 mg by mouth daily.     [provider]  clopidogrel (PLAVIX) 75 MG tablet Take 75 mg by mouth daily.    [provider]  co-enzyme Q-10 50 MG capsule Take 100 mg by mouth.     [provider]  DULoxetine (CYMBALTA) 30 MG capsule Take by mouth daily. 09/07/18   [provider]  enalapril (VASOTEC) 5 MG tablet Take 5 mg by mouth daily. 12/01/18   [provider]  isosorbide mononitrate (IMDUR) 30 MG 24 hr tablet Take 30 mg by mouth daily.    [provider]  lisinopril (PRINIVIL,ZESTRIL) 10 MG tablet Take 10 mg by mouth daily.    [provider]  meloxicam (MOBIC) 7.5 MG tablet Take 7.5 mg by mouth daily.    [provider]  metFORMIN (GLUCOPHAGE) 500 MG  tablet Take 500 mg by mouth daily with breakfast.    [provider]  metoprolol tartrate (LOPRESSOR) 25 MG tablet Take 25 mg by mouth daily.     [provider]  Omega-3 Fatty Acids (FISH OIL) 1000 MG CAPS Take 1,000 mg by mouth daily.    [provider]  TURMERIC PO Take 1,950 mg by mouth.    [provider]    Allergies Shellfish allergy    Social History Social History   Tobacco Use  . Smoking status: Never Smoker  . Smokeless tobacco: Never Used  Substance Use Topics  . Alcohol use: No  . Drug use: No    Review of Systems Patient denies headaches, rhinorrhea, blurry vision, numbness, shortness of breath, chest pain, edema,  cough, abdominal pain, nausea, vomiting, diarrhea, dysuria, fevers, rashes or hallucinations unless otherwise stated above in HPI. ____________________________________________   PHYSICAL EXAM:  VITAL SIGNS: Vitals:   10/31/19 1336  BP: (!) 190/83  Pulse: 81  Resp: 18  Temp: (!) 97.5 F (36.4 C)  SpO2: 100%    Constitutional: Alert, disoriented, repeatedly saying "37" Eyes: Conjunctivae are normal.  Head: Atraumatic. Nose: No congestion/rhinnorhea. Mouth/Throat: Mucous membranes are moist.   Neck: No stridor. Painless ROM.  Cardiovascular: Normal rate, regular rhythm. Grossly normal heart sounds.  Good peripheral circulation. Respiratory: Normal respiratory effort.  No retractions. Lungs CTAB. Gastrointestinal: Soft and nontender. No distention. No abdominal bruits. No CVA tenderness. Genitourinary:  Musculoskeletal: No lower extremity tenderness nor edema.  No joint effusions. Neurologic:  Slurred speech, MAE spontaneously  No facial droop Skin:  Skin is warm, dry and intact. No rash noted. Psychiatric: Mood and affect are normal. Speech and behavior are normal.  ____________________________________________   LABS (all labs ordered are listed, but only abnormal results are displayed)  Results for orders placed or performed during the hospital encounter of 10/31/19 (from the past 24 hour(s))  CBC with Differential/Platelet     Status: Abnormal   Collection Time: 10/31/19  1:35 PM  Result Value Ref Range   WBC 6.2 4.0 - 10.5 K/uL   RBC 4.26 3.87 - 5.11 MIL/uL   Hemoglobin 12.9 12.0 - 15.0 g/dL   HCT 22.9 36 - 46 %   MCV 91.5 80.0 - 100.0 fL   MCH 30.3 26.0 - 34.0 pg   MCHC 33.1 30.0 - 36.0 g/dL   RDW 79.8 92.1 - 19.4 %   Platelets 274 150 - 400 K/uL   nRBC 0.0 0.0 - 0.2 %   Neutrophils Relative % 60 %   Neutro Abs 3.7 1.7 - 7.7 K/uL   Lymphocytes Relative 18 %   Lymphs Abs 1.1 0.7 - 4.0 K/uL   Monocytes Relative 11 %   Monocytes Absolute 0.7 0 - 1 K/uL    Eosinophils Relative 10 %   Eosinophils Absolute 0.6 (H) 0 - 0 K/uL   Basophils Relative 1 %   Basophils Absolute 0.1 0 - 0 K/uL   Immature Granulocytes 0 %   Abs Immature Granulocytes 0.01 0.00 - 0.07 K/uL  Comprehensive metabolic panel     Status: Abnormal   Collection Time: 10/31/19  1:35 PM  Result Value Ref Range   Sodium 139 135 - 145 mmol/L   Potassium 4.1 3.5 - 5.1 mmol/L   Chloride 102 98 - 111 mmol/L   CO2 24 22 - 32 mmol/L   Glucose, Bld 138 (H) 70 - 99 mg/dL   BUN 30 (H) 8 - 23 mg/dL  Creatinine, Ser 1.30 (H) 0.44 - 1.00 mg/dL   Calcium 8.7 (L) 8.9 - 10.3 mg/dL   Total Protein 6.9 6.5 - 8.1 g/dL   Albumin 4.1 3.5 - 5.0 g/dL   AST 30 15 - 41 U/L   ALT 27 0 - 44 U/L   Alkaline Phosphatase 46 38 - 126 U/L   Total Bilirubin 1.0 0.3 - 1.2 mg/dL   GFR calc non Af Amer 38 (L) >60 mL/min   GFR calc Af Amer 44 (L) >60 mL/min   Anion gap 13 5 - 15  Ammonia     Status: None   Collection Time: 10/31/19  1:35 PM  Result Value Ref Range   Ammonia 11 9 - 35 umol/L  Urine Drug Screen, Qualitative ([facility] only)     Status: None   Collection Time: 10/31/19  1:35 PM  Result Value Ref Range   Tricyclic, Ur Screen NONE DETECTED NONE DETECTED   Amphetamines, Ur Screen NONE DETECTED NONE DETECTED   MDMA (Ecstasy)Ur Screen NONE DETECTED NONE DETECTED   Cocaine Metabolite,Ur [place] NONE DETECTED NONE DETECTED   Opiate, Ur Screen NONE DETECTED NONE DETECTED   Phencyclidine (PCP) Ur S NONE DETECTED NONE DETECTED   Cannabinoid 50 Ng, Ur [place] NONE DETECTED NONE DETECTED   Barbiturates, Ur Screen NONE DETECTED NONE DETECTED   Benzodiazepine, Ur Scrn NONE DETECTED NONE DETECTED   Methadone Scn, Ur NONE DETECTED NONE DETECTED  Urinalysis, Complete w Microscopic Urine, Catheterized     Status: Abnormal   Collection Time: 10/31/19  1:35 PM  Result Value Ref Range   Color, Urine YELLOW (A) YELLOW   APPearance CLEAR (A) CLEAR   Specific Gravity, Urine 1.013 1.005 - 1.030   pH 7.0 5.0 - 8.0    Glucose, UA NEGATIVE NEGATIVE mg/dL   Hgb urine dipstick NEGATIVE NEGATIVE   Bilirubin Urine NEGATIVE NEGATIVE   Ketones, ur NEGATIVE NEGATIVE mg/dL   Protein, ur NEGATIVE NEGATIVE mg/dL   Nitrite NEGATIVE NEGATIVE   Leukocytes,Ua NEGATIVE NEGATIVE   RBC / HPF 0-5 0 - 5 RBC/hpf   WBC, UA 0-5 0 - 5 WBC/hpf   Bacteria, UA NONE SEEN NONE SEEN   Squamous Epithelial / LPF NONE SEEN 0 - 5   ____________________________________________  EKG My review and personal interpretation at Time: 14:02   Indication: weakness  Rate: 70  Rhythm: sinus Axis: normal Other: normal intervals, no stemi ____________________________________________  RADIOLOGY  I personally reviewed all radiographic images ordered to evaluate for the above acute complaints and reviewed radiology reports and findings.  These findings were personally discussed with the patient.  Please see medical record for radiology report.  ____________________________________________   PROCEDURES  Procedure(s) performed:  Procedures    Critical Care performed: no ____________________________________________   INITIAL IMPRESSION / ASSESSMENT AND PLAN / ED COURSE  Pertinent labs & imaging results that were available during my care of the patient were reviewed by me and considered in my medical decision making (see chart for details).   DDX: Dehydration, sepsis, pna, uti, hypoglycemia, cva, drug effect, withdrawal, encephalitis   Gail Paul is a 86 y.o. who presents to the ED with symptoms as described above.  Patient is altered.  Mildly hypertensive.  Does have a history of CVA reportedly but I suspect on presentation given her history this is polypharmacy.  Blood will be sent for by differential.  Doubt infectious process.   Clinical Course as of Oct 31 1526  Sat Oct 31, 2019  1501 Patient  reassessed.  She is more alert now.  She is still hypertensive.  Still feels confused.  Slightly better than on arrival.  CT head shows  no acute abnormality.  I do feel that given her age and risk factors MRI would be warranted though this clinic is most likely secondary to polypharmacy.  Given her age presentation will discuss with hospitalist for observation hold meds and reassess.   [PR]    Clinical Course User Index [PR] Willy Eddy, MD    The patient was evaluated in Emergency Department today for the symptoms described in the history of present illness. He/she was evaluated in the context of the global COVID-19 pandemic, which necessitated consideration that the patient might be at risk for infection with the SARS-CoV-2 virus that causes COVID-19. Institutional protocols and algorithms that pertain to the evaluation of patients at risk for COVID-19 are in a state of rapid change based on information released by regulatory bodies including the CDC and federal and state organizations. These policies and algorithms were followed during the patient's care in the ED.  As part of my medical decision making, I reviewed the following data within the electronic MEDICAL RECORD NUMBER Nursing notes reviewed and incorporated, Labs reviewed, notes from prior ED visits and [place] Controlled Substance Database   ____________________________________________   FINAL CLINICAL IMPRESSION(S) / ED DIAGNOSES  Final diagnoses:  Acute encephalopathy      NEW MEDICATIONS STARTED DURING THIS VISIT:  New Prescriptions   No medications on file     Note:  This document was prepared using Dragon voice recognition software and may include unintentional dictation errors.    Willy Eddy, MD 10/31/19 [phone]

## 2019-10-31 NOTE — ED Notes (Signed)
Patient transported to MRI 

## 2019-10-31 NOTE — H&P (Signed)
[facility] [place] [facility]   PATIENT NAME: Gail Paul    MR#:  [id number]  DATE OF BIRTH:  06/05/1933  DATE OF ADMISSION:  10/31/2019  PRIMARY CARE PHYSICIAN: Margaretann Loveless, MD   REQUESTING/REFERRING PHYSICIAN: Dr. Roxan Hockey  Patient coming from : home   CHIEF COMPLAINT:  altered mental status difficulty finding words  history is obtained from patient's friend who is at bedside and patient  HISTORY OF PRESENT ILLNESS:  Gail Paul  is a 86 y.o. female with a known history of congestive heart failure, hypertension, hyperlipidemia, depression CAD comes to the emergency room after she was found confused by her friend who checks on her on a daily basis Patient recently pulled her muscle in her right shoulder and was prescribed baclofen, gabapentin and OxyContin by PCP yesterday. Patient took gabapentin and baclofen last night. She had difficulty word finding and was confused. Friend brought her to the emergency room  ED course: in the ER patient is hemodynamically stable except blood pressure 190/83  she is alert oriented times three. CT head negative. Overall feels improved. Received aspirin 81 mg. MRI of the brain pending  patient is being admitted with acute encephalopathy suspected due to polypharmacy rule out TIA.  COVID pending. PAST MEDICAL HISTORY:   Past Medical History:  Diagnosis Date  . CHF (congestive heart failure) (HCC)   . Heart attack (HCC)   . Hypertension     PAST SURGICAL HISTOIRY:   Past Surgical History:  Procedure Laterality Date  . BACK SURGERY    . carpel tunnel surgery    . CATARACT EXTRACTION      SOCIAL HISTORY:   Social History   Tobacco Use  . Smoking status: Never Smoker  . Smokeless tobacco: Never Used  Substance Use Topics  . Alcohol use: No    FAMILY HISTORY:   Family History  Problem Relation Age of Onset  . Deep vein thrombosis Mother   . Stroke Mother   . Hypertension Father     DRUG  ALLERGIES:   Allergies  Allergen Reactions  . Shellfish Allergy Swelling    REVIEW OF SYSTEMS:  Review of Systems  Constitutional: Negative for chills, fever and weight loss.  HENT: Negative for ear discharge, ear pain and nosebleeds.   Eyes: Negative for blurred vision, pain and discharge.  Respiratory: Negative for sputum production, shortness of breath, wheezing and stridor.   Cardiovascular: Negative for chest pain, palpitations, orthopnea and PND.  Gastrointestinal: Negative for abdominal pain, diarrhea, nausea and vomiting.  Genitourinary: Negative for frequency and urgency.  Musculoskeletal: Negative for back pain and joint pain.  Neurological: Positive for weakness. Negative for sensory change, speech change and focal weakness.  Psychiatric/Behavioral: Negative for depression and hallucinations. The patient is nervous/anxious.      MEDICATIONS AT HOME:   Prior to Admission medications   Medication Sig Start Date End Date Taking? Authorizing Provider  Ascorbic Acid (VITAMIN C) 1000 MG tablet Take 1,000 mg by mouth daily.    [provider]  aspirin 81 MG tablet Take 81 mg by mouth daily.    [provider]  atorvastatin (LIPITOR) 80 MG tablet Take 80 mg by mouth daily.    [provider]  B Complex-C (B-COMPLEX WITH VITAMIN C) tablet Take 1 tablet by mouth daily.    [provider]  busPIRone (BUSPAR) 10 MG tablet buspirone 10 mg tablet    [provider]  cetirizine (ZYRTEC) 10 MG tablet Take  10 mg by mouth daily.    [provider]  cholecalciferol (VITAMIN D) 1000 UNITS tablet Take 1,000 Units by mouth daily.    [provider]  citalopram (CELEXA) 20 MG tablet Take 20 mg by mouth daily.    [provider]  clonazePAM (KLONOPIN) 0.5 MG tablet Take 0.5 mg by mouth daily.     [provider]  clopidogrel (PLAVIX) 75 MG tablet Take 75 mg by mouth daily.    [provider]  co-enzyme  Q-10 50 MG capsule Take 100 mg by mouth.     [provider]  DULoxetine (CYMBALTA) 30 MG capsule Take by mouth daily. 09/07/18   [provider]  enalapril (VASOTEC) 5 MG tablet Take 5 mg by mouth daily. 12/01/18   [provider]  isosorbide mononitrate (IMDUR) 30 MG 24 hr tablet Take 30 mg by mouth daily.    [provider]  lisinopril (PRINIVIL,ZESTRIL) 10 MG tablet Take 10 mg by mouth daily.    [provider]  meloxicam (MOBIC) 7.5 MG tablet Take 7.5 mg by mouth daily.    [provider]  metFORMIN (GLUCOPHAGE) 500 MG tablet Take 500 mg by mouth daily with breakfast.    [provider]  metoprolol tartrate (LOPRESSOR) 25 MG tablet Take 25 mg by mouth daily.     [provider]  Omega-3 Fatty Acids (FISH OIL) 1000 MG CAPS Take 1,000 mg by mouth daily.    [provider]  TURMERIC PO Take 1,950 mg by mouth.    [provider]      VITAL SIGNS:  Blood pressure (!) 190/83, pulse 81, temperature (!) 97.5 F (36.4 C), temperature source Oral, resp. rate 18, height 5\' 3"  (1.6 m), weight 81.6 kg, SpO2 100 %.  PHYSICAL EXAMINATION:  GENERAL:  86 y.o.-year-old patient lying in the bed with no acute distress.  EYES: Pupils equal, round, reactive to light and accommodation. No scleral icterus.  HEENT: Head atraumatic, normocephalic. Oropharynx and nasopharynx clear.  NECK:  Supple, no jugular venous distention. No thyroid enlargement, no tenderness.  LUNGS: Normal breath sounds bilaterally, no wheezing, rales,rhonchi or crepitation. No use of accessory muscles of respiration.  CARDIOVASCULAR: S1, S2 normal. No murmurs, rubs, or gallops.  ABDOMEN: Soft, nontender, nondistended. Bowel sounds present. No organomegaly or mass.  EXTREMITIES: No pedal edema, cyanosis, or clubbing.  NEUROLOGIC: Cranial nerves II through XII are intact. Muscle strength 5/5 in all extremities. Sensation intact. Gait not checked.   PSYCHIATRIC: The patient is alert and oriented x 3.  SKIN: No obvious rash, lesion, or ulcer.   LABORATORY PANEL:   CBC Recent Labs  Lab 10/31/19 1335  WBC 6.2  HGB 12.9  HCT 39.0  PLT 274   ------------------------------------------------------------------------------------------------------------------  Chemistries  Recent Labs  Lab 10/31/19 1335  NA 139  K 4.1  CL 102  CO2 24  GLUCOSE 138*  BUN 30*  CREATININE 1.30*  CALCIUM 8.7*  AST 30  ALT 27  ALKPHOS 46  BILITOT 1.0   ------------------------------------------------------------------------------------------------------------------  Cardiac Enzymes No results for input(s): TROPONINI in the last 168 hours. ------------------------------------------------------------------------------------------------------------------  RADIOLOGY:  CT Head Wo Contrast  Result Date: 10/31/2019 CLINICAL DATA:  Altered mental status. EXAM: CT HEAD WITHOUT CONTRAST TECHNIQUE: Contiguous axial images were obtained from the base of the skull through the vertex without intravenous contrast. COMPARISON:  None. FINDINGS: Brain: No evidence of acute infarction, hemorrhage, hydrocephalus, extra-axial collection or mass lesion/mass effect. Periventricular white matter hypoattenuation likely represents  chronic small vessel ischemic disease. Vascular: There are vascular calcifications in the carotid siphons. Skull: Normal. Negative for fracture or focal lesion. Sinuses/Orbits: No acute finding. Other: None. IMPRESSION: No acute intracranial process. Electronically Signed   By: Romona Curls M.D.   On: 10/31/2019 14:40   DG Chest Portable 1 View  Result Date: 10/31/2019 CLINICAL DATA:  Altered mental status EXAM: PORTABLE CHEST 1 VIEW COMPARISON:  Chest radiograph dated 12/21/2012. FINDINGS: The heart size and mediastinal contours are within normal limits. Both lungs are clear. There is diffuse osseous demineralization. Degenerative changes are  seen in the spine in both shoulders. IMPRESSION: No active disease. Electronically Signed   By: Romona Curls M.D.   On: 10/31/2019 14:41    EKG:    IMPRESSION AND PLAN:   Williemae Muriel  is a 86 y.o. female with a known history of congestive heart failure, hypertension, hyperlipidemia, depression CAD comes to the emergency room after she was found confused by her friend who checks on her on a daily basis  Acute encephalopathy Polypharmacy (baclofen, gabapentin, narcotics) rule out CVA/TIA -admit to medical floor -81 mg aspirin daily -MRI of the brain-- pending -patient appears to be clearing more with her mentation during my evaluation  Right shoulder sprain -PRN tramadol/Tylenol -consider topical diclofenac  CAD continue aspirin, Lipitor, metoprolol-- once medication reconciliation is done  hyperlipidemia continue statins and Omega three fatty acid   Family Communication : friend in the ER Consults : none Code Status : DNR prior to arrival discussed with patient DVT prophylaxis : Lovenox  TOTAL TIME TAKING CARE OF THIS PATIENT: 45* minutes.    Enedina Finner M.D  [facility]     CC: Primary care physician; Margaretann Loveless, MD

## 2019-10-31 NOTE — ED Notes (Signed)
Pt transported to CT at this time.

## 2019-10-31 NOTE — ED Notes (Addendum)
Pt c/o right lower leg cramp, pt unable to lay still in bed, warm blanket applied, trying to get patient to relax and reposition. Pt unable to sit still. Offered pt some tramadol but declined. Pt hyperventilating at this time.

## 2019-11-01 DIAGNOSIS — E669 Obesity, unspecified: Secondary | ICD-10-CM | POA: Diagnosis not present

## 2019-11-01 DIAGNOSIS — T50905A Adverse effect of unspecified drugs, medicaments and biological substances, initial encounter: Secondary | ICD-10-CM | POA: Diagnosis present

## 2019-11-01 DIAGNOSIS — I10 Essential (primary) hypertension: Secondary | ICD-10-CM | POA: Diagnosis not present

## 2019-11-01 DIAGNOSIS — G9340 Encephalopathy, unspecified: Secondary | ICD-10-CM | POA: Diagnosis not present

## 2019-11-01 NOTE — Discharge Summary (Signed)
Discharge Summary  Gail Paul RDE:[id number] DOB: 1933/10/17  PCP: Margaretann Loveless, MD  Admit date: 10/31/2019 Discharge date: 11/01/2019  Time spent: 25 minutes  Recommendations for Outpatient Follow-up:  1. Medication changes: Patient states confusion and weakness came from when she took baclofen and Neurontin together along with OxyContin.  I have advised her to discontinue the baclofen and OxyContin. 2. Please note that patient states that she is no longer taking any Metformin, Imdur or lisinopril by her own personal choice.  Because of her low blood pressure, I have put on her medication list to stop taking these medications, but will defer obviously to her primary care physician and cardiologist.  Discharge Diagnoses:  Active Hospital Problems   Diagnosis Date Noted  . Obesity (BMI 30-39.9) 11/01/2019  . Adverse drug interaction with prescription medication 11/01/2019  . Acute encephalopathy 10/31/2019  . Diabetes (HCC) 12/16/2018  . Essential hypertension 12/16/2018  . CAD (coronary artery disease) 12/16/2018    Resolved Hospital Problems  No resolved problems to display.    Discharge Condition: Improved, being discharged home  Diet recommendation: Heart healthy  Vitals:   10/31/19 2112 11/01/19 1032  BP: (!) 157/72 (!) 145/67  Pulse: 82 82  Resp: 13 14  Temp:    SpO2: 96% 97%    History of present illness:  86 year old female with past medical history of diastolic heart failure, hypertension, depression who had had a recent pulled muscle in her right shoulder and was prescribed baclofen, oxycodone and Ultram by her PCP the day prior.  Patient states that she took the baclofen and gabapentin last night which led to her feeling very out of sorts.  She was brought in by a neighbor after she was found to be confused.  The emergency room, CT scan unremarkable.  Patient was given fluids and brought in for further evaluation.   Hospital Course:  Active Problems:    Diabetes [facility]): As above, patient is not on Metformin as per her choice.  Unclear if her PCP is aware of this choice.   Essential hypertension: Overall stable.  Continue home medications, as noted above, patient states that she is not on Imdur or lisinopril as per her choice.  Unclear if her PCP is aware of this.    CAD (coronary artery disease): Stable at this time.    Acute encephalopathy caused by adverse drug interaction with prescription medication: Following arrival to emergency room, they are holding those medications that seem to cause the issue, patient became more and more lucid.  MRI negative.  By 8/29 morning, patient alert and oriented x3.  Patient advised to discontinue OxyContin, baclofen.  She will follow up with her PCP this week.  She appeared to do okay when working with physical therapy although she may do better in a skilled nursing facility.  Have set up home health PT, RN and Child psychotherapist.    Obesity (BMI 30-39.9): Meets criteria for BMI greater than 30.   Procedures:  None  Consultations:  None  Discharge Exam: BP (!) 145/67   Pulse 82   Temp (!) 97.5 F (36.4 C) (Oral)   Resp 14   Ht 5\' 3"  (1.6 m)   Wt 81.6 kg   SpO2 97%   BMI 31.89 kg/m   General: Alert and oriented x3, no acute distress Cardiovascular: Regular rate and rhythm, S1-S2 Respiratory: Clear to auscultation bilaterally  Discharge Instructions You were cared for by a hospitalist during your hospital stay. If you have any  questions about your discharge medications or the care you received while you were in the hospital after you are discharged, you can call the unit and asked to speak with the hospitalist on call if the hospitalist that took care of you is not available. Once you are discharged, your primary care physician will handle any further medical issues. Please note that NO REFILLS for any discharge medications will be authorized once you are discharged, as it is imperative that you  return to your primary care physician (or establish a relationship with a primary care physician if you do not have one) for your aftercare needs so that they can reassess your need for medications and monitor your lab values.  Discharge Instructions    Diet - low sodium heart healthy   Complete by: As directed    Increase activity slowly   Complete by: As directed      Allergies as of 11/01/2019      Reactions   Shellfish Allergy Swelling      Medication List    STOP taking these medications   baclofen 10 MG tablet Commonly known as: LIORESAL   isosorbide mononitrate 30 MG 24 hr tablet Commonly known as: IMDUR   lisinopril 10 MG tablet Commonly known as: ZESTRIL   metFORMIN 500 MG tablet Commonly known as: GLUCOPHAGE     TAKE these medications   amLODipine 5 MG tablet Commonly known as: NORVASC Take 5 mg by mouth daily.   aspirin 81 MG tablet Take 81 mg by mouth daily.   atorvastatin 80 MG tablet Commonly known as: LIPITOR Take 80 mg by mouth daily.   busPIRone 10 MG tablet Commonly known as: BUSPAR Take 10 mg by mouth 2 (two) times daily.   cetirizine 10 MG tablet Commonly known as: ZYRTEC Take 10 mg by mouth daily.   cholecalciferol 1000 units tablet Commonly known as: VITAMIN D Take 1,000 Units by mouth daily.   clopidogrel 75 MG tablet Commonly known as: PLAVIX Take 75 mg by mouth daily.   co-enzyme Q-10 50 MG capsule Take 100 mg by mouth.   cyclobenzaprine 10 MG tablet Commonly known as: FLEXERIL Take 10 mg by mouth 3 (three) times daily as needed.   DULoxetine 30 MG capsule Commonly known as: CYMBALTA Take 30 mg by mouth daily.   enalapril 5 MG tablet Commonly known as: VASOTEC Take 5 mg by mouth daily.   Fish Oil 1000 MG Caps Take 1,000 mg by mouth daily.   furosemide 20 MG tablet Commonly known as: LASIX Take 20 mg by mouth 3 (three) times a week.   gabapentin 100 MG capsule Commonly known as: NEURONTIN Take 100 mg by mouth 2  (two) times daily.   metoprolol succinate 25 MG 24 hr tablet Commonly known as: TOPROL-XL Take 25 mg by mouth daily.   metoprolol tartrate 25 MG tablet Commonly known as: LOPRESSOR Take 25 mg by mouth daily.   nitroGLYCERIN 0.4 MG SL tablet Commonly known as: NITROSTAT Place 0.4 mg under the tongue as directed.   pantoprazole 40 MG tablet Commonly known as: PROTONIX Take 40 mg by mouth daily.   TURMERIC PO Take 1,950 mg by mouth.      Allergies  Allergen Reactions  . Shellfish Allergy Swelling      The results of significant diagnostics from this hospitalization (including imaging, microbiology, ancillary and laboratory) are listed below for reference.    Significant Diagnostic Studies: DG Eye Foreign Body  Result Date: 10/31/2019 CLINICAL DATA:  Metal working/exposure; clearance prior to MRI EXAM: ORBITS FOR FOREIGN BODY - 2 VIEW COMPARISON:  None. FINDINGS: There is no evidence of metallic foreign body within the orbits. No significant bone abnormality identified. IMPRESSION: No evidence of metallic foreign body within the orbits. Electronically Signed   By: Elgie Collard M.D.   On: 10/31/2019 19:27   DG Abd 1 View  Result Date: 10/31/2019 CLINICAL DATA:  86 year old female with MRI contraindication due to metal implant. EXAM: ABDOMEN - 1 VIEW COMPARISON:  CT abdomen pelvis dated 04/19/2015. FINDINGS: There is no bowel dilatation or evidence of obstruction. Moderate stool noted throughout the colon. No free air or radiopaque calculi. Focus of calcification in the right flank as seen on the prior CT, likely related to prior fat necrosis. There is multilevel degenerative changes of the spine. Lower lumbar fusion hardware. There is degenerative changes of the hips. No acute osseous pathology. IMPRESSION: 1. No bowel obstruction. 2. Lower lumbar fusion hardware. Electronically Signed   By: Elgie Collard M.D.   On: 10/31/2019 19:30   CT Head Wo Contrast  Result Date:  10/31/2019 CLINICAL DATA:  Altered mental status. EXAM: CT HEAD WITHOUT CONTRAST TECHNIQUE: Contiguous axial images were obtained from the base of the skull through the vertex without intravenous contrast. COMPARISON:  None. FINDINGS: Brain: No evidence of acute infarction, hemorrhage, hydrocephalus, extra-axial collection or mass lesion/mass effect. Periventricular white matter hypoattenuation likely represents chronic small vessel ischemic disease. Vascular: There are vascular calcifications in the carotid siphons. Skull: Normal. Negative for fracture or focal lesion. Sinuses/Orbits: No acute finding. Other: None. IMPRESSION: No acute intracranial process. Electronically Signed   By: Romona Curls M.D.   On: 10/31/2019 14:40   MR BRAIN WO CONTRAST  Result Date: 10/31/2019 CLINICAL DATA:  Acute neuro deficit.  Confusion. EXAM: MRI HEAD WITHOUT CONTRAST TECHNIQUE: Multiplanar, multiecho pulse sequences of the brain and surrounding structures were obtained without intravenous contrast. COMPARISON:  CT head 10/31/2019 FINDINGS: Brain: Negative for acute infarct. Mild white matter changes consistent with chronic microvascular ischemia Mild atrophy. Negative for hydrocephalus. Negative for mass lesion. Probable developmental venous anomaly in the left cerebellum. No intracranial hemorrhage. Vascular: Normal arterial flow voids Skull and upper cervical spine: Negative Sinuses/Orbits: Air-fluid levels in the sphenoid sinus. Remaining sinuses clear. Bilateral cataract extraction. Other: None IMPRESSION: Negative for acute infarct. Mild chronic microvascular ischemic change in the white matter. Electronically Signed   By: Marlan Palau M.D.   On: 10/31/2019 21:11   DG Chest Portable 1 View  Result Date: 10/31/2019 CLINICAL DATA:  Altered mental status EXAM: PORTABLE CHEST 1 VIEW COMPARISON:  Chest radiograph dated 12/21/2012. FINDINGS: The heart size and mediastinal contours are within normal limits. Both lungs are  clear. There is diffuse osseous demineralization. Degenerative changes are seen in the spine in both shoulders. IMPRESSION: No active disease. Electronically Signed   By: Romona Curls M.D.   On: 10/31/2019 14:41    Microbiology: Recent Results (from the past 240 hour(s))  SARS Coronavirus 2 by RT PCR (hospital order, performed in [facility] hospital lab) Nasopharyngeal Nasopharyngeal Swab     Status: None   Collection Time: 10/31/19  4:09 PM   Specimen: Nasopharyngeal Swab  Result Value Ref Range Status   SARS Coronavirus 2 NEGATIVE NEGATIVE Final    Comment: (NOTE) SARS-CoV-2 target nucleic acids are NOT DETECTED.  The SARS-CoV-2 RNA is generally detectable in upper and lower respiratory specimens during the acute phase of infection. The lowest concentration of SARS-CoV-2 viral  copies this assay can detect is 250 copies / mL. A negative result does not preclude SARS-CoV-2 infection and should not be used as the sole basis for treatment or other patient management decisions.  A negative result may occur with improper specimen collection / handling, submission of specimen other than nasopharyngeal swab, presence of viral mutation(s) within the areas targeted by this assay, and inadequate number of viral copies (<250 copies / mL). A negative result must be combined with clinical observations, patient history, and epidemiological information.  Fact Sheet for Patients:   BoilerBrush.com.cyhttps://www.fda.gov/media/136312/download  Fact Sheet for Healthcare Providers: https://pope.com/https://www.fda.gov/media/136313/download  This test is not yet approved or  cleared by the [place] FDA and has been authorized for detection and/or diagnosis of SARS-CoV-2 by FDA under an Emergency Use Authorization (EUA).  This EUA will remain in effect (meaning this test can be used) for the duration of the COVID-19 declaration under Section 564(b)(1) of the Act, 21 U.S.C. section 360bbb-3(b)(1), unless the authorization is  terminated or revoked sooner.  Performed at [facility], [address] [place], [place] [address]      Labs: Basic Metabolic Panel: Recent Labs  Lab 10/31/19 1335  NA 139  K 4.1  CL 102  CO2 24  GLUCOSE 138*  BUN 30*  CREATININE 1.30*  CALCIUM 8.7*   Liver Function Tests: Recent Labs  Lab 10/31/19 1335  AST 30  ALT 27  ALKPHOS 46  BILITOT 1.0  PROT 6.9  ALBUMIN 4.1   No results for input(s): LIPASE, AMYLASE in the last 168 hours. Recent Labs  Lab 10/31/19 1335  AMMONIA 11   CBC: Recent Labs  Lab 10/31/19 1335  WBC 6.2  NEUTROABS 3.7  HGB 12.9  HCT 39.0  MCV 91.5  PLT 274   Cardiac Enzymes: No results for input(s): CKTOTAL, CKMB, CKMBINDEX, TROPONINI in the last 168 hours. BNP: BNP (last 3 results) No results for input(s): BNP in the last 8760 hours.  ProBNP (last 3 results) No results for input(s): PROBNP in the last 8760 hours.  CBG: No results for input(s): GLUCAP in the last 168 hours.     Signed:  Hollice EspySendil K Reka Wist, MD [facility] 11/01/2019, 2:09 PM

## 2019-11-01 NOTE — Evaluation (Addendum)
Physical Therapy Evaluation Patient Details Name: Gail Paul MRN: [id number] DOB: 08-18-1933 Today's Date: 11/01/2019   History of Present Illness  Gail Paul is an 25yoF who comes to [facility] via friend who found pt with AMS and word finding issues. Pt was recent presribed medications for an acute back strain with recurrent spasm. heart failure, hypertension, hyperlipidemia, depression CAD comes to the emergency room after she was found confused by her friend who checks on her on a daily basis  Clinical Impression  Pt admitted with above diagnosis. Pt currently with functional limitations due to the deficits listed below (see "PT Problem List"). Upon entry, pt in bed, awake and agreeable to participate. The pt is alert and oriented x4, pleasant, conversational, and generally a good historian, but does not often answer questions directly. Mentation appears to have returned to baseline, however pt has more concerns about being about to remain independent at home that are related to recent issues with immobilizing back spasm. Pt able to perform entire session this date without any aggravation of back pain. Functional mobility assessment demonstrates increased effort/time requirements, poor tolerance, and need for physical assistance, whereas the patient performed these at a similar level of independence PTA. Pt does endorse 1 week of inactivity s/p injury and feels this to be contributing to some difficulty with unsupported walking in session initially, but pt able to AMB and turn without and gross unsteadiness or LOB. Pt appears safe to DC to home from a PT standpoint, author encouraged patient to increase support/contact with social support until her back spasm is less concerning to acute and unexpected immobilization. Patient is near baseline, all education completed, and time is given to address all questions/concerns. No additional skilled PT services needed at this time, PT signing off. PT recommends  daily ambulation ad lib or with nursing staff as needed to prevent deconditioning.         Follow Up Recommendations Home health PT    Equipment Recommendations  None recommended by PT    Recommendations for Other Services       Precautions / Restrictions Precautions Precautions: Fall Restrictions Weight Bearing Restrictions: No      Mobility  Bed Mobility Overal bed mobility: Needs Assistance Bed Mobility: Supine to Sit;Sit to Supine     Supine to sit: Supervision Sit to supine: Supervision   General bed mobility comments: difficulty scooting, but otherwise can perform with supervision and heavy effort. Has been sleeping in recliner for the past week.  Transfers Overall transfer level: Needs assistance Equipment used: None;Rolling walker (2 wheeled) Transfers: Sit to/from Stand Sit to Stand: Modified independent (Device/Increase time)         General transfer comment: appears safe and confident  Ambulation/Gait Ambulation/Gait assistance: Modified independent (Device/Increase time) Gait Distance (Feet): 150 Feet Assistive device: Rolling walker (2 wheeled) Gait Pattern/deviations: WFL(Within Functional Limits)     General Gait Details: slow, steady confident, sometimes needs redierction for safety, at one point trying to push RW with one hand only whilst gesturing with the contrlateral hand  Stairs            Wheelchair Mobility    Modified Rankin (Stroke Patients Only)       Balance Overall balance assessment: Modified Independent                                           Pertinent  Vitals/Pain Pain Assessment: No/denies pain    Home Living Family/patient expects to be discharged to:: Private residence Living Arrangements: Alone Available Help at Discharge: Neighbor Type of Home: House Home Access: Stairs to enter   [employer] of Steps: 4 Home Layout: One level Home Equipment: Environmental consultant - 4 wheels;Cane -  single point      Prior Function Level of Independence: Needs assistance   Gait / Transfers Assistance Needed: difficulty with longer distances, but needs a cart for prolonged time.  ADL's / Homemaking Assistance Needed: independent        Hand Dominance        Extremity/Trunk Assessment   Upper Extremity Assessment Upper Extremity Assessment: Overall WFL for tasks assessed;Generalized weakness (chronic shoulder arhtropathy which limits functional strength.)    Lower Extremity Assessment Lower Extremity Assessment: Generalized weakness;Overall WFL for tasks assessed       Communication      Cognition Arousal/Alertness: Awake/alert Behavior During Therapy: WFL for tasks assessed/performed Overall Cognitive Status: Within Functional Limits for tasks assessed                                 General Comments: sometimes requires redirection for specific information.      General Comments      Exercises     Assessment/Plan    PT Assessment All further PT needs can be met in the next venue of care  PT Problem List Decreased activity tolerance;Decreased mobility       PT Treatment Interventions      PT Goals (Current goals can be found in the Care Plan section)  Acute Rehab PT Goals PT Goal Formulation: All assessment and education complete, DC therapy    Frequency     Barriers to discharge        Co-evaluation               AM-PAC PT "6 Clicks" Mobility  Outcome Measure Help needed turning from your back to your side while in a flat bed without using bedrails?: A Little Help needed moving from lying on your back to sitting on the side of a flat bed without using bedrails?: A Little Help needed moving to and from a bed to a chair (including a wheelchair)?: A Little Help needed standing up from a chair using your arms (e.g., wheelchair or bedside chair)?: A Little Help needed to walk in hospital room?: A Little Help needed climbing 3-5  steps with a railing? : A Little 6 Click Score: 18    End of Session Equipment Utilized During Treatment: Gait belt Activity Tolerance: Patient tolerated treatment well;No increased pain Patient left: in bed;with call bell/phone within reach;with nursing/sitter in room Nurse Communication: Mobility status PT Visit Diagnosis: Difficulty in walking, not elsewhere classified (R26.2)    Time: 8251-8984 PT Time Calculation (min) (ACUTE ONLY): 40 min   Charges:   PT Evaluation $PT Eval Moderate Complexity: 1 Mod PT Treatments $Therapeutic Exercise: 8-22 mins        3:02 PM, 11/01/19 Etta Grandchild, PT, DPT Physical Therapist - [facility]  [phone] ([place])   [place] [facility] 11/01/2019, 2:59 PM

## 2019-11-01 NOTE — TOC Initial Note (Signed)
Transition of Care [facility] - Initial/Assessment Note    Patient Details  Name: Gail Paul MRN: [id number] Date of Birth: 05-Dec-1933  Transition of Care [facility]) CM/SW Contact:    Larwance Rote, LCSW Phone Number: 11/01/2019, 12:43 PM  Clinical Narrative:  The patient is an 86 year old woman who presented to [facility] ED via EMS from home. Patient reported that she lives alone.  Has good friends that help her out. Family lives in [place] and has a cousin that lives in [place]. Has little contact with family. CSW discussed with patient the role of TOC CM/SW how she would be assisted during her stay at [facility]. Provided patient with information for DME and SNF. Patient is open to "whatever the doctor or PT recommends."    Social worker will continue to follow this patient and wait for recommendations on how to proceed with patient's discharge.    Expected Discharge Plan: Home w Home Health Services Barriers to Discharge: Continued Medical Work up   Patient Goals and CMS Choice   CMS Medicare.gov Compare Post Acute Care list provided to:: Patient Choice offered to / list presented to : Patient  Expected Discharge Plan and Services Expected Discharge Plan: Home w Home Health Services In-house Referral: Clinical Social Work   Post Acute Care Choice: Home Health                                        Prior Living Arrangements/Services   Lives with:: Self Patient language and need for interpreter reviewed:: No        Need for Family Participation in Patient Care: No (Comment) Care giver support system in place?: Yes (comment) (Friends)   Criminal Activity/Legal Involvement Pertinent to Current Situation/Hospitalization: No - Comment as needed  Activities of Daily Living      Permission Sought/Granted Permission sought to share information with : Case Manager, Family Supports Permission granted to share information with : Yes, Verbal Permission Granted  Share  Information with NAME: Terrial Rhodes (Friend) [phone] (Mobile)           Emotional Assessment Appearance:: Appears stated age Attitude/Demeanor/Rapport: Engaged Affect (typically observed): Appropriate Orientation: : Oriented to Self, Oriented to Place, Oriented to  Time, Oriented to Situation Alcohol / Substance Use: Not Applicable Psych Involvement: No (comment)  Admission diagnosis:  Acute encephalopathy [G93.40] Patient Active Problem List   Diagnosis Date Noted  . Acute encephalopathy 10/31/2019  . Diabetes (HCC) 12/16/2018  . Essential hypertension 12/16/2018  . CAD (coronary artery disease) 12/16/2018  . Full thickness rotator cuff tear 12/16/2018  . Radial styloid tenosynovitis 12/16/2018  . Shoulder pain 12/16/2018  . Sprain of rotator cuff capsule 12/16/2018  . Stiffness of shoulder joint 12/16/2018  . PAD (peripheral artery disease) (HCC) 12/16/2018   PCP:  Margaretann Loveless, MD Pharmacy:   [phone] [facility], [place] - [address] [place] [address] Phone: [phone] Fax: [phone]  [id number] - [place], [place] - [address] [place] [address] Phone: [phone] Fax: [phone]     Social Determinants of Health (SDOH) Interventions    Readmission Risk Interventions No flowsheet data found.

## 2019-11-03 ENCOUNTER — Other Ambulatory Visit: Payer: Self-pay | Admitting: Internal Medicine

## 2019-11-03 DIAGNOSIS — Z1231 Encounter for screening mammogram for malignant neoplasm of breast: Secondary | ICD-10-CM

## 2019-11-10 NOTE — Progress Notes (Signed)
[facility]  Physical Therapy Certification  Patient Details  Name: Gail Paul MRN: [id number] Date of Birth: 09-17-1933 Medical Diagnosis: Active Problems:   Diabetes [facility])   Essential hypertension   CAD (coronary artery disease)   Acute encephalopathy   Obesity (BMI 30-39.9)   Adverse drug interaction with prescription medication  Visit Diagnosis: PT Visit Diagnosis: Difficulty in walking, not elsewhere classified (R26.2) PT Problem: Decreased activity tolerance, Decreased mobility  Goals:  Eval only.  Duration: Services will be provided through the following date:    Frequency:   Eval only  Amount: one treatment session per day unless otherwise indicated.    Certification Start Date: 11/01/2019 Certification End Date:  11/01/2019   PT Treatments/Interventions:  Evaluation.  Theract.  Gait training.  Patient education.   Theodor Mustin H. Manson Passey, PT, DPT, NCS 11/10/19, 11:02 PM [phone]

## 2019-12-17 ENCOUNTER — Other Ambulatory Visit: Payer: Self-pay | Admitting: Orthopaedic Surgery

## 2019-12-17 DIAGNOSIS — S22080A Wedge compression fracture of T11-T12 vertebra, initial encounter for closed fracture: Secondary | ICD-10-CM

## 2019-12-18 ENCOUNTER — Ambulatory Visit: Payer: Medicare HMO

## 2019-12-19 ENCOUNTER — Other Ambulatory Visit: Payer: Self-pay

## 2019-12-19 ENCOUNTER — Emergency Department
Admission: EM | Admit: 2019-12-19 | Discharge: 2019-12-19 | Disposition: A | Discharge status: Home / Self Care | Payer: Medicare HMO | Attending: Emergency Medicine | Admitting: Emergency Medicine

## 2019-12-19 ENCOUNTER — Emergency Department: Payer: Medicare HMO

## 2019-12-19 DIAGNOSIS — Z7982 Long term (current) use of aspirin: Secondary | ICD-10-CM | POA: Insufficient documentation

## 2019-12-19 DIAGNOSIS — Z79899 Other long term (current) drug therapy: Secondary | ICD-10-CM | POA: Insufficient documentation

## 2019-12-19 DIAGNOSIS — M546 Pain in thoracic spine: Secondary | ICD-10-CM

## 2019-12-19 DIAGNOSIS — I110 Hypertensive heart disease with heart failure: Secondary | ICD-10-CM | POA: Insufficient documentation

## 2019-12-19 DIAGNOSIS — M4804 Spinal stenosis, thoracic region: Secondary | ICD-10-CM

## 2019-12-19 DIAGNOSIS — E119 Type 2 diabetes mellitus without complications: Secondary | ICD-10-CM | POA: Insufficient documentation

## 2019-12-19 DIAGNOSIS — I2510 Atherosclerotic heart disease of native coronary artery without angina pectoris: Secondary | ICD-10-CM | POA: Diagnosis not present

## 2019-12-19 DIAGNOSIS — I509 Heart failure, unspecified: Secondary | ICD-10-CM | POA: Insufficient documentation

## 2019-12-19 LAB — PROTIME-INR
INR: 1 (ref 0.8–1.2)
Prothrombin Time: 12.8 seconds (ref 11.4–15.2)

## 2019-12-19 LAB — COMPREHENSIVE METABOLIC PANEL
ALT: 17 U/L (ref 0–44)
AST: 21 U/L (ref 15–41)
Albumin: 3.8 g/dL (ref 3.5–5.0)
Alkaline Phosphatase: 53 U/L (ref 38–126)
Anion gap: 9 (ref 5–15)
BUN: 30 mg/dL — ABNORMAL HIGH (ref 8–23)
CO2: 25 mmol/L (ref 22–32)
Calcium: 8.5 mg/dL — ABNORMAL LOW (ref 8.9–10.3)
Chloride: 104 mmol/L (ref 98–111)
Creatinine, Ser: 1.19 mg/dL — ABNORMAL HIGH (ref 0.44–1.00)
GFR, Estimated: 42 mL/min — ABNORMAL LOW (ref >60)
Glucose, Bld: 177 mg/dL — ABNORMAL HIGH (ref 70–99)
Potassium: 4.2 mmol/L (ref 3.5–5.1)
Sodium: 138 mmol/L (ref 135–145)
Total Bilirubin: 0.7 mg/dL (ref 0.3–1.2)
Total Protein: 6.8 g/dL (ref 6.5–8.1)

## 2019-12-19 MED ORDER — HYDROCODONE-ACETAMINOPHEN 5-325 MG PO TABS: 2.0000 | ORAL_TABLET | Freq: Four times a day (QID) | ORAL | 0 refills | Status: DC | PRN

## 2019-12-19 MED ORDER — LIDOCAINE 5 % EX PTCH
1.0000 | MEDICATED_PATCH | CUTANEOUS | Status: DC
  Administered 2019-12-19: 1 via TRANSDERMAL
  Filled 2019-12-19: qty 1

## 2019-12-19 MED ORDER — METHYLPREDNISOLONE SODIUM SUCC 125 MG IJ SOLR
125.0000 mg | Freq: Once | INTRAMUSCULAR | Status: AC
  Administered 2019-12-19: 125 mg via INTRAVENOUS
  Filled 2019-12-19: qty 2

## 2019-12-19 MED ORDER — KETOROLAC TROMETHAMINE 30 MG/ML IJ SOLN
15.0000 mg | Freq: Once | INTRAMUSCULAR | Status: AC
  Administered 2019-12-19: 15 mg via INTRAVENOUS
  Filled 2019-12-19: qty 1

## 2019-12-19 MED ORDER — METHYLPREDNISOLONE 4 MG PO TBPK: ORAL_TABLET | ORAL | 0 refills | Status: DC

## 2019-12-19 MED ORDER — LORAZEPAM 2 MG/ML IJ SOLN
0.5000 mg | Freq: Once | INTRAMUSCULAR | Status: AC
  Administered 2019-12-19: 0.5 mg via INTRAVENOUS
  Filled 2019-12-19: qty 1

## 2019-12-19 MED ORDER — MORPHINE SULFATE (PF) 2 MG/ML IV SOLN
2.0000 mg | INTRAVENOUS | Status: DC | PRN
  Administered 2019-12-19 (×2): 2 mg via INTRAVENOUS
  Filled 2019-12-19 (×2): qty 1

## 2019-12-19 NOTE — ED Notes (Signed)
Assumed care of pt at 1900, pt reporting cramping in legs upon assumption of care and medicated per MAR. Pt able to ambulate to restroom at this time, independently with steady gait. AO x4. Talking in full sentences with regular and unlabored breathing

## 2019-12-19 NOTE — ED Triage Notes (Signed)
Pt presents via EMS c/o mid lumbar back pain. Reports pain since August but much worse over last 2 days. Very tearful upon exam. Reports hx of accidental OD on pain meds so very cautious. Also reports bilateral LE swelling.

## 2019-12-19 NOTE — ED Provider Notes (Signed)
[facility] Emergency Department Provider Note   ____________________________________________   First MD Initiated Contact with Patient 12/19/19 1639     (approximate)  I have reviewed the triage vital signs and the nursing notes.   HISTORY  Chief Complaint Back Pain    HPI Gail Paul is a 86 y.o. female with a past medical history of CHF and chronic thoracic back pain who presents via EMS for worsening of her chronic thoracic back pain.  Patient states that she was hospitalized approximately 1 week prior to arrival after she started Flexeril and Norco and became acutely encephalopathic.  Patient was admitted to the hospital at that time and she has been afraid to take any of her home pain medications since this event.  Patient did state that she took Tylenol and diclofenac prior to arrival with minimal relief in her pain.  Patient describes 10/10 lower thoracic back pain that does not radiate and is worsened with any movement         Past Medical History:  Diagnosis Date  . CHF (congestive heart failure) (HCC)   . Heart attack (HCC)   . Hypertension     Patient Active Problem List   Diagnosis Date Noted  . Obesity (BMI 30-39.9) 11/01/2019  . Adverse drug interaction with prescription medication 11/01/2019  . Acute encephalopathy 10/31/2019  . Diabetes (HCC) 12/16/2018  . Essential hypertension 12/16/2018  . CAD (coronary artery disease) 12/16/2018  . Full thickness rotator cuff tear 12/16/2018  . Radial styloid tenosynovitis 12/16/2018  . Shoulder pain 12/16/2018  . Sprain of rotator cuff capsule 12/16/2018  . Stiffness of shoulder joint 12/16/2018  . PAD (peripheral artery disease) (HCC) 12/16/2018    Past Surgical History:  Procedure Laterality Date  . BACK SURGERY    . carpel tunnel surgery    . CATARACT EXTRACTION      Prior to Admission medications   Medication Sig Start Date End Date Taking? Authorizing Provider  amLODipine  (NORVASC) 5 MG tablet Take 5 mg by mouth daily. 10/26/19   [provider]  aspirin 81 MG tablet Take 81 mg by mouth daily.    [provider]  atorvastatin (LIPITOR) 80 MG tablet Take 80 mg by mouth daily.    [provider]  busPIRone (BUSPAR) 10 MG tablet Take 10 mg by mouth 2 (two) times daily.     [provider]  cetirizine (ZYRTEC) 10 MG tablet Take 10 mg by mouth daily.    [provider]  cholecalciferol (VITAMIN D) 1000 UNITS tablet Take 1,000 Units by mouth daily.    [provider]  clopidogrel (PLAVIX) 75 MG tablet Take 75 mg by mouth daily.    [provider]  co-enzyme Q-10 50 MG capsule Take 100 mg by mouth.     [provider]  cyclobenzaprine (FLEXERIL) 10 MG tablet Take 10 mg by mouth 3 (three) times daily as needed. 10/15/19   [provider]  DULoxetine (CYMBALTA) 30 MG capsule Take 30 mg by mouth daily.  09/07/18   [provider]  enalapril (VASOTEC) 5 MG tablet Take 5 mg by mouth daily. 12/01/18   [provider]  furosemide (LASIX) 20 MG tablet Take 20 mg by mouth 3 (three) times a week. 10/22/19   [provider]  gabapentin (NEURONTIN) 100 MG capsule Take 100 mg by mouth 2 (two) times daily. 10/29/19   [provider]  HYDROcodone-acetaminophen (NORCO) 5-325 MG tablet Take 2 tablets  by mouth every 6 (six) hours as needed for moderate pain. 12/19/19   Merwyn Katos, MD  methylPREDNISolone (MEDROL DOSEPAK) 4 MG TBPK tablet Please take as directed on package 12/19/19   Merwyn Katos, MD  metoprolol succinate (TOPROL-XL) 25 MG 24 hr tablet Take 25 mg by mouth daily. 09/18/19   [provider]  metoprolol tartrate (LOPRESSOR) 25 MG tablet Take 25 mg by mouth daily.     [provider]  nitroGLYCERIN (NITROSTAT) 0.4 MG SL tablet Place 0.4 mg under the tongue as directed. 08/25/19   [provider]  Omega-3 Fatty Acids (FISH OIL) 1000 MG CAPS  Take 1,000 mg by mouth daily.    [provider]  pantoprazole (PROTONIX) 40 MG tablet Take 40 mg by mouth daily. 08/25/19   [provider]  TURMERIC PO Take 1,950 mg by mouth.    [provider]    Allergies Shellfish allergy  Family History  Problem Relation Age of Onset  . Deep vein thrombosis Mother   . Stroke Mother   . Hypertension Father     Social History Social History   Tobacco Use  . Smoking status: Never Smoker  . Smokeless tobacco: Never Used  Substance Use Topics  . Alcohol use: No  . Drug use: No    Review of Systems Constitutional: No fever/chills Eyes: No visual changes. ENT: No sore throat. Cardiovascular: Denies chest pain. Respiratory: Denies shortness of breath. Gastrointestinal: No abdominal pain.  No nausea, no vomiting.  No diarrhea. Genitourinary: Negative for dysuria. Musculoskeletal: Negative for acute arthralgias Skin: Negative for rash. Neurological: Negative for headaches, weakness/numbness/paresthesias in any extremity.  Patient denies any bowel/bladder incontinence or saddle anesthesia Psychiatric: Negative for suicidal ideation/homicidal ideation   ____________________________________________   PHYSICAL EXAM:  VITAL SIGNS: ED Triage Vitals [12/19/19 1642]  Enc Vitals Group     BP (!) 190/81     Pulse Rate 84     Resp (!) 24     Temp 98.5 F (36.9 C)     Temp Source Oral     SpO2 99 %     Weight      Height      Head Circumference      Peak Flow      Pain Score 10     Pain Loc      Pain Edu?      Excl. in GC?    Constitutional: Alert and oriented. Well appearing and in no acute distress. Eyes: Conjunctivae are normal. PERRL. Head: Atraumatic. Nose: No congestion/rhinnorhea. Mouth/Throat: Mucous membranes are moist. Neck: No stridor Cardiovascular: Grossly normal heart sounds.  Good peripheral circulation. Respiratory: Normal respiratory effort.  No retractions. Gastrointestinal: Soft and  nontender. No distention. Musculoskeletal: No obvious deformities.  Pain with any movement Neurologic:  Normal speech and language. No gross focal neurologic deficits are appreciated.  Tenderness to palpation of her lower midline thoracic spine Skin:  Skin is warm and dry. No rash noted. Psychiatric: Mood and affect are normal. Speech and behavior are normal.  ____________________________________________   LABS (all labs ordered are listed, but only abnormal results are displayed)  Labs Reviewed  COMPREHENSIVE METABOLIC PANEL - Abnormal; Notable for the following components:      Result Value   Glucose, Bld 177 (*)    BUN 30 (*)    Creatinine, Ser 1.19 (*)    Calcium 8.5 (*)    GFR, Estimated 42 (*)    All other components within normal  limits  PROTIME-INR   ____________________________________________  EKG  ED ECG REPORT I, Merwyn Katos, the attending physician, personally viewed and interpreted this ECG.  Date: 12/19/2019 EKG Time: 1637 Rate: 84 Rhythm: normal sinus rhythm QRS Axis: normal Intervals: normal ST/T Wave abnormalities: normal Narrative Interpretation: no evidence of acute ischemia  ____________________________________________  RADIOLOGY  ED MD interpretation: MRI of the thoracic spine shows no evidence of compression fracture but advanced lower thoracic disc and facet degeneration with severe spinal stenosis at T12-L1  Official radiology report(s): MR THORACIC SPINE WO CONTRAST  Result Date: 12/19/2019 CLINICAL DATA:  Mid back pain.  Compression fracture suspected. EXAM: MRI THORACIC SPINE WITHOUT CONTRAST TECHNIQUE: Multiplanar, multisequence MR imaging of the thoracic spine was performed. No intravenous contrast was administered. COMPARISON:  Chest CT 04/19/2015 FINDINGS: Alignment: Minimal anterolisthesis of T1 on T2 and minimal retrolisthesis of T10 on T11, T11 on T12, T12 on L1, and L1 on L2. Vertebrae: No fracture or suspicious osseous lesion.  Advanced disc degeneration from T10-11 to L1-2 with associated degenerative endplate changes including up to mild degenerative edema. Partially visualized posterior lumbar spinal fusion beginning at the L2 level. Cord:  Normal signal. Paraspinal and other soft tissues: 2.5 cm right adrenal nodule, unchanged from the 2017 CT where it was consistent with an adenoma. Disc levels: Disc bulging and facet hypertrophy result in mild spinal stenosis at T9-10, T10-11, and T11-12. Disc bulging, a broad posterior calcified disc extrusion, prominent dorsal epidural fat, and facet hypertrophy at T12-L1 result in moderate to severe spinal stenosis. Diffuse thoracic facet arthrosis contributes to widespread moderate to severe neural foraminal stenosis which appears worst at L1-2. IMPRESSION: 1. No compression fracture. 2. Advanced lower thoracic disc and facet degeneration with moderate to severe spinal stenosis at T12-L1 and mild spinal stenosis at T9-10, T10-11, and T11-12. 3. Widespread moderate to severe neural foraminal stenosis. Electronically Signed   By: Sebastian Ache M.D.   On: 12/19/2019 19:17    ____________________________________________   PROCEDURES  Procedure(s) performed (including Critical Care):  Procedures   ____________________________________________   INITIAL IMPRESSION / ASSESSMENT AND PLAN / ED COURSE  As part of my medical decision making, I reviewed the following data within the electronic MEDICAL RECORD NUMBER Nursing notes reviewed and incorporated, Labs reviewed, EKG interpreted, Old chart reviewed, Radiograph reviewed and Notes from prior ED visits reviewed and incorporated        Patient presents for lower thoracic back pain. Given History and Exam the patient appears to be at low risk for Spinal Cord Compression Syndrome, Vertebral Malignancy/Mets, acute Spinal Fracture, Vertebral Osteomyelitis, Epidural Abscess, Infected or Obstructing Kidney Stone.  Their presentation appears  most likely to be secondary to non-emergent musculoskeletal etiology vs non-emergent disc herniation.  ED Workup: MRI of the lumbar spine reads as above.  Disposition: Discharge. Strict return precautions discussed with patient with full understanding. Advised patient to follow up promptly with primary care provider as well as referral to orthopedic surgery      ____________________________________________   FINAL CLINICAL IMPRESSION(S) / ED DIAGNOSES  Final diagnoses:  Acute midline thoracic back pain  Thoracic spinal stenosis     ED Discharge Orders         Ordered    HYDROcodone-acetaminophen (NORCO) 5-325 MG tablet  Every 6 hours PRN        12/19/19 2315    methylPREDNISolone (MEDROL DOSEPAK) 4 MG TBPK tablet        12/19/19 2315  Note:  This document was prepared using Dragon voice recognition software and may include unintentional dictation errors.   Merwyn KatosBradler, Brylan Seubert K, MD 12/19/19 [phone]

## 2020-06-07 NOTE — Progress Notes (Signed)
Office Visit Note  Patient: Gail Paul             Date of Birth: 1933/03/22           MRN: [id number]             PCP: Margaretann Loveless, MD Referring: Margaretann Loveless, MD Visit Date: 06/20/2020 Occupation: @GUAROCC @  Subjective:  Pain in both hands.   History of Present Illness: Gail Paul is a 87 y.o. female seen in consultation per request of her PCP.  According to the patient her she has had longstanding history of disc disease of upper and lower back.  She states she had multilevel fusion 12 years ago at the [facility] on her lower back.  She states she did some activity last year which caused increased lower back pain.  At the time she was prescribed some medications by her PCP.  She states she was found unconscious and was taken to the hospital.  She was in the hospital for 3 days on a hard bed which caused increased pain and discomfort in her lower back.  She states she came home when she was almost paralyzed.  She was taken back to the hospital and had MRI of the thoracic spine.  It took her about 6 months to recover completely from her upper and lower back pain.  She was also having a lot of lower extremity muscle cramps for which she has been taking mustard.  She states she was on narcotics but she cannot get narcotics anymore.  She has been using CBD oil which has been very helpful.  She has been under care of [facility]. [facility] for many years.  He has done surgery on her left shoulder and her left thumb for tendinitis.  He was going to operate on her right shoulder and her right hand for tendinitis but could not do due to COVID-19 pandemic.  According to the patient the window fell on her right wrist in December 2021 and since then she has been having pain in her right wrist.  She continues to have some discomfort in her hands.  Is been going to physical therapy.  Activities of Daily Living:  Patient reports morning stiffness for all day. Patient Reports nocturnal pain.   Difficulty  dressing/grooming: Reports Difficulty climbing stairs: Reports Difficulty getting out of chair: Reports Difficulty using hands for taps, buttons, cutlery, and/or writing: Reports  Review of Systems  Constitutional: Positive for fatigue. Negative for night sweats, weight gain and weight loss.  HENT: Negative for mouth sores, trouble swallowing, trouble swallowing, mouth dryness and nose dryness.   Eyes: Positive for itching. Negative for pain, redness, visual disturbance and dryness.  Respiratory: Negative for cough, shortness of breath and difficulty breathing.   Cardiovascular: Negative for chest pain, palpitations, hypertension, irregular heartbeat and swelling in legs/feet.  Gastrointestinal: Negative for blood in stool, constipation and diarrhea.  Endocrine: Negative for increased urination.  Genitourinary: Negative for difficulty urinating and vaginal dryness.  Musculoskeletal: Positive for arthralgias, joint pain, joint swelling, morning stiffness and muscle tenderness. Negative for myalgias, muscle weakness and myalgias.  Skin: Negative for color change, rash, hair loss, redness, skin tightness, ulcers and sensitivity to sunlight.  Allergic/Immunologic: Negative for susceptible to infections.  Neurological: Negative for dizziness, numbness, headaches, paresthesias, memory loss, night sweats and weakness.  Hematological: Positive for bruising/bleeding tendency. Negative for swollen glands.  Psychiatric/Behavioral: Positive for sleep disturbance. Negative for depressed mood and confusion. The patient is  nervous/anxious.     PMFS History:  Patient Active Problem List   Diagnosis Date Noted  . Obesity (BMI 30-39.9) 11/01/2019  . Adverse drug interaction with prescription medication 11/01/2019  . Acute encephalopathy 10/31/2019  . Diabetes (HCC) 12/16/2018  . Essential hypertension 12/16/2018  . CAD (coronary artery disease) 12/16/2018  . Full thickness rotator cuff tear 12/16/2018   . Radial styloid tenosynovitis 12/16/2018  . Shoulder pain 12/16/2018  . Sprain of rotator cuff capsule 12/16/2018  . Stiffness of shoulder joint 12/16/2018  . PAD (peripheral artery disease) (HCC) 12/16/2018    Past Medical History:  Diagnosis Date  . CHF (congestive heart failure) (HCC)   . Heart attack (HCC)   . Hypertension     Family History  Problem Relation Age of Onset  . Deep vein thrombosis Mother   . Stroke Mother   . Hypertension Father   . Emphysema Father   . Healthy Brother    Past Surgical History:  Procedure Laterality Date  . BACK SURGERY    . carpel tunnel surgery    . CATARACT EXTRACTION    . ROTATOR CUFF REPAIR Left    Social History   Social History Narrative  . Not on file   Immunization History  Administered Date(s) Administered  . PFIZER(Purple Top)SARS-COV-2 Vaccination 03/31/2019, 04/21/2019, 01/15/2020     Objective: Vital Signs: BP 136/81 (BP Location: Right Arm, Patient Position: Sitting, Cuff Size: Normal)   Pulse 65   Resp 16   Ht 5' 0.5" (1.537 m)   Wt 171 lb 9.6 oz (77.8 kg)   BMI 32.96 kg/m    Physical Exam Vitals and nursing note reviewed.  Constitutional:      Appearance: She is well-developed.  HENT:     Head: Normocephalic and atraumatic.  Eyes:     Conjunctiva/sclera: Conjunctivae normal.  Cardiovascular:     Rate and Rhythm: Normal rate and regular rhythm.     Heart sounds: Normal heart sounds.  Pulmonary:     Effort: Pulmonary effort is normal.     Breath sounds: Normal breath sounds.  Abdominal:     General: Bowel sounds are normal.     Palpations: Abdomen is soft.  Musculoskeletal:     Cervical back: Normal range of motion.  Lymphadenopathy:     Cervical: No cervical adenopathy.  Skin:    General: Skin is warm and dry.     Capillary Refill: Capillary refill takes less than 2 seconds.  Neurological:     Mental Status: She is alert and oriented to person, place, and time.  Psychiatric:         Behavior: Behavior normal.      Musculoskeletal Exam: She had limited range of motion of the cervical thoracic and lumbar spine.  She had no tenderness over thoracic or lumbar spine.  No SI joint tenderness was noted.  Right shoulder joint abduction was limited to 90 degrees.  Left shoulder joint was in full range of motion.  Bilateral hip joints with good range of motion.  She had tenderness on palpation over right wrist joint.  She has a contracture in her right third PIP joint.  Hip joints and knee joints with good range of motion.  She had no tenderness over ankles or MTPs.  CDAI Exam: CDAI Score: -- Patient Global: --; Provider Global: -- Swollen: --; Tender: -- Joint Exam 06/20/2020   No joint exam has been documented for this visit   There is currently no information documented on  the homunculus. Go to the Rheumatology activity and complete the homunculus joint exam.  Investigation: No additional findings.  Imaging: No results found.  Recent Labs: Lab Results  Component Value Date   WBC 6.2 10/31/2019   HGB 12.9 10/31/2019   PLT 274 10/31/2019   NA 138 12/19/2019   K 4.2 12/19/2019   CL 104 12/19/2019   CO2 25 12/19/2019   GLUCOSE 177 (H) 12/19/2019   BUN 30 (H) 12/19/2019   CREATININE 1.19 (H) 12/19/2019   BILITOT 0.7 12/19/2019   ALKPHOS 53 12/19/2019   AST 21 12/19/2019   ALT 17 12/19/2019   PROT 6.8 12/19/2019   ALBUMIN 3.8 12/19/2019   CALCIUM 8.5 (L) 12/19/2019   GFRAA 44 (L) 10/31/2019    Speciality Comments: No specialty comments available.    IMPRESSION: 1. No compression fracture. 2. Advanced lower thoracic disc and facet degeneration with moderate to severe spinal stenosis at T12-L1 and mild spinal stenosis at T9-10, T10-11, and T11-12. 3. Widespread moderate to severe neural foraminal stenosis.   Electronically Signed   By: Sebastian Ache M.D.   On: 12/19/2019 19:17  Procedures:  No procedures performed Allergies: Shellfish allergy    April 05, 2020 CBC normal, CMP showed GFR 30, AST ALT normal, TSH 5.030 elevated, vitamin D 21.9.  Assessment / Plan:     Visit Diagnoses: Pain in right hand -patient been experiencing pain and discomfort in her right wrist joint and right hand.  She states a window fell on her right wrist joint in December 2021.  Since then she has been having pain and swelling in her right wrist.  She has been wearing a brace.  She would like to see a hand specialist.  She also has some stiffness in her hands.  The clinical and radiographic findings are consistent with osteoarthritis.  Narrowing between the radius and the scaphoid bone spurs noted.  Some cystic changes were noted in the scaphoid bone.  Patient gives history of trauma and most likely it is posttraumatic change.  I would like to see Dr. Carollee Massed opinion.  Plan: XR Hand 2 View Right.  I will refer her to Dr. Janee Morn at [facility] orthopedics.  Pain in left hand -she has some stiffness in her left hand and requested x-rays.  Plan: XR Hand 2 View Left.  X-ray findings were discussed with the patient which were consistent with osteoarthritis.  Detailed counseling regarding osteoarthritis was provided.  She may benefit from physical therapy.  She can get physical therapy with Dr. Carollee Massed office.  Arthritis of left acromioclavicular joint - Moderate AC joint arthopathy on XR from 04/04/20.  Patient has been followed by Dr. Jerl Santos.  DDD (degenerative disc disease), thoracic - With a spinal stenosis.  She states she has no discomfort in her thoracic spine.  DDD (degenerative disc disease), lumbar - s/p fusion 12 years ago at [facility] per patient.  She had no point tenderness and no right SI joint tenderness.  Other medical problems are listed as follows:  Type 2 diabetes mellitus with other specified complication, without long-term current use of insulin (HCC)  Coronary artery disease involving native coronary artery of native heart,  unspecified whether angina present  Essential hypertension  PAD (peripheral artery disease) (HCC)  History of CHF (congestive heart failure)  History of MI (myocardial infarction)  History of hyperlipidemia  Acute encephalopathy-patient states she was hospitalized last year.  She believes it was due to medication   Orders: Orders Placed This Encounter  Procedures  . XR Hand 2 View Right  . XR Hand 2 View Left   No orders of the defined types were placed in this encounter.  Face-to-face time spent patient was over 50 minutes..  50% time was spent in counseling and coordination of care  Follow-Up Instructions: Return if symptoms worsen or fail to improve.   Pollyann Savoy, MD  Note - This record has been created using Animal nutritionist.  Chart creation errors have been sought, but may not always  have been located. Such creation errors do not reflect on  the standard of medical care.

## 2020-06-20 ENCOUNTER — Other Ambulatory Visit: Payer: Self-pay

## 2020-06-20 ENCOUNTER — Ambulatory Visit: Payer: Self-pay

## 2020-06-20 ENCOUNTER — Ambulatory Visit: Payer: Medicare HMO | Admitting: Rheumatology

## 2020-06-20 ENCOUNTER — Encounter: Payer: Self-pay | Admitting: Rheumatology

## 2020-06-20 VITALS — BP 136/81 | HR 65 | Resp 16 | Ht 60.5 in | Wt 171.6 lb

## 2020-06-20 DIAGNOSIS — M79642 Pain in left hand: Secondary | ICD-10-CM

## 2020-06-20 DIAGNOSIS — Z8639 Personal history of other endocrine, nutritional and metabolic disease: Secondary | ICD-10-CM

## 2020-06-20 DIAGNOSIS — M79641 Pain in right hand: Secondary | ICD-10-CM | POA: Diagnosis not present

## 2020-06-20 DIAGNOSIS — I252 Old myocardial infarction: Secondary | ICD-10-CM

## 2020-06-20 DIAGNOSIS — M5134 Other intervertebral disc degeneration, thoracic region: Secondary | ICD-10-CM | POA: Diagnosis not present

## 2020-06-20 DIAGNOSIS — I2510 Atherosclerotic heart disease of native coronary artery without angina pectoris: Secondary | ICD-10-CM

## 2020-06-20 DIAGNOSIS — E1169 Type 2 diabetes mellitus with other specified complication: Secondary | ICD-10-CM

## 2020-06-20 DIAGNOSIS — M19012 Primary osteoarthritis, left shoulder: Secondary | ICD-10-CM

## 2020-06-20 DIAGNOSIS — Z8739 Personal history of other diseases of the musculoskeletal system and connective tissue: Secondary | ICD-10-CM

## 2020-06-20 DIAGNOSIS — G9340 Encephalopathy, unspecified: Secondary | ICD-10-CM

## 2020-06-20 DIAGNOSIS — M5136 Other intervertebral disc degeneration, lumbar region: Secondary | ICD-10-CM

## 2020-06-20 DIAGNOSIS — M654 Radial styloid tenosynovitis [de Quervain]: Secondary | ICD-10-CM

## 2020-06-20 DIAGNOSIS — I739 Peripheral vascular disease, unspecified: Secondary | ICD-10-CM

## 2020-06-20 DIAGNOSIS — Z8679 Personal history of other diseases of the circulatory system: Secondary | ICD-10-CM

## 2020-06-20 DIAGNOSIS — I10 Essential (primary) hypertension: Secondary | ICD-10-CM

## 2020-07-19 ENCOUNTER — Ambulatory Visit: Payer: Medicare HMO | Admitting: Rheumatology

## 2020-08-04 ENCOUNTER — Other Ambulatory Visit: Payer: Self-pay | Admitting: Internal Medicine

## 2020-08-04 DIAGNOSIS — R0602 Shortness of breath: Secondary | ICD-10-CM | POA: Diagnosis not present

## 2020-08-04 DIAGNOSIS — M2550 Pain in unspecified joint: Secondary | ICD-10-CM | POA: Diagnosis not present

## 2020-08-04 DIAGNOSIS — E039 Hypothyroidism, unspecified: Secondary | ICD-10-CM | POA: Diagnosis not present

## 2020-08-04 DIAGNOSIS — I10 Essential (primary) hypertension: Secondary | ICD-10-CM | POA: Diagnosis not present

## 2020-08-04 DIAGNOSIS — I2510 Atherosclerotic heart disease of native coronary artery without angina pectoris: Secondary | ICD-10-CM | POA: Diagnosis not present

## 2020-08-04 DIAGNOSIS — Z1231 Encounter for screening mammogram for malignant neoplasm of breast: Secondary | ICD-10-CM

## 2020-08-04 DIAGNOSIS — E538 Deficiency of other specified B group vitamins: Secondary | ICD-10-CM | POA: Diagnosis not present

## 2020-08-04 DIAGNOSIS — E785 Hyperlipidemia, unspecified: Secondary | ICD-10-CM | POA: Diagnosis not present

## 2020-08-04 DIAGNOSIS — E559 Vitamin D deficiency, unspecified: Secondary | ICD-10-CM | POA: Diagnosis not present

## 2020-08-04 DIAGNOSIS — R7302 Impaired glucose tolerance (oral): Secondary | ICD-10-CM | POA: Diagnosis not present

## 2020-08-04 DIAGNOSIS — I340 Nonrheumatic mitral (valve) insufficiency: Secondary | ICD-10-CM | POA: Diagnosis not present

## 2020-08-05 DIAGNOSIS — M6281 Muscle weakness (generalized): Secondary | ICD-10-CM | POA: Diagnosis not present

## 2020-08-05 DIAGNOSIS — M5459 Other low back pain: Secondary | ICD-10-CM | POA: Diagnosis not present

## 2020-08-05 DIAGNOSIS — R262 Difficulty in walking, not elsewhere classified: Secondary | ICD-10-CM | POA: Diagnosis not present

## 2020-08-11 DIAGNOSIS — R262 Difficulty in walking, not elsewhere classified: Secondary | ICD-10-CM | POA: Diagnosis not present

## 2020-08-11 DIAGNOSIS — M6281 Muscle weakness (generalized): Secondary | ICD-10-CM | POA: Diagnosis not present

## 2020-08-11 DIAGNOSIS — M5459 Other low back pain: Secondary | ICD-10-CM | POA: Diagnosis not present

## 2020-08-16 ENCOUNTER — Ambulatory Visit
Admission: RE | Admit: 2020-08-16 | Discharge: 2020-08-16 | Disposition: A | Discharge status: Home / Self Care | Payer: Medicare HMO | Source: Ambulatory Visit | Attending: Internal Medicine | Admitting: Internal Medicine

## 2020-08-16 ENCOUNTER — Other Ambulatory Visit: Payer: Self-pay

## 2020-08-16 DIAGNOSIS — Z1231 Encounter for screening mammogram for malignant neoplasm of breast: Secondary | ICD-10-CM | POA: Insufficient documentation

## 2020-08-26 DIAGNOSIS — M19131 Post-traumatic osteoarthritis, right wrist: Secondary | ICD-10-CM | POA: Diagnosis not present

## 2020-08-26 DIAGNOSIS — M654 Radial styloid tenosynovitis [de Quervain]: Secondary | ICD-10-CM | POA: Diagnosis not present

## 2020-08-26 DIAGNOSIS — G8918 Other acute postprocedural pain: Secondary | ICD-10-CM | POA: Diagnosis not present

## 2020-08-26 DIAGNOSIS — M19031 Primary osteoarthritis, right wrist: Secondary | ICD-10-CM | POA: Diagnosis not present

## 2020-08-26 DIAGNOSIS — M25531 Pain in right wrist: Secondary | ICD-10-CM | POA: Diagnosis not present

## 2020-09-15 DIAGNOSIS — M654 Radial styloid tenosynovitis [de Quervain]: Secondary | ICD-10-CM | POA: Diagnosis not present

## 2020-09-15 DIAGNOSIS — M25531 Pain in right wrist: Secondary | ICD-10-CM | POA: Diagnosis not present

## 2020-09-15 DIAGNOSIS — M19131 Post-traumatic osteoarthritis, right wrist: Secondary | ICD-10-CM | POA: Diagnosis not present

## 2020-09-30 DIAGNOSIS — M546 Pain in thoracic spine: Secondary | ICD-10-CM | POA: Diagnosis not present

## 2020-09-30 DIAGNOSIS — M25552 Pain in left hip: Secondary | ICD-10-CM | POA: Diagnosis not present

## 2020-09-30 DIAGNOSIS — M62830 Muscle spasm of back: Secondary | ICD-10-CM | POA: Diagnosis not present

## 2020-10-17 DIAGNOSIS — M546 Pain in thoracic spine: Secondary | ICD-10-CM | POA: Diagnosis not present

## 2020-10-17 DIAGNOSIS — M62830 Muscle spasm of back: Secondary | ICD-10-CM | POA: Diagnosis not present

## 2020-10-17 DIAGNOSIS — M25552 Pain in left hip: Secondary | ICD-10-CM | POA: Diagnosis not present

## 2020-10-27 DIAGNOSIS — M25552 Pain in left hip: Secondary | ICD-10-CM | POA: Diagnosis not present

## 2020-10-27 DIAGNOSIS — M546 Pain in thoracic spine: Secondary | ICD-10-CM | POA: Diagnosis not present

## 2020-10-27 DIAGNOSIS — M62830 Muscle spasm of back: Secondary | ICD-10-CM | POA: Diagnosis not present

## 2020-10-31 DIAGNOSIS — I10 Essential (primary) hypertension: Secondary | ICD-10-CM | POA: Diagnosis not present

## 2020-10-31 DIAGNOSIS — E785 Hyperlipidemia, unspecified: Secondary | ICD-10-CM | POA: Diagnosis not present

## 2020-10-31 DIAGNOSIS — I2510 Atherosclerotic heart disease of native coronary artery without angina pectoris: Secondary | ICD-10-CM | POA: Diagnosis not present

## 2020-10-31 DIAGNOSIS — I340 Nonrheumatic mitral (valve) insufficiency: Secondary | ICD-10-CM | POA: Diagnosis not present

## 2020-11-10 DIAGNOSIS — M25531 Pain in right wrist: Secondary | ICD-10-CM | POA: Diagnosis not present

## 2020-11-16 DIAGNOSIS — M25531 Pain in right wrist: Secondary | ICD-10-CM | POA: Diagnosis not present

## 2020-11-30 DIAGNOSIS — M25531 Pain in right wrist: Secondary | ICD-10-CM | POA: Diagnosis not present

## 2020-12-07 DIAGNOSIS — M25531 Pain in right wrist: Secondary | ICD-10-CM | POA: Diagnosis not present

## 2020-12-14 DIAGNOSIS — M25531 Pain in right wrist: Secondary | ICD-10-CM | POA: Diagnosis not present

## 2021-01-06 DIAGNOSIS — Z23 Encounter for immunization: Secondary | ICD-10-CM | POA: Diagnosis not present

## 2021-01-06 DIAGNOSIS — E119 Type 2 diabetes mellitus without complications: Secondary | ICD-10-CM | POA: Diagnosis not present

## 2021-01-06 DIAGNOSIS — M5489 Other dorsalgia: Secondary | ICD-10-CM | POA: Diagnosis not present

## 2021-01-06 DIAGNOSIS — I2510 Atherosclerotic heart disease of native coronary artery without angina pectoris: Secondary | ICD-10-CM | POA: Diagnosis not present

## 2021-01-06 DIAGNOSIS — E785 Hyperlipidemia, unspecified: Secondary | ICD-10-CM | POA: Diagnosis not present

## 2021-01-06 DIAGNOSIS — I10 Essential (primary) hypertension: Secondary | ICD-10-CM | POA: Diagnosis not present

## 2021-01-11 DIAGNOSIS — M549 Dorsalgia, unspecified: Secondary | ICD-10-CM | POA: Diagnosis not present

## 2021-01-11 DIAGNOSIS — M546 Pain in thoracic spine: Secondary | ICD-10-CM | POA: Diagnosis not present

## 2021-01-13 DIAGNOSIS — M546 Pain in thoracic spine: Secondary | ICD-10-CM | POA: Diagnosis not present

## 2021-01-13 DIAGNOSIS — M549 Dorsalgia, unspecified: Secondary | ICD-10-CM | POA: Diagnosis not present

## 2021-01-16 DIAGNOSIS — M546 Pain in thoracic spine: Secondary | ICD-10-CM | POA: Diagnosis not present

## 2021-01-16 DIAGNOSIS — M549 Dorsalgia, unspecified: Secondary | ICD-10-CM | POA: Diagnosis not present

## 2021-01-20 DIAGNOSIS — I509 Heart failure, unspecified: Secondary | ICD-10-CM | POA: Diagnosis not present

## 2021-01-20 DIAGNOSIS — I2510 Atherosclerotic heart disease of native coronary artery without angina pectoris: Secondary | ICD-10-CM | POA: Diagnosis not present

## 2021-01-20 DIAGNOSIS — E785 Hyperlipidemia, unspecified: Secondary | ICD-10-CM | POA: Diagnosis not present

## 2021-01-20 DIAGNOSIS — M546 Pain in thoracic spine: Secondary | ICD-10-CM | POA: Diagnosis not present

## 2021-01-20 DIAGNOSIS — E1121 Type 2 diabetes mellitus with diabetic nephropathy: Secondary | ICD-10-CM | POA: Diagnosis not present

## 2021-01-20 DIAGNOSIS — I10 Essential (primary) hypertension: Secondary | ICD-10-CM | POA: Diagnosis not present

## 2021-01-20 DIAGNOSIS — M549 Dorsalgia, unspecified: Secondary | ICD-10-CM | POA: Diagnosis not present

## 2021-02-16 DIAGNOSIS — I340 Nonrheumatic mitral (valve) insufficiency: Secondary | ICD-10-CM | POA: Diagnosis not present

## 2021-02-16 DIAGNOSIS — I10 Essential (primary) hypertension: Secondary | ICD-10-CM | POA: Diagnosis not present

## 2021-02-16 DIAGNOSIS — E782 Mixed hyperlipidemia: Secondary | ICD-10-CM | POA: Diagnosis not present

## 2021-02-16 DIAGNOSIS — E669 Obesity, unspecified: Secondary | ICD-10-CM | POA: Diagnosis not present

## 2021-02-16 DIAGNOSIS — I2510 Atherosclerotic heart disease of native coronary artery without angina pectoris: Secondary | ICD-10-CM | POA: Diagnosis not present

## 2021-02-16 DIAGNOSIS — R0602 Shortness of breath: Secondary | ICD-10-CM | POA: Diagnosis not present

## 2021-04-05 DIAGNOSIS — M546 Pain in thoracic spine: Secondary | ICD-10-CM | POA: Diagnosis not present

## 2021-04-10 DIAGNOSIS — M546 Pain in thoracic spine: Secondary | ICD-10-CM | POA: Diagnosis not present

## 2021-04-17 DIAGNOSIS — M546 Pain in thoracic spine: Secondary | ICD-10-CM | POA: Diagnosis not present

## 2021-04-24 DIAGNOSIS — M546 Pain in thoracic spine: Secondary | ICD-10-CM | POA: Diagnosis not present

## 2021-05-01 DIAGNOSIS — M546 Pain in thoracic spine: Secondary | ICD-10-CM | POA: Diagnosis not present

## 2021-05-10 ENCOUNTER — Other Ambulatory Visit: Payer: Self-pay | Admitting: Nurse Practitioner

## 2021-05-10 DIAGNOSIS — M546 Pain in thoracic spine: Secondary | ICD-10-CM

## 2021-05-11 DIAGNOSIS — I2510 Atherosclerotic heart disease of native coronary artery without angina pectoris: Secondary | ICD-10-CM | POA: Diagnosis not present

## 2021-05-11 DIAGNOSIS — I10 Essential (primary) hypertension: Secondary | ICD-10-CM | POA: Diagnosis not present

## 2021-05-11 DIAGNOSIS — R7302 Impaired glucose tolerance (oral): Secondary | ICD-10-CM | POA: Diagnosis not present

## 2021-05-11 DIAGNOSIS — E785 Hyperlipidemia, unspecified: Secondary | ICD-10-CM | POA: Diagnosis not present

## 2021-05-12 DIAGNOSIS — M546 Pain in thoracic spine: Secondary | ICD-10-CM | POA: Diagnosis not present

## 2021-05-15 ENCOUNTER — Other Ambulatory Visit: Payer: Self-pay

## 2021-05-15 ENCOUNTER — Ambulatory Visit
Admission: RE | Admit: 2021-05-15 | Discharge: 2021-05-15 | Disposition: A | Discharge status: Home / Self Care | Payer: Medicare HMO | Source: Ambulatory Visit | Attending: Nurse Practitioner | Admitting: Nurse Practitioner

## 2021-05-15 DIAGNOSIS — M4314 Spondylolisthesis, thoracic region: Secondary | ICD-10-CM | POA: Diagnosis not present

## 2021-05-15 DIAGNOSIS — M546 Pain in thoracic spine: Secondary | ICD-10-CM | POA: Diagnosis not present

## 2021-05-15 DIAGNOSIS — M4804 Spinal stenosis, thoracic region: Secondary | ICD-10-CM | POA: Diagnosis not present

## 2021-05-15 DIAGNOSIS — E785 Hyperlipidemia, unspecified: Secondary | ICD-10-CM | POA: Diagnosis not present

## 2021-05-15 DIAGNOSIS — I10 Essential (primary) hypertension: Secondary | ICD-10-CM | POA: Diagnosis not present

## 2021-05-15 DIAGNOSIS — M5114 Intervertebral disc disorders with radiculopathy, thoracic region: Secondary | ICD-10-CM | POA: Diagnosis not present

## 2021-05-15 DIAGNOSIS — I2510 Atherosclerotic heart disease of native coronary artery without angina pectoris: Secondary | ICD-10-CM | POA: Diagnosis not present

## 2021-05-16 ENCOUNTER — Ambulatory Visit: Payer: Medicare HMO

## 2021-05-16 DIAGNOSIS — I10 Essential (primary) hypertension: Secondary | ICD-10-CM | POA: Diagnosis not present

## 2021-05-16 DIAGNOSIS — R0602 Shortness of breath: Secondary | ICD-10-CM | POA: Diagnosis not present

## 2021-05-16 DIAGNOSIS — E785 Hyperlipidemia, unspecified: Secondary | ICD-10-CM | POA: Diagnosis not present

## 2021-05-16 DIAGNOSIS — I340 Nonrheumatic mitral (valve) insufficiency: Secondary | ICD-10-CM | POA: Diagnosis not present

## 2021-05-16 DIAGNOSIS — I2510 Atherosclerotic heart disease of native coronary artery without angina pectoris: Secondary | ICD-10-CM | POA: Diagnosis not present

## 2021-05-17 DIAGNOSIS — M546 Pain in thoracic spine: Secondary | ICD-10-CM | POA: Diagnosis not present

## 2021-05-18 DIAGNOSIS — E785 Hyperlipidemia, unspecified: Secondary | ICD-10-CM | POA: Diagnosis not present

## 2021-05-18 DIAGNOSIS — I2510 Atherosclerotic heart disease of native coronary artery without angina pectoris: Secondary | ICD-10-CM | POA: Diagnosis not present

## 2021-05-18 DIAGNOSIS — M5134 Other intervertebral disc degeneration, thoracic region: Secondary | ICD-10-CM | POA: Diagnosis not present

## 2021-05-18 DIAGNOSIS — I10 Essential (primary) hypertension: Secondary | ICD-10-CM | POA: Diagnosis not present

## 2021-06-05 DIAGNOSIS — M48061 Spinal stenosis, lumbar region without neurogenic claudication: Secondary | ICD-10-CM | POA: Diagnosis not present

## 2021-06-05 DIAGNOSIS — M5136 Other intervertebral disc degeneration, lumbar region: Secondary | ICD-10-CM | POA: Diagnosis not present

## 2021-06-05 DIAGNOSIS — Z6831 Body mass index (BMI) 31.0-31.9, adult: Secondary | ICD-10-CM | POA: Diagnosis not present

## 2021-06-05 DIAGNOSIS — I10 Essential (primary) hypertension: Secondary | ICD-10-CM | POA: Diagnosis not present

## 2021-07-11 ENCOUNTER — Ambulatory Visit: Payer: Self-pay

## 2021-07-11 ENCOUNTER — Ambulatory Visit: Payer: Medicare HMO | Admitting: Orthopedic Surgery

## 2021-07-11 ENCOUNTER — Encounter: Payer: Self-pay | Admitting: Orthopedic Surgery

## 2021-07-11 ENCOUNTER — Ambulatory Visit (INDEPENDENT_AMBULATORY_CARE_PROVIDER_SITE_OTHER): Payer: Medicare HMO

## 2021-07-11 DIAGNOSIS — M79641 Pain in right hand: Secondary | ICD-10-CM | POA: Diagnosis not present

## 2021-07-11 DIAGNOSIS — M79642 Pain in left hand: Secondary | ICD-10-CM

## 2021-07-11 DIAGNOSIS — M1812 Unilateral primary osteoarthritis of first carpometacarpal joint, left hand: Secondary | ICD-10-CM

## 2021-07-11 HISTORY — DX: Unilateral primary osteoarthritis of first carpometacarpal joint, left hand: M18.12

## 2021-07-11 MED ORDER — LIDOCAINE HCL 1 % IJ SOLN
1.0000 mL | INTRAMUSCULAR | Status: AC | PRN
  Administered 2021-07-11: 1 mL

## 2021-07-11 MED ORDER — BETAMETHASONE SOD PHOS & ACET 6 (3-3) MG/ML IJ SUSP
6.0000 mg | INTRAMUSCULAR | Status: AC | PRN
  Administered 2021-07-11: 6 mg via INTRA_ARTICULAR

## 2021-07-11 NOTE — Progress Notes (Signed)
? ?Office Visit Note ?  ?Patient: Gail Paul           ?Date of Birth: 1933-06-01           ?MRN: [id number] ?Visit Date: 07/11/2021 ?             ?Requested by: Perrin Maltese, MD ?[address] ?[place],  [place] [address] ?PCP: Perrin Maltese, MD ? ? ?Assessment & Plan: ?Visit Diagnoses:  ?1. Bilateral hand pain   ?2. Arthritis of carpometacarpal (CMC) joint of left thumb   ? ? ?Plan: We reviewed patient's x-rays which show a previous proximal row carpectomy of the right wrist and osteoarthritis involving the left thumb CMC joint.  Her left thumb CMC joint appears to be in the symptomatic area for her.  We discussed the nature of [place] arthritis as well as its diagnosis, prognosis, and both conservative and surgical treatment options.  After our discussion, she would like to proceed with a corticosteroid junction into the left thumb CMC joint.  She has very vague pain involving the right wrist that seems to be at the ulnar side of the wrist.  We will continue to monitor the symptoms in the right side for now. ? ?Follow-Up Instructions: No follow-ups on file.  ? ?Orders:  ?Orders Placed This Encounter  ?Procedures  ? XR Hand Complete Right  ? XR Hand Complete Left  ? ?No orders of the defined types were placed in this encounter. ? ? ? ? Procedures: ?Hand/UE Inj: R thumb CMC for osteoarthritis on 07/11/2021 5:40 PM ?Indications: pain and therapeutic ?Details: 25 G needle, fluoroscopy-guided radial approach ?Medications: 1 mL lidocaine 1 %; 6 mg betamethasone acetate-betamethasone sodium phosphate 6 (3-3) MG/ML ?Procedure, treatment alternatives, risks and benefits explained, specific risks discussed. Consent was given by the patient. Immediately prior to procedure a time out was called to verify the correct patient, procedure, equipment, support staff and site/side marked as required. Patient was prepped and draped in the usual sterile fashion.  ? ? ? ? ?Clinical Data: ?No additional findings. ? ? ?Subjective: ?Chief  Complaint  ?Patient presents with  ? Left Hand - New Patient (Initial Visit)  ? Right Hand - New Patient (Initial Visit)  ? ? ?This is an 88 year old right-hand-dominant female presents with bilateral hand pain.  On the left side, her pain is mostly at the thumb [facility] joint.  The thumb is pain is worse with attempted range of motion or prolonged activity.  She is difficulty with grip secondary to weakness and pain.  She has never had any treatment for this so far.  She has had multiple surgeries involving the right hand and wrist.  She appears to have undergone PRC for what sounds like wrist trauma.  She is not a very good historian regarding this background information.  She describes vague pain in the right hand and wrist.  This seems to be in the small finger which radiates proximally into the wrist.  She also notes mild cramping of the right middle finger. ? ? ?Review of Systems ? ? ?Objective: ?Vital Signs: There were no vitals taken for this visit. ? ?Physical Exam ?Constitutional:   ?   Appearance: Normal appearance.  ?Cardiovascular:  ?   Rate and Rhythm: Normal rate.  ?   Pulses: Normal pulses.  ?Pulmonary:  ?   Effort: Pulmonary effort is normal.  ?   Breath sounds: Wheezing present.  ?Skin: ?   General: Skin is warm and dry.  ?  Capillary Refill: Capillary refill takes less than 2 seconds.  ?Neurological:  ?   Mental Status: She is alert.  ? ? ?Right Hand Exam  ? ?Comments:  Limited wrist ROM at baseline.  No pain w/ DRUJ compression and no evidence of DRUJ instability.  Negative ECU synergy test w/out instability.  No TTP at radial styloid.  No pain w/ CMC grind test.  ? ? ?Left Hand Exam  ? ?Tenderness  ?Left hand tenderness location: TTP at base of thumb at [facility] joint w/ mild swelling.  ? ?Other  ?Erythema: absent ?Sensation: normal ?Pulse: present ? ?Comments:  + CMC grind test w/ pain and crepitus.  No MP hyper-extension.  No palmar abduction contracture ? ? ? ? ?Specialty Comments:  ?No specialty  comments available. ? ?Imaging: ?XR Hand Complete Left ? ?Result Date: 07/11/2021 ?Multiple views left hand taken today are reviewed interpreted by me.  They demonstrate [place] stage III osteoarthritis of the thumb CMC joint with joint space narrowing and osteophyte formation.  There appears to be well-maintained joint spaces at the STT joint.  There does appear to be degenerative changes at the index [facility] articulation.  There are some mild scattered degenerative changes throughout the IP joints of the fingers. ? ?XR Hand Complete Right ? ?Result Date: 07/11/2021 ?Multiple views of the right hand taken today reviewed interpreted by me.  They demonstrate a previous proximal row carpectomy with some small bony fragments in the radiocarpal aspect of the joint.  The DRUJ joint space appears to be well-maintained.  There are mild degenerative changes present at the thumb [facility] joint.  ? ? ?PMFS History: ?Patient Active Problem List  ? Diagnosis Date Noted  ? Arthritis of carpometacarpal [facility]) joint of left thumb 07/11/2021  ? Obesity (BMI 30-39.9) 11/01/2019  ? Adverse drug interaction with prescription medication 11/01/2019  ? Acute encephalopathy 10/31/2019  ? Diabetes ([place]) 12/16/2018  ? Essential hypertension 12/16/2018  ? CAD (coronary artery disease) 12/16/2018  ? Full thickness rotator cuff tear 12/16/2018  ? Radial styloid tenosynovitis 12/16/2018  ? Shoulder pain 12/16/2018  ? Sprain of rotator cuff capsule 12/16/2018  ? Stiffness of shoulder joint 12/16/2018  ? PAD (peripheral artery disease) ([place]) 12/16/2018  ? ?Past Medical History:  ?Diagnosis Date  ? Arthritis of carpometacarpal [facility]) joint of left thumb 07/11/2021  ? CHF (congestive heart failure) ([place])   ? Heart attack ([place])   ? Hypertension   ?  ?Family History  ?Problem Relation Age of Onset  ? Deep vein thrombosis Mother   ? Stroke Mother   ? Hypertension Father   ? Emphysema Father   ? Healthy Brother   ?  ?Past Surgical History:  ?Procedure Laterality Date  ?  BACK SURGERY    ? carpel tunnel surgery    ? CATARACT EXTRACTION    ? ROTATOR CUFF REPAIR Left   ? ?Social History  ? ?Occupational History  ? Not on file  ?Tobacco Use  ? Smoking status: Never  ? Smokeless tobacco: Never  ?Vaping Use  ? Vaping Use: Never used  ?Substance and Sexual Activity  ? Alcohol use: No  ? Drug use: No  ? Sexual activity: Not on file  ? ? ? ? ? ? ?

## 2021-07-28 ENCOUNTER — Other Ambulatory Visit: Payer: Self-pay | Admitting: Internal Medicine

## 2021-07-28 DIAGNOSIS — Z1231 Encounter for screening mammogram for malignant neoplasm of breast: Secondary | ICD-10-CM

## 2021-08-02 DIAGNOSIS — I2510 Atherosclerotic heart disease of native coronary artery without angina pectoris: Secondary | ICD-10-CM | POA: Diagnosis not present

## 2021-08-02 DIAGNOSIS — E785 Hyperlipidemia, unspecified: Secondary | ICD-10-CM | POA: Diagnosis not present

## 2021-08-02 DIAGNOSIS — I10 Essential (primary) hypertension: Secondary | ICD-10-CM | POA: Diagnosis not present

## 2021-08-22 ENCOUNTER — Ambulatory Visit: Payer: Medicare HMO | Admitting: Orthopedic Surgery

## 2021-08-22 DIAGNOSIS — M1812 Unilateral primary osteoarthritis of first carpometacarpal joint, left hand: Secondary | ICD-10-CM | POA: Diagnosis not present

## 2021-08-22 DIAGNOSIS — M79641 Pain in right hand: Secondary | ICD-10-CM | POA: Diagnosis not present

## 2021-08-22 DIAGNOSIS — Z9889 Other specified postprocedural states: Secondary | ICD-10-CM | POA: Insufficient documentation

## 2021-08-22 DIAGNOSIS — M79642 Pain in left hand: Secondary | ICD-10-CM | POA: Diagnosis not present

## 2021-08-22 MED ORDER — MELOXICAM 7.5 MG PO TABS: 7.5000 mg | ORAL_TABLET | Freq: Every day | ORAL | 0 refills | Status: AC

## 2021-08-22 NOTE — Progress Notes (Signed)
Office Visit Note   Patient: Gail Paul           Date of Birth: Jul 19, 1933           MRN: [id number] Visit Date: 08/22/2021              Requested by: Margaretann Loveless, MD [address] [place],  [place] [address] PCP: Margaretann Loveless, MD   Assessment & Plan: Visit Diagnoses:  1. Arthritis of carpometacarpal (CMC) joint of left thumb   2. Bilateral hand pain   3. Status post proximal row carpectomy of wrist     Plan: Most the patient's issues still seem to come from her left thumb CMC joint.  We again reviewed the nature of CMC arthritis and both conservative and surgical treatment options.  She is tried many conservative modalities including topical anti-inflammatory medication and corticosteroid injections and hand therapy.  We will try a left thumb spica brace and an oral anti-inflammatory medication to see if this helps.  We also discussed trapeziectomy with ligament reconstruction and tendon interposition.  She is not interested in surgery right now.  She seems to be having significant issues with her back and is planning on seeing a neurosurgeon soon to address this.  She can see me again as needed.  Follow-Up Instructions: No follow-ups on file.   Orders:  No orders of the defined types were placed in this encounter.  No orders of the defined types were placed in this encounter.     Procedures: No procedures performed   Clinical Data: No additional findings.   Subjective: Chief Complaint  Patient presents with   Right Hand - Follow-up   Left Hand - Follow-up   Right Thumb - Follow-up    This is a 88 year old female status post right wrist proximal row carpectomy who presents with continued bilateral hand pain.  She continues to have pain at the left thumb CMC joint.  She underwent corticosteroid junction on 07/11/2021 with minimal symptom relief.  She is also used a topical anti-inflammatory medication and has undergone hand therapy in the past.  She has a vague  and diffuse pain in the right wrist but this is not as big an issue for her as the left basilar thumb pain.  She is having significant problems with her back including muscle spasms and chronic back pain.  She is planning on seeing an urgent neurosurgeon for this issue in the near future.    Review of Systems   Objective: Vital Signs: There were no vitals taken for this visit.  Physical Exam  Right Hand Exam   Comments:  Limited ROM.  Neg CMC grind test.  No DRUJ pain or instability.    Left Hand Exam   Tenderness  Left hand tenderness location: TTP at base of thumb around [facility] joint.   Other  Erythema: absent Sensation: normal Pulse: present  Comments:  Positive CMC grind test w/ significant pain.  Mild to moderate swelling.       Specialty Comments:  No specialty comments available.  Imaging: No results found.   PMFS History: Patient Active Problem List   Diagnosis Date Noted   Bilateral hand pain 08/22/2021   Status post proximal row carpectomy of wrist 08/22/2021   Arthritis of carpometacarpal [facility]) joint of left thumb 07/11/2021   Obesity (BMI 30-39.9) 11/01/2019   Adverse drug interaction with prescription medication 11/01/2019   Acute encephalopathy 10/31/2019   Diabetes (HCC) 12/16/2018   Essential hypertension  12/16/2018   CAD (coronary artery disease) 12/16/2018   Full thickness rotator cuff tear 12/16/2018   Radial styloid tenosynovitis 12/16/2018   Shoulder pain 12/16/2018   Sprain of rotator cuff capsule 12/16/2018   Stiffness of shoulder joint 12/16/2018   PAD (peripheral artery disease) (HCC) 12/16/2018   Past Medical History:  Diagnosis Date   Arthritis of carpometacarpal (CMC) joint of left thumb 07/11/2021   CHF (congestive heart failure) (HCC)    Heart attack (HCC)    Hypertension     Family History  Problem Relation Age of Onset   Deep vein thrombosis Mother    Stroke Mother    Hypertension Father    Emphysema Father    Healthy  Brother     Past Surgical History:  Procedure Laterality Date   BACK SURGERY     carpel tunnel surgery     CATARACT EXTRACTION     ROTATOR CUFF REPAIR Left    Social History   Occupational History   Not on file  Tobacco Use   Smoking status: Never   Smokeless tobacco: Never  Vaping Use   Vaping Use: Never used  Substance and Sexual Activity   Alcohol use: No   Drug use: No   Sexual activity: Not on file

## 2021-08-30 ENCOUNTER — Ambulatory Visit
Admission: RE | Admit: 2021-08-30 | Discharge: 2021-08-30 | Disposition: A | Discharge status: Home / Self Care | Payer: Medicare HMO | Source: Ambulatory Visit | Attending: Internal Medicine | Admitting: Internal Medicine

## 2021-08-30 DIAGNOSIS — Z1231 Encounter for screening mammogram for malignant neoplasm of breast: Secondary | ICD-10-CM | POA: Insufficient documentation

## 2021-08-31 DIAGNOSIS — E669 Obesity, unspecified: Secondary | ICD-10-CM | POA: Diagnosis not present

## 2021-08-31 DIAGNOSIS — E785 Hyperlipidemia, unspecified: Secondary | ICD-10-CM | POA: Diagnosis not present

## 2021-08-31 DIAGNOSIS — I2510 Atherosclerotic heart disease of native coronary artery without angina pectoris: Secondary | ICD-10-CM | POA: Diagnosis not present

## 2021-08-31 DIAGNOSIS — I10 Essential (primary) hypertension: Secondary | ICD-10-CM | POA: Diagnosis not present

## 2021-08-31 DIAGNOSIS — R079 Chest pain, unspecified: Secondary | ICD-10-CM | POA: Diagnosis not present

## 2021-08-31 DIAGNOSIS — I340 Nonrheumatic mitral (valve) insufficiency: Secondary | ICD-10-CM | POA: Diagnosis not present

## 2021-09-11 DIAGNOSIS — R079 Chest pain, unspecified: Secondary | ICD-10-CM | POA: Diagnosis not present

## 2021-09-12 DIAGNOSIS — R079 Chest pain, unspecified: Secondary | ICD-10-CM | POA: Diagnosis not present

## 2021-09-15 DIAGNOSIS — I10 Essential (primary) hypertension: Secondary | ICD-10-CM | POA: Diagnosis not present

## 2021-09-15 DIAGNOSIS — E785 Hyperlipidemia, unspecified: Secondary | ICD-10-CM | POA: Diagnosis not present

## 2021-09-15 DIAGNOSIS — E669 Obesity, unspecified: Secondary | ICD-10-CM | POA: Diagnosis not present

## 2021-09-15 DIAGNOSIS — I2510 Atherosclerotic heart disease of native coronary artery without angina pectoris: Secondary | ICD-10-CM | POA: Diagnosis not present

## 2021-09-15 DIAGNOSIS — I340 Nonrheumatic mitral (valve) insufficiency: Secondary | ICD-10-CM | POA: Diagnosis not present

## 2021-09-18 DIAGNOSIS — I10 Essential (primary) hypertension: Secondary | ICD-10-CM | POA: Diagnosis not present

## 2021-09-18 DIAGNOSIS — I2510 Atherosclerotic heart disease of native coronary artery without angina pectoris: Secondary | ICD-10-CM | POA: Diagnosis not present

## 2021-09-18 DIAGNOSIS — E785 Hyperlipidemia, unspecified: Secondary | ICD-10-CM | POA: Diagnosis not present

## 2021-09-18 DIAGNOSIS — R7302 Impaired glucose tolerance (oral): Secondary | ICD-10-CM | POA: Diagnosis not present

## 2021-09-25 DIAGNOSIS — M19011 Primary osteoarthritis, right shoulder: Secondary | ICD-10-CM | POA: Diagnosis not present

## 2021-09-25 DIAGNOSIS — M75121 Complete rotator cuff tear or rupture of right shoulder, not specified as traumatic: Secondary | ICD-10-CM | POA: Diagnosis not present

## 2021-09-25 DIAGNOSIS — M4142 Neuromuscular scoliosis, cervical region: Secondary | ICD-10-CM | POA: Diagnosis not present

## 2021-09-29 DIAGNOSIS — M25511 Pain in right shoulder: Secondary | ICD-10-CM | POA: Diagnosis not present

## 2021-09-29 DIAGNOSIS — M75121 Complete rotator cuff tear or rupture of right shoulder, not specified as traumatic: Secondary | ICD-10-CM | POA: Diagnosis not present

## 2021-09-29 DIAGNOSIS — M25311 Other instability, right shoulder: Secondary | ICD-10-CM | POA: Diagnosis not present

## 2021-10-04 DIAGNOSIS — M75121 Complete rotator cuff tear or rupture of right shoulder, not specified as traumatic: Secondary | ICD-10-CM | POA: Diagnosis not present

## 2021-10-04 DIAGNOSIS — M25511 Pain in right shoulder: Secondary | ICD-10-CM | POA: Diagnosis not present

## 2021-10-04 DIAGNOSIS — M25311 Other instability, right shoulder: Secondary | ICD-10-CM | POA: Diagnosis not present

## 2021-10-10 DIAGNOSIS — M25511 Pain in right shoulder: Secondary | ICD-10-CM | POA: Diagnosis not present

## 2021-10-10 DIAGNOSIS — M25311 Other instability, right shoulder: Secondary | ICD-10-CM | POA: Diagnosis not present

## 2021-10-10 DIAGNOSIS — M75121 Complete rotator cuff tear or rupture of right shoulder, not specified as traumatic: Secondary | ICD-10-CM | POA: Diagnosis not present

## 2021-10-16 DIAGNOSIS — M25512 Pain in left shoulder: Secondary | ICD-10-CM | POA: Diagnosis not present

## 2021-10-16 DIAGNOSIS — M25511 Pain in right shoulder: Secondary | ICD-10-CM | POA: Diagnosis not present

## 2021-10-25 DIAGNOSIS — M25511 Pain in right shoulder: Secondary | ICD-10-CM | POA: Diagnosis not present

## 2021-10-25 DIAGNOSIS — M19011 Primary osteoarthritis, right shoulder: Secondary | ICD-10-CM | POA: Diagnosis not present

## 2021-11-13 DIAGNOSIS — M47816 Spondylosis without myelopathy or radiculopathy, lumbar region: Secondary | ICD-10-CM | POA: Diagnosis not present

## 2021-11-30 DIAGNOSIS — E669 Obesity, unspecified: Secondary | ICD-10-CM | POA: Diagnosis not present

## 2021-11-30 DIAGNOSIS — I10 Essential (primary) hypertension: Secondary | ICD-10-CM | POA: Diagnosis not present

## 2021-11-30 DIAGNOSIS — I2510 Atherosclerotic heart disease of native coronary artery without angina pectoris: Secondary | ICD-10-CM | POA: Diagnosis not present

## 2021-11-30 DIAGNOSIS — E785 Hyperlipidemia, unspecified: Secondary | ICD-10-CM | POA: Diagnosis not present

## 2021-11-30 DIAGNOSIS — I340 Nonrheumatic mitral (valve) insufficiency: Secondary | ICD-10-CM | POA: Diagnosis not present

## 2021-12-12 DIAGNOSIS — H524 Presbyopia: Secondary | ICD-10-CM | POA: Diagnosis not present

## 2021-12-12 DIAGNOSIS — Z0100 Encounter for examination of eyes and vision without abnormal findings: Secondary | ICD-10-CM | POA: Diagnosis not present

## 2021-12-19 DIAGNOSIS — I2510 Atherosclerotic heart disease of native coronary artery without angina pectoris: Secondary | ICD-10-CM | POA: Diagnosis not present

## 2021-12-19 DIAGNOSIS — E785 Hyperlipidemia, unspecified: Secondary | ICD-10-CM | POA: Diagnosis not present

## 2021-12-19 DIAGNOSIS — I509 Heart failure, unspecified: Secondary | ICD-10-CM | POA: Diagnosis not present

## 2021-12-19 DIAGNOSIS — I10 Essential (primary) hypertension: Secondary | ICD-10-CM | POA: Diagnosis not present

## 2021-12-19 DIAGNOSIS — R7302 Impaired glucose tolerance (oral): Secondary | ICD-10-CM | POA: Diagnosis not present

## 2022-01-01 ENCOUNTER — Encounter (INDEPENDENT_AMBULATORY_CARE_PROVIDER_SITE_OTHER): Payer: Self-pay

## 2022-01-02 DIAGNOSIS — E1121 Type 2 diabetes mellitus with diabetic nephropathy: Secondary | ICD-10-CM | POA: Diagnosis not present

## 2022-01-02 DIAGNOSIS — Z23 Encounter for immunization: Secondary | ICD-10-CM | POA: Diagnosis not present

## 2022-01-02 DIAGNOSIS — E785 Hyperlipidemia, unspecified: Secondary | ICD-10-CM | POA: Diagnosis not present

## 2022-01-02 DIAGNOSIS — I10 Essential (primary) hypertension: Secondary | ICD-10-CM | POA: Diagnosis not present

## 2022-01-02 DIAGNOSIS — Z0000 Encounter for general adult medical examination without abnormal findings: Secondary | ICD-10-CM | POA: Diagnosis not present

## 2022-02-01 DIAGNOSIS — I2510 Atherosclerotic heart disease of native coronary artery without angina pectoris: Secondary | ICD-10-CM | POA: Diagnosis not present

## 2022-02-01 DIAGNOSIS — I10 Essential (primary) hypertension: Secondary | ICD-10-CM | POA: Diagnosis not present

## 2022-03-12 DIAGNOSIS — E785 Hyperlipidemia, unspecified: Secondary | ICD-10-CM | POA: Diagnosis not present

## 2022-03-12 DIAGNOSIS — E1122 Type 2 diabetes mellitus with diabetic chronic kidney disease: Secondary | ICD-10-CM | POA: Diagnosis not present

## 2022-03-12 DIAGNOSIS — M1990 Unspecified osteoarthritis, unspecified site: Secondary | ICD-10-CM | POA: Diagnosis not present

## 2022-03-12 DIAGNOSIS — I10 Essential (primary) hypertension: Secondary | ICD-10-CM | POA: Diagnosis not present

## 2022-03-12 DIAGNOSIS — M5125 Other intervertebral disc displacement, thoracolumbar region: Secondary | ICD-10-CM | POA: Diagnosis not present

## 2022-03-12 DIAGNOSIS — I2510 Atherosclerotic heart disease of native coronary artery without angina pectoris: Secondary | ICD-10-CM | POA: Diagnosis not present

## 2022-04-06 ENCOUNTER — Telehealth: Payer: Self-pay

## 2022-04-09 NOTE — Telephone Encounter (Signed)
Diabetes (DM) Review Call  Gail Paul,Gail Paul  88 years, Female  DOB: 11-12-1933  M:   __________________________________________________ Clinical Lead Review  Review Adherence gaps identified?: Yes Details: Gail Paul - was it never started or did she start and then discontinue? Drug Therapy Problems identified?: No Assessment: Controlled  Diabetes (DM) Review (HC) Chart Review A1C Reading #1 (last): 6.7 on: 12/19/2021 A1C Reading #2: 6.4 on: 09/18/2021 When was patient's last eye exam?: About a year ago  The patient's glycemic control has: Worsened (> 0.3 increase) What recent interventions have been made by any provider to improve the patient's conditions in the last 3 months?: Office Visit: 03/12/22 Gail Maltese MD. For follow-up. STARTED Lyrcia 35 mg 1 every bedtime. Has there been any documented recent hospitalizations or ED visits since last visit with Clinical Lead?: No Is the patient currently on a STATIN medication?: Yes Is the patient currently on ACE/ARB medication?: No  Adherence Review Does the [facility] have access to medication refill data?: Yes Adherence rates for STAR metric medications: Farxiga 10 mg - Patient stated she does not take this medication any longer Rosuvastatin 40 mg - 03/08/21 90 DS Adherence rates for medications indicated for disease state being reviewed: Farxiga 10 mg - Patient stated she does not take this medication any longer Does the patient have >5 day gap between last estimated fill dates for any of the above medications?: No  Disease State Questions Able to connect with the Patient?: Yes Are you currently checking your blood sugars?: No Review recommendations from CPP of how often should be checking and encourage monitoring blood sugars.: Done Is the patient having any signs or symptoms of low blood sugars (breaking out in sweat, shaky, confusion, dizziness)?: No Is the patient checking their feet daily/regularly?: Yes Are there any cuts, swelling,  blisters, redness, or any signs of infections?: No When was your last dentist appointment? (6 Month recommendation): Last year  What diet changes have you made to improve your Blood Sugar level?: eating more home-cooked meals What exercise are you doing to improve your Blood Sugar level?: no formal exercise  Engagement Notes Gail Paul on 04/06/2022 04:15 PM CPP review : 81mins Gail Paul on 04/06/2022 02:36 PM R/S appointment to 06/18/22 at 1:00 PM F/U Phone Gail Paul on 04/05/2022 04:11 PM [facility] Chart Review: 8 min 04/05/22  [facility] Assessment call time spent: 17 min 04/06/22  CPP Office Visit Documentation: CPP Coordination of Care: CPP Care Plan Review:

## 2022-04-12 ENCOUNTER — Ambulatory Visit: Payer: Medicare HMO | Admitting: Internal Medicine

## 2022-04-24 DIAGNOSIS — M47816 Spondylosis without myelopathy or radiculopathy, lumbar region: Secondary | ICD-10-CM | POA: Diagnosis not present

## 2022-05-01 DIAGNOSIS — M47816 Spondylosis without myelopathy or radiculopathy, lumbar region: Secondary | ICD-10-CM | POA: Diagnosis not present

## 2022-05-04 ENCOUNTER — Encounter: Payer: Self-pay | Admitting: Internal Medicine

## 2022-05-04 ENCOUNTER — Ambulatory Visit (INDEPENDENT_AMBULATORY_CARE_PROVIDER_SITE_OTHER): Payer: Medicare HMO | Admitting: Internal Medicine

## 2022-05-04 VITALS — BP 162/70 | HR 94 | Ht 59.0 in | Wt 176.0 lb

## 2022-05-04 DIAGNOSIS — N182 Chronic kidney disease, stage 2 (mild): Secondary | ICD-10-CM | POA: Diagnosis not present

## 2022-05-04 DIAGNOSIS — I2510 Atherosclerotic heart disease of native coronary artery without angina pectoris: Secondary | ICD-10-CM | POA: Diagnosis not present

## 2022-05-04 DIAGNOSIS — E782 Mixed hyperlipidemia: Secondary | ICD-10-CM | POA: Insufficient documentation

## 2022-05-04 DIAGNOSIS — F411 Generalized anxiety disorder: Secondary | ICD-10-CM | POA: Insufficient documentation

## 2022-05-04 DIAGNOSIS — E1122 Type 2 diabetes mellitus with diabetic chronic kidney disease: Secondary | ICD-10-CM | POA: Diagnosis not present

## 2022-05-04 DIAGNOSIS — J4520 Mild intermittent asthma, uncomplicated: Secondary | ICD-10-CM | POA: Insufficient documentation

## 2022-05-04 DIAGNOSIS — I10 Essential (primary) hypertension: Secondary | ICD-10-CM

## 2022-05-04 DIAGNOSIS — M4724 Other spondylosis with radiculopathy, thoracic region: Secondary | ICD-10-CM | POA: Diagnosis not present

## 2022-05-04 MED ORDER — PREGABALIN 25 MG PO CAPS: 25.0000 mg | ORAL_CAPSULE | Freq: Two times a day (BID) | ORAL | 0 refills | Status: DC

## 2022-05-04 MED ORDER — ALBUTEROL SULFATE HFA 108 (90 BASE) MCG/ACT IN AERS: 2.0000 | INHALATION_SPRAY | Freq: Four times a day (QID) | RESPIRATORY_TRACT | 2 refills | Status: DC | PRN

## 2022-05-04 NOTE — Progress Notes (Signed)
Established Patient Office Visit  Subjective:  Patient ID: Gail Paul, female    DOB: November 11, 1933  Age: 89 y.o. MRN: [id number]  Chief Complaint  Patient presents with   Follow-up    Follow up    Patient comes in very anxious and tearful today because of her chronic back pain. She had an epidural injection in her T-spine yesterday, and had to lie prone for several hours which made her very uncomfortable.  She says she has never felt this bad before, and the back is still hurting.  Patient was reassured that this process will take a few days before the pain responds to the injection.  Currently she only has meloxicam and a muscle relaxant.  Patient is intolerant to most of the pain medication.  She does not have a bottle of Lyrica which was started previously, apparently she never picked it up.  Will try sending in a new prescription for Lyrica to be taken once or twice a day as tolerated.  She had an MRI of the T-spine in March 2023.  Will send a copy to her orthopedic surgeon and then will decide if she needs a new MRI.     Past Medical History:  Diagnosis Date   Arthritis of carpometacarpal [facility]) joint of left thumb 07/11/2021   CHF (congestive heart failure) (HCC)    Heart attack ([place])    Hypertension    Past Surgical History:  Procedure Laterality Date   BACK SURGERY     carpel tunnel surgery     CATARACT EXTRACTION     ROTATOR CUFF REPAIR Left     Social History   Socioeconomic History   Marital status: Divorced    Spouse name: Not on file   Number of children: Not on file   Years of education: Not on file   Highest education level: Not on file  Occupational History   Not on file  Tobacco Use   Smoking status: Never   Smokeless tobacco: Never  Vaping Use   Vaping Use: Never used  Substance and Sexual Activity   Alcohol use: No   Drug use: No   Sexual activity: Not on file  Other Topics Concern   Not on file  Social History Narrative   Not on file    Social Determinants of Health   Financial Resource Strain: Not on file  Food Insecurity: Not on file  Transportation Needs: Not on file  Physical Activity: Not on file  Stress: Not on file  Social Connections: Not on file  Intimate Partner Violence: Not on file    Family History  Problem Relation Age of Onset   Deep vein thrombosis Mother    Stroke Mother    Hypertension Father    Emphysema Father    Healthy Brother     Allergies  Allergen Reactions   Shellfish Allergy Swelling   Fenofibrate    Meloxicam     Review of Systems  Constitutional:  Positive for malaise/fatigue. Negative for chills, fever and weight loss.  HENT:  Positive for hearing loss.   Eyes: Negative.   Respiratory: Negative.    Cardiovascular: Negative.   Gastrointestinal: Negative.   Genitourinary: Negative.   Musculoskeletal:  Positive for back pain, joint pain and myalgias. Negative for falls and neck pain.  Skin: Negative.   Neurological: Negative.   Psychiatric/Behavioral:  Negative for depression, memory loss, substance abuse and suicidal ideas. The patient is nervous/anxious and has insomnia.  Objective:   BP (!) 162/70   Pulse 94   Ht '4\' 11"'$  (1.499 m)   Wt 176 lb (79.8 kg)   SpO2 96%   BMI 35.55 kg/m   Vitals:   05/04/22 1423  BP: (!) 162/70  Pulse: 94  Height: '4\' 11"'$  (1.499 m)  Weight: 176 lb (79.8 kg)  SpO2: 96%  BMI (Calculated): 35.53    Physical Exam Vitals and nursing note reviewed.  Constitutional:      Appearance: Normal appearance.  HENT:     Head: Normocephalic and atraumatic.  Cardiovascular:     Rate and Rhythm: Normal rate and regular rhythm.  Pulmonary:     Effort: Pulmonary effort is normal.     Breath sounds: Normal breath sounds.  Abdominal:     General: Abdomen is flat. Bowel sounds are normal.     Palpations: Abdomen is soft.  Musculoskeletal:        General: Tenderness (over mid back) present.     Cervical back: Normal range of  motion and neck supple.  Skin:    General: Skin is warm.  Neurological:     General: No focal deficit present.     Mental Status: She is alert and oriented to person, place, and time.  Psychiatric:        Mood and Affect: Mood normal.        Behavior: Behavior normal.      No results found for any visits on 05/04/22.  No results found for this or any previous visit (from the past 2160 hour(s)).    Assessment & Plan:  Lyrica prescription sent at 25 mg p.o. once a day or twice a day as tolerated. Patient will get blood work at next follow-up. Problem List Items Addressed This Visit     Diabetes ([place])   Relevant Medications   rosuvastatin (CRESTOR) 40 MG tablet   Essential hypertension   Relevant Medications   VASCEPA 1 g capsule   rosuvastatin (CRESTOR) 40 MG tablet   nitroGLYCERIN (NITROSTAT) 0.4 MG SL tablet   CAD (coronary artery disease)   Relevant Medications   VASCEPA 1 g capsule   rosuvastatin (CRESTOR) 40 MG tablet   nitroGLYCERIN (NITROSTAT) 0.4 MG SL tablet   GAD (generalized anxiety disorder)   Relevant Medications   hydrOXYzine (ATARAX) 10 MG tablet   Mixed hyperlipidemia   Relevant Medications   VASCEPA 1 g capsule   rosuvastatin (CRESTOR) 40 MG tablet   nitroGLYCERIN (NITROSTAT) 0.4 MG SL tablet   Thoracic spondylosis with radiculopathy - Primary   Relevant Medications   hydrOXYzine (ATARAX) 10 MG tablet   pregabalin (LYRICA) 25 MG capsule   Mild intermittent asthma without complication   Relevant Medications   albuterol (VENTOLIN HFA) 108 (90 Base) MCG/ACT inhaler    Return in about 2 weeks (around 05/18/2022).   Total time spent: 30 minutes  Perrin Maltese, MD  05/04/2022

## 2022-05-10 ENCOUNTER — Ambulatory Visit: Payer: Medicare HMO | Admitting: Cardiovascular Disease

## 2022-05-10 ENCOUNTER — Encounter: Payer: Self-pay | Admitting: Cardiovascular Disease

## 2022-05-10 VITALS — BP 130/80 | Ht 62.0 in | Wt 169.8 lb

## 2022-05-10 DIAGNOSIS — I739 Peripheral vascular disease, unspecified: Secondary | ICD-10-CM

## 2022-05-10 DIAGNOSIS — R0789 Other chest pain: Secondary | ICD-10-CM | POA: Diagnosis not present

## 2022-05-10 DIAGNOSIS — I10 Essential (primary) hypertension: Secondary | ICD-10-CM

## 2022-05-10 DIAGNOSIS — M4724 Other spondylosis with radiculopathy, thoracic region: Secondary | ICD-10-CM | POA: Diagnosis not present

## 2022-05-10 DIAGNOSIS — I2510 Atherosclerotic heart disease of native coronary artery without angina pectoris: Secondary | ICD-10-CM | POA: Diagnosis not present

## 2022-05-10 NOTE — Progress Notes (Signed)
Cardiology Office Note   Date:  05/10/2022   ID:  Gail Paul, DOB 04/12/1933, MRN [id number]  PCP:  Perrin Maltese, MD  Cardiologist:  Neoma Laming, MD      History of Present Illness: Gail Paul is a 89 y.o. female who presents for  Chief Complaint  Patient presents with   Follow-up    Follow up    After getting injection in spine developed vomiting/chest pain. But feels fine , as at that time BPS 192.  Chest Pain  This is a new problem. The current episode started 1 to 4 weeks ago. The pain is present in the substernal region. The pain is at a severity of 3/10. Associated symptoms include dizziness, nausea, shortness of breath and vomiting.      Past Medical History:  Diagnosis Date   Arthritis of carpometacarpal [facility]) joint of left thumb 07/11/2021   CHF (congestive heart failure) (HCC)    Heart attack ([place])    Hypertension      Past Surgical History:  Procedure Laterality Date   BACK SURGERY     carpel tunnel surgery     CATARACT EXTRACTION     ROTATOR CUFF REPAIR Left      Current Outpatient Medications  Medication Sig Dispense Refill   albuterol (VENTOLIN HFA) 108 (90 Base) MCG/ACT inhaler Inhale 2 puffs into the lungs every 6 (six) hours as needed for wheezing or shortness of breath. 8 g 2   amLODipine (NORVASC) 5 MG tablet Take 5 mg by mouth daily.     aspirin 81 MG tablet Take 81 mg by mouth daily.     busPIRone (BUSPAR) 10 MG tablet Take 10 mg by mouth 2 (two) times daily.      cholecalciferol (VITAMIN D) 1000 UNITS tablet Take 1,000 Units by mouth daily.     clopidogrel (PLAVIX) 75 MG tablet Take 75 mg by mouth daily.     DULoxetine (CYMBALTA) 30 MG capsule Take 30 mg by mouth daily.      hydrOXYzine (ATARAX) 10 MG tablet Take 10 mg by mouth 3 (three) times daily as needed.     metoprolol succinate (TOPROL-XL) 25 MG 24 hr tablet Take 25 mg by mouth daily.     Multiple Vitamins-Minerals (CENTRUM SILVER 50+WOMEN PO) Take by mouth daily.      nitroGLYCERIN (NITROSTAT) 0.4 MG SL tablet Place 0.4 mg under the tongue every 5 (five) minutes as needed.     OVER THE COUNTER MEDICATION at bedtime. CBD     OVER THE COUNTER MEDICATION daily. BEET ROOT     PREBIOTIC PRODUCT PO Take by mouth daily.     pregabalin (LYRICA) 25 MG capsule Take 1 capsule (25 mg total) by mouth 2 (two) times daily. 60 capsule 0   Probiotic Product (PROBIOTIC PO) Take by mouth daily.     rosuvastatin (CRESTOR) 40 MG tablet Take 40 mg by mouth daily.     VASCEPA 1 g capsule Take 2 g by mouth 2 (two) times daily.     No current facility-administered medications for this visit.    Allergies:   Shellfish allergy, Fenofibrate, and Meloxicam    Social History:   reports that she has never smoked. She has never used smokeless tobacco. She reports that she does not drink alcohol and does not use drugs.   Family History:  family history includes Deep vein thrombosis in her mother; Emphysema in her father; Healthy in her brother; Hypertension in her  father; Stroke in her mother.    ROS:     Review of Systems  Constitutional: Negative.   HENT: Negative.    Eyes: Negative.   Respiratory:  Positive for shortness of breath.   Cardiovascular:  Positive for chest pain.  Gastrointestinal:  Positive for nausea and vomiting.  Genitourinary: Negative.   Musculoskeletal: Negative.   Skin: Negative.   Neurological:  Positive for dizziness.  Endo/Heme/Allergies: Negative.   Psychiatric/Behavioral: Negative.    All other systems reviewed and are negative.     All other systems are reviewed and negative.    PHYSICAL EXAM: VS:  BP 130/80   Ht '5\' 2"'$  (1.575 m)   Wt 169 lb 12.8 oz (77 kg)   BMI 31.06 kg/m  , BMI Body mass index is 31.06 kg/m. Last weight:  Wt Readings from Last 3 Encounters:  05/10/22 169 lb 12.8 oz (77 kg)  05/04/22 176 lb (79.8 kg)  06/20/20 171 lb 9.6 oz (77.8 kg)     Physical Exam Constitutional:      Appearance: Normal appearance.   Cardiovascular:     Rate and Rhythm: Normal rate and regular rhythm.     Heart sounds: Normal heart sounds.  Pulmonary:     Effort: Pulmonary effort is normal.     Breath sounds: Normal breath sounds.  Musculoskeletal:     Right lower leg: No edema.     Left lower leg: No edema.  Neurological:     Mental Status: She is alert.       EKG:   Recent Labs: No results found for requested labs within last 365 days.    Lipid Panel    Component Value Date/Time   CHOL 205 (H) 12/21/2012 1855   TRIG 167 12/21/2012 1855   HDL 44 12/21/2012 1855   VLDL 33 12/21/2012 1855   LDLCALC 128 (H) 12/21/2012 1855      REASON FOR VISIT  Visit for: Echocardiogram/R 07.9  Sex:   Female  wt=175    lbs.  BP=120/72  Height=63    inches.        TESTS  Imaging: Echocardiogram:  An echocardiogram in (2-d) mode was performed and in Doppler mode with color flow velocity mapping was performed. The aortic valve cusps are abnormal 1.4  cm, flow velocity .123456  m/s, and systolic calculated mean flow gradient 2  mmHg. Mitral valve diastolic peak flow velocity E .444   m/s and E/A ratio 0.8. Aortic root diameter 3.4  cm. The LVOT internal diameter 3.6  cm and flow velocity was abnormal .82   m/s. LV systolic dimension [id number]   cm, diastolic 3.4  cm, posterior wall thickness 1.79    cm, fractional shortening 31.2  %, and EF 60.1  %. IVS thickness 1.47  cm. LA dimension 3.2 cm. Mitral Valve has Trace Regurgitation.     ASSESSMENT  Technically adequate study.  Normal chamber sizes.  Normal left ventricular systolic function.  Mild left ventricular hypertrophy with GRADE 3 (restrictive physiology) diastolic dysfunction.  Normal right ventricular systolic function.  Normal right ventricular diastolic function.  Normal left ventricular wall motion.  Normal right ventricular wall motion.  Normal pulmonary artery pressure.  Trace mitral regurgitation.  Fibrocalcified aortic valve.  No pericardial  effusion.  Mildly dilated Left atrium  Severe LVH.     THERAPY   Referring physician: Dionisio David  Sonographer: Neoma Laming.      Neoma Laming MD  Electronically signed by: Neoma Laming  Date: 09/12/2021 09:06 TESTS                                                                                          [facility] [address] [place], [place] [address] [phone] STUDY:  Gated Stress / Rest Myocardial Perfusion Imaging Tomographic (SPECT) Including attenuation correction Wall Motion, Left Ventricular Ejection Fraction By Gated Technique.Treadmill Stress Test. SEX: Female   WEIGHT: 172 lbs   HEIGHT: 63 in      ARMS UP: YES/NO                                                                        REFERRING PHYSICIAN: Dr.Charlotte Brafford Humphrey Rolls                                                                                                                                                                                                                       INDICATION FOR STUDY: CP  TECHNIQUE:  Approximately 20 minutes following the intravenous administration of 10.6 mCi of Tc-4mSestamibi after stress testing in a reclined supine position with arms above their head if able to do so, gated SPECT imaging of the heart was performed. After about a 2hr break, the patient was injected intravenously with 32.3 mCi of Tc-972mestamibi.  Approximately 45 minutes later in the same position as stress imaging SPECT rest imaging of the heart was performed.  STRESS BY:  ShNeoma LamingMD PROTOCOL: Modified Bruce                                                                                         MAX PRED HR: 134                     85%: 114               75%: 101                                                                                                                    RESTING BP: 146/76   RESTING HR: 82 PEAK BP: 164/92   PEAK HR: 109 (81%)                                                                    EXERCISE DURATION:  3:26                                            METS:  2.1    REASON FOR TEST TERMINATION: Fatigue. SOB.  SYMPTOMS: Fatigue. SOB.  [place] TREADMILL SCORE:  3                                      RISK:  Moderate                                                                                                                                                                                                           EKG RESULTS: Baseline. NSR. 85/min. No significant ST changes at peak exercise.                                                              IMAGE QUALITY:  Good  PERFUSION/WALL MOTION FINDINGS: EF = 47%. No perfusion defects, normal wall motion.                                                                           IMPRESSION: Normal stress test with mild to moderate LV dysfunction.                                                                                                                                                                                                                                                                                         Neoma Laming, MD Stress Interpreting Physician / Nuclear Interpreting Physician                Neoma Laming MD  Electronically signed by:  Neoma Laming     Date: 09/15/2021 12:10 Other studies Reviewed: Additional studies/ records that were reviewed today include:  Review of the above records demonstrates:   REASON FOR VISIT  Referred by Dr.Elizabelle Fite Humphrey Rolls.        TESTS  Imaging: Computed Tomographic Angiography:  Cardiac multidetector CT was performed paying particular attention to the coronary arteries for the diagnosis of: CAD. Diagnostic Drugs:  Administered iohexol (Omnipaque) through an antecubital vein and images from the examination were analyzed for the presence and extent of coronary artery disease, using 3D image processing software. 100 mL of non-ionic contrast (Omnipaque) was used.        TEST CONCLUSIONS  Quality of study: Good  1-Calcium score: 921.0  2-Right dominant system.  3-Mild to moderate diffuse calcification of LAD, LCX and RCA with mild diffuse disease, treat medically.   Neoma Laming MD  Electronically signed by: Neoma Laming     Date: 03/14/2018 14:51     No data to display            ASSESSMENT AND PLAN:    ICD-10-CM  1. Other chest pain  R07.89 PCV ECHOCARDIOGRAM COMPLETE   had episode chest pain, but fine now, will do echo    2. Coronary artery disease involving native coronary artery of native heart without angina pectoris  I25.10 PCV ECHOCARDIOGRAM COMPLETE    3. Essential hypertension  I10 PCV ECHOCARDIOGRAM COMPLETE    4. PAD (peripheral artery disease) (HCC)  I73.9 PCV ECHOCARDIOGRAM COMPLETE    5. Thoracic spondylosis with radiculopathy  M47.24 PCV ECHOCARDIOGRAM COMPLETE       Problem List Items Addressed This Visit       Cardiovascular and Mediastinum   Essential hypertension   Relevant Orders   PCV ECHOCARDIOGRAM COMPLETE   CAD (coronary artery disease)   Relevant Orders   PCV ECHOCARDIOGRAM COMPLETE   PAD (peripheral artery disease) ([place])   Relevant Orders   PCV ECHOCARDIOGRAM COMPLETE     Nervous and Auditory   Thoracic spondylosis with  radiculopathy   Relevant Orders   PCV ECHOCARDIOGRAM COMPLETE   Other Visit Diagnoses     Other chest pain    -  Primary   had episode chest pain, but fine now, will do echo   Relevant Orders   PCV ECHOCARDIOGRAM COMPLETE          Disposition:   Return in about 4 weeks (around 06/07/2022) for echo and f/u.    Total time spent: 30  minutes  Signed,  Neoma Laming, MD  05/10/2022 2:30 [place]

## 2022-05-11 ENCOUNTER — Other Ambulatory Visit: Payer: Self-pay | Admitting: Physical Medicine and Rehabilitation

## 2022-05-11 DIAGNOSIS — M546 Pain in thoracic spine: Secondary | ICD-10-CM

## 2022-05-18 ENCOUNTER — Ambulatory Visit (INDEPENDENT_AMBULATORY_CARE_PROVIDER_SITE_OTHER): Payer: Medicare HMO | Admitting: Internal Medicine

## 2022-05-18 ENCOUNTER — Encounter: Payer: Self-pay | Admitting: Internal Medicine

## 2022-05-18 VITALS — BP 128/70 | HR 94 | Ht 59.0 in | Wt 172.0 lb

## 2022-05-18 DIAGNOSIS — I2510 Atherosclerotic heart disease of native coronary artery without angina pectoris: Secondary | ICD-10-CM

## 2022-05-18 DIAGNOSIS — N182 Chronic kidney disease, stage 2 (mild): Secondary | ICD-10-CM

## 2022-05-18 DIAGNOSIS — E782 Mixed hyperlipidemia: Secondary | ICD-10-CM | POA: Diagnosis not present

## 2022-05-18 DIAGNOSIS — M4724 Other spondylosis with radiculopathy, thoracic region: Secondary | ICD-10-CM

## 2022-05-18 DIAGNOSIS — I10 Essential (primary) hypertension: Secondary | ICD-10-CM

## 2022-05-18 DIAGNOSIS — E1122 Type 2 diabetes mellitus with diabetic chronic kidney disease: Secondary | ICD-10-CM | POA: Diagnosis not present

## 2022-05-18 NOTE — Progress Notes (Signed)
Established Patient Office Visit  Subjective:  Patient ID: AUNDREA FRUHLING, female    DOB: Feb 09, 1934  Age: 89 y.o. MRN: [id number]  Chief Complaint  Patient presents with   Follow-up    2 week follow up    Patient comes in for her follow-up today.  She is accompanied by a family member.  Today she is feeling much better and is in good spirits.  Her back pain has eased up a little bit although it still there.  At her last visit she had just received an epidural injection and Lyrica was also added at 25 mg at bedtime.  Patient thinks it is helping somewhat.  Will consider increasing to 25 mg twice a day if tolerated. Patient does not have any other complaints. She will get her labs today.    No other concerns at this time.   Past Medical History:  Diagnosis Date   Arthritis of carpometacarpal [facility]) joint of left thumb 07/11/2021   CHF (congestive heart failure) (HCC)    Heart attack ([place])    Hypertension     Past Surgical History:  Procedure Laterality Date   BACK SURGERY     carpel tunnel surgery     CATARACT EXTRACTION     ROTATOR CUFF REPAIR Left     Social History   Socioeconomic History   Marital status: Divorced    Spouse name: Not on file   Number of children: Not on file   Years of education: Not on file   Highest education level: Not on file  Occupational History   Not on file  Tobacco Use   Smoking status: Never   Smokeless tobacco: Never  Vaping Use   Vaping Use: Never used  Substance and Sexual Activity   Alcohol use: No   Drug use: No   Sexual activity: Not on file  Other Topics Concern   Not on file  Social History Narrative   Not on file   Social Determinants of Health   Financial Resource Strain: Not on file  Food Insecurity: Not on file  Transportation Needs: Not on file  Physical Activity: Not on file  Stress: Not on file  Social Connections: Not on file  Intimate Partner Violence: Not on file    Family History  Problem Relation  Age of Onset   Deep vein thrombosis Mother    Stroke Mother    Hypertension Father    Emphysema Father    Healthy Brother     Allergies  Allergen Reactions   Shellfish Allergy Swelling   Fenofibrate    Meloxicam     Review of Systems  Constitutional: Negative.   HENT: Negative.    Eyes: Negative.   Respiratory: Negative.    Cardiovascular: Negative.   Gastrointestinal: Negative.   Genitourinary: Negative.   Musculoskeletal:  Positive for back pain and joint pain. Negative for falls, myalgias and neck pain.  Skin: Negative.   Neurological: Negative.   Psychiatric/Behavioral: Negative.         Objective:   BP 128/70   Pulse 94   Ht 4\' 11"  (1.499 m)   Wt 172 lb (78 kg)   SpO2 96%   BMI 34.74 kg/m   Vitals:   05/18/22 1351  BP: 128/70  Pulse: 94  Height: 4\' 11"  (1.499 m)  Weight: 172 lb (78 kg)  SpO2: 96%  BMI (Calculated): 34.72    Physical Exam Vitals and nursing note reviewed.  Constitutional:  Appearance: Normal appearance.  HENT:     Head: Normocephalic.     Nose: Nose normal.  Cardiovascular:     Rate and Rhythm: Normal rate and regular rhythm.  Pulmonary:     Effort: Pulmonary effort is normal.     Breath sounds: Normal breath sounds.  Abdominal:     General: Abdomen is flat. Bowel sounds are normal.     Palpations: Abdomen is soft.  Musculoskeletal:        General: Normal range of motion.     Cervical back: Normal range of motion and neck supple.  Neurological:     General: No focal deficit present.     Mental Status: She is alert and oriented to person, place, and time.  Psychiatric:        Mood and Affect: Mood normal.        Behavior: Behavior normal.      No results found for any visits on 05/18/22.  No results found for this or any previous visit (from the past 2160 hour(s)).    Assessment & Plan:  Patient to continue all her current medications.  Labs today. Problem List Items Addressed This Visit     Diabetes ([place])    Relevant Medications   FARXIGA 10 MG TABS tablet   Other Relevant Orders   Hemoglobin A1c   Essential hypertension, benign   Relevant Orders   CMP14+EGFR   CAD (coronary artery disease)   Mixed hyperlipidemia   Relevant Orders   Lipid Panel w/o Chol/HDL Ratio   Thoracic spondylosis with radiculopathy - Primary    Return in about 3 months (around 08/18/2022).   Total time spent: 20 minutes  Perrin Maltese, MD  05/18/2022

## 2022-05-19 ENCOUNTER — Ambulatory Visit
Admission: RE | Admit: 2022-05-19 | Discharge: 2022-05-19 | Disposition: A | Discharge status: Home / Self Care | Payer: Medicare HMO | Source: Ambulatory Visit | Attending: Physical Medicine and Rehabilitation | Admitting: Physical Medicine and Rehabilitation

## 2022-05-19 DIAGNOSIS — M546 Pain in thoracic spine: Secondary | ICD-10-CM

## 2022-05-19 DIAGNOSIS — M549 Dorsalgia, unspecified: Secondary | ICD-10-CM | POA: Diagnosis not present

## 2022-05-19 LAB — CMP14+EGFR
ALT: 13 IU/L (ref 0–32)
AST: 23 IU/L (ref 0–40)
Albumin/Globulin Ratio: 1.8 (ref 1.2–2.2)
Albumin: 4.5 g/dL (ref 3.7–4.7)
Alkaline Phosphatase: 76 IU/L (ref 44–121)
BUN/Creatinine Ratio: 20 (ref 12–28)
BUN: 28 mg/dL — ABNORMAL HIGH (ref 8–27)
Bilirubin Total: 0.3 mg/dL (ref 0.0–1.2)
CO2: 22 mmol/L (ref 20–29)
Calcium: 9.4 mg/dL (ref 8.7–10.3)
Chloride: 105 mmol/L (ref 96–106)
Creatinine, Ser: 1.39 mg/dL — ABNORMAL HIGH (ref 0.57–1.00)
Globulin, Total: 2.5 g/dL (ref 1.5–4.5)
Glucose: 141 mg/dL — ABNORMAL HIGH (ref 70–99)
Potassium: 4.7 mmol/L (ref 3.5–5.2)
Sodium: 143 mmol/L (ref 134–144)
Total Protein: 7 g/dL (ref 6.0–8.5)
eGFR: 37 mL/min/{1.73_m2} — ABNORMAL LOW (ref >59)

## 2022-05-19 LAB — LIPID PANEL W/O CHOL/HDL RATIO
Cholesterol, Total: 216 mg/dL — ABNORMAL HIGH (ref 100–199)
HDL: 44 mg/dL (ref >39)
LDL Chol Calc (NIH): 110 mg/dL — ABNORMAL HIGH (ref 0–99)
Triglycerides: 364 mg/dL — ABNORMAL HIGH (ref 0–149)
VLDL Cholesterol Cal: 62 mg/dL — ABNORMAL HIGH (ref 5–40)

## 2022-05-19 LAB — HEMOGLOBIN A1C
Est. average glucose Bld gHb Est-mCnc: 140 mg/dL
Hgb A1c MFr Bld: 6.5 % — ABNORMAL HIGH (ref 4.8–5.6)

## 2022-05-21 DIAGNOSIS — M549 Dorsalgia, unspecified: Secondary | ICD-10-CM | POA: Diagnosis not present

## 2022-05-21 DIAGNOSIS — M545 Low back pain, unspecified: Secondary | ICD-10-CM | POA: Diagnosis not present

## 2022-05-23 DIAGNOSIS — M19211 Secondary osteoarthritis, right shoulder: Secondary | ICD-10-CM | POA: Diagnosis not present

## 2022-05-29 ENCOUNTER — Other Ambulatory Visit: Payer: Medicare HMO

## 2022-05-30 ENCOUNTER — Other Ambulatory Visit: Payer: Self-pay | Admitting: Internal Medicine

## 2022-05-30 DIAGNOSIS — M4724 Other spondylosis with radiculopathy, thoracic region: Secondary | ICD-10-CM

## 2022-05-31 MED ORDER — PREGABALIN 25 MG PO CAPS: 25.0000 mg | ORAL_CAPSULE | Freq: Two times a day (BID) | ORAL | 3 refills | Status: DC

## 2022-06-07 ENCOUNTER — Ambulatory Visit: Payer: Medicare HMO | Admitting: Cardiovascular Disease

## 2022-06-12 ENCOUNTER — Other Ambulatory Visit: Payer: Self-pay | Admitting: Family

## 2022-06-12 ENCOUNTER — Ambulatory Visit (INDEPENDENT_AMBULATORY_CARE_PROVIDER_SITE_OTHER): Payer: Medicare HMO | Admitting: Family

## 2022-06-12 VITALS — BP 130/72 | HR 78 | Ht 59.0 in | Wt 173.0 lb

## 2022-06-12 DIAGNOSIS — M4724 Other spondylosis with radiculopathy, thoracic region: Secondary | ICD-10-CM

## 2022-06-12 MED ORDER — PREGABALIN 50 MG PO CAPS: 50.0000 mg | ORAL_CAPSULE | Freq: Two times a day (BID) | ORAL | 1 refills | Status: DC

## 2022-06-16 ENCOUNTER — Encounter: Payer: Self-pay | Admitting: Family

## 2022-06-16 DIAGNOSIS — M48061 Spinal stenosis, lumbar region without neurogenic claudication: Secondary | ICD-10-CM | POA: Insufficient documentation

## 2022-06-16 NOTE — Addendum Note (Signed)
Addended by: Grayling Congress on: 06/16/2022 01:16 PM   Modules accepted: Orders

## 2022-06-16 NOTE — Progress Notes (Signed)
   Acute Office Visit  Subjective:     Patient ID: Gail Paul, female    DOB: 01/16/1934, 89 y.o.   MRN: [id number]  Chief Complaint  Patient presents with   Follow-up    Severe mid-back pain    Back Pain This is a chronic problem. The current episode started more than 1 year ago. The problem occurs intermittently. The problem has been waxing and waning since onset. The pain is present in the thoracic spine and lumbar spine. The quality of the pain is described as stabbing and shooting. The pain does not radiate. The pain is at a severity of 10/10. The pain is severe. The symptoms are aggravated by standing, sitting, twisting, position and bending. Stiffness is present All day. She has tried analgesics, bed rest, heat, ice, NSAIDs, muscle relaxant, home exercises and walking for the symptoms. The treatment provided mild relief.     Review of Systems  Musculoskeletal:  Positive for back pain.  All other systems reviewed and are negative.      Objective:    BP 130/72   Pulse 78   Ht 4\' 11"  (1.499 m)   Wt 173 lb (78.5 kg)   SpO2 98%   BMI 34.94 kg/m   Physical Exam Vitals and nursing note reviewed.  Constitutional:      Appearance: Normal appearance. She is normal weight.  HENT:     Head: Normocephalic.  Eyes:     Extraocular Movements: Extraocular movements intact.     Pupils: Pupils are equal, round, and reactive to light.  Cardiovascular:     Rate and Rhythm: Normal rate.  Pulmonary:     Effort: Pulmonary effort is normal.  Musculoskeletal:     Thoracic back: Spasms and tenderness present. Decreased range of motion.       Back:  Neurological:     Mental Status: She is alert.     Gait: Gait abnormal.     No results found for any visits on 06/12/22.      Assessment & Plan:   Problem List Items Addressed This Visit     Thoracic spondylosis with radiculopathy - Primary   Relevant Medications   pregabalin (LYRICA) 50 MG capsule  Increase lyrica  dosing.  Take 2 at night and 1 in the day. Will also see if we can find somewhere for her to get aquatic therapy.   Return as scheduled with Dr. Welton Flakes.  Total time spent: 30 minutes  Miki Kins, FNP  06/12/2022

## 2022-06-18 ENCOUNTER — Telehealth: Payer: Medicare HMO | Admitting: Internal Medicine

## 2022-06-18 ENCOUNTER — Telehealth: Payer: Medicare HMO

## 2022-06-18 ENCOUNTER — Telehealth: Payer: Self-pay

## 2022-06-18 NOTE — Telephone Encounter (Signed)
Pt called and left vm requesting a call back from you regarding issues with her back and asked what dr you were referred her to regarding this because she is miserable & doesn't what to do & just wanted to know the dr's name so she could try & call them. Please advise

## 2022-06-19 NOTE — Addendum Note (Signed)
Addended by: Grayling Congress on: 06/19/2022 09:41 AM   Modules accepted: Orders

## 2022-06-20 ENCOUNTER — Other Ambulatory Visit: Payer: Medicare HMO

## 2022-06-22 ENCOUNTER — Ambulatory Visit (INDEPENDENT_AMBULATORY_CARE_PROVIDER_SITE_OTHER): Payer: Medicare HMO | Admitting: Internal Medicine

## 2022-06-22 ENCOUNTER — Encounter: Payer: Self-pay | Admitting: Internal Medicine

## 2022-06-22 ENCOUNTER — Ambulatory Visit
Admission: RE | Admit: 2022-06-22 | Discharge: 2022-06-22 | Disposition: A | Discharge status: Home / Self Care | Payer: Medicare HMO | Attending: Internal Medicine | Admitting: Internal Medicine

## 2022-06-22 ENCOUNTER — Ambulatory Visit
Admission: RE | Admit: 2022-06-22 | Discharge: 2022-06-22 | Disposition: A | Discharge status: Home / Self Care | Payer: Medicare HMO | Source: Ambulatory Visit | Attending: Internal Medicine | Admitting: Internal Medicine

## 2022-06-22 VITALS — BP 138/82 | HR 82 | Ht 59.0 in | Wt 173.0 lb

## 2022-06-22 DIAGNOSIS — R051 Acute cough: Secondary | ICD-10-CM | POA: Insufficient documentation

## 2022-06-22 DIAGNOSIS — J00 Acute nasopharyngitis [common cold]: Secondary | ICD-10-CM

## 2022-06-22 DIAGNOSIS — I10 Essential (primary) hypertension: Secondary | ICD-10-CM | POA: Diagnosis not present

## 2022-06-22 DIAGNOSIS — N182 Chronic kidney disease, stage 2 (mild): Secondary | ICD-10-CM | POA: Diagnosis not present

## 2022-06-22 DIAGNOSIS — I2510 Atherosclerotic heart disease of native coronary artery without angina pectoris: Secondary | ICD-10-CM

## 2022-06-22 DIAGNOSIS — E1122 Type 2 diabetes mellitus with diabetic chronic kidney disease: Secondary | ICD-10-CM

## 2022-06-22 DIAGNOSIS — Z20822 Contact with and (suspected) exposure to covid-19: Secondary | ICD-10-CM

## 2022-06-22 DIAGNOSIS — R059 Cough, unspecified: Secondary | ICD-10-CM | POA: Diagnosis not present

## 2022-06-22 LAB — POCT XPERT XPRESS SARS COVID-2/FLU/RSV
FLU A: NEGATIVE
FLU B: NEGATIVE
RSV RNA, PCR: NEGATIVE
SARS Coronavirus 2: NEGATIVE

## 2022-06-22 MED ORDER — AMOXICILLIN-POT CLAVULANATE 500-125 MG PO TABS
1.0000 | ORAL_TABLET | Freq: Two times a day (BID) | ORAL | 0 refills | Status: DC
Start: 2022-06-22 — End: 2022-10-08

## 2022-06-22 NOTE — Progress Notes (Signed)
Established Patient Office Visit  Subjective:  Patient ID: Gail Paul, female    DOB: Jan 14, 1934  Age: 89 y.o. MRN: [id number]  Chief Complaint  Patient presents with   Follow-up    Cough, fatigue, sinus congestion, body aches    Patient comes in with a family member today she complains of upper respiratory tract infection for the last 5 days.  She started out with some sore throat but then it moved to her chest.  She is very congested and has a cough which is dry.  She also complains of head and sinus congestion with postnasal drip.  She has been having some chills no fever but body aches. Her COVID/flu/RSV test done in the office is negative.  Suspect that she is developing a bacterial superinfection. Check chest x-ray, and consider an antibiotic.    No other concerns at this time.   Past Medical History:  Diagnosis Date   Arthritis of carpometacarpal [facility]) joint of left thumb 07/11/2021   CHF (congestive heart failure)    Heart attack    Hypertension    Shortness of breath 12/22/2015   Note: Unchanged    Past Surgical History:  Procedure Laterality Date   BACK SURGERY     carpel tunnel surgery     CATARACT EXTRACTION     ROTATOR CUFF REPAIR Left     Social History   Socioeconomic History   Marital status: Divorced    Spouse name: Not on file   Number of children: Not on file   Years of education: Not on file   Highest education level: Not on file  Occupational History   Not on file  Tobacco Use   Smoking status: Never   Smokeless tobacco: Never  Vaping Use   Vaping Use: Never used  Substance and Sexual Activity   Alcohol use: No   Drug use: No   Sexual activity: Not on file  Other Topics Concern   Not on file  Social History Narrative   Not on file   Social Determinants of Health   Financial Resource Strain: Not on file  Food Insecurity: Not on file  Transportation Needs: Not on file  Physical Activity: Not on file  Stress: Not on file   Social Connections: Not on file  Intimate Partner Violence: Not on file    Family History  Problem Relation Age of Onset   Deep vein thrombosis Mother    Stroke Mother    Hypertension Father    Emphysema Father    Healthy Brother     Allergies  Allergen Reactions   Shellfish Allergy Swelling   Fenofibrate    Meloxicam    Tramadol     Review of Systems  Constitutional:  Positive for chills, diaphoresis and malaise/fatigue. Negative for fever.  HENT:  Positive for congestion, ear pain, sinus pain and sore throat.   Eyes: Negative.   Respiratory:  Positive for cough, sputum production and shortness of breath. Negative for wheezing.   Cardiovascular:  Negative for chest pain, palpitations and PND.  Gastrointestinal:  Positive for diarrhea. Negative for constipation, heartburn and nausea.  Genitourinary: Negative.   Musculoskeletal:  Positive for myalgias. Negative for back pain, joint pain and neck pain.  Neurological: Negative.   Psychiatric/Behavioral: Negative.         Objective:   BP 138/82   Pulse 82   Ht  (1.499 m)   Wt 173 lb (78.5 kg)   SpO2 97%   BMI  34.94 kg/m   Vitals:   06/22/22 1142  BP: 138/82  Pulse: 82  Height:  (1.499 m)  Weight: 173 lb (78.5 kg)  SpO2: 97%  BMI (Calculated): 34.92    Physical Exam Vitals and nursing note reviewed.  Constitutional:      Appearance: Normal appearance.  HENT:     Right Ear: Tympanic membrane and ear canal normal.     Left Ear: Tympanic membrane and ear canal normal.     Nose: Congestion and rhinorrhea present.     Mouth/Throat:     Pharynx: No oropharyngeal exudate or posterior oropharyngeal erythema.  Cardiovascular:     Rate and Rhythm: Normal rate and regular rhythm.     Pulses: Normal pulses.     Heart sounds: Normal heart sounds.  Pulmonary:     Effort: Pulmonary effort is normal.     Breath sounds: Normal breath sounds. No wheezing, rhonchi or rales.  Abdominal:     Palpations:  Abdomen is soft.  Musculoskeletal:        General: Normal range of motion.  Neurological:     General: No focal deficit present.     Mental Status: She is alert and oriented to person, place, and time.  Psychiatric:        Mood and Affect: Mood normal.        Behavior: Behavior normal.      No results found for any visits on 06/22/22.  Recent Results (from the past 2160 hour(s))  Lipid Panel w/o Chol/HDL Ratio     Status: Abnormal   Collection Time: 05/18/22  2:21 PM  Result Value Ref Range   Cholesterol, Total 216 (H) 100 - 199 mg/dL   Triglycerides 409 (H) 0 - 149 mg/dL   HDL 44 >81 mg/dL   VLDL Cholesterol Cal 62 (H) 5 - 40 mg/dL   LDL Chol Calc (NIH) 191 (H) 0 - 99 mg/dL  [id number]     Status: Abnormal   Collection Time: 05/18/22  2:21 PM  Result Value Ref Range   Glucose 141 (H) 70 - 99 mg/dL   BUN 28 (H) 8 - 27 mg/dL   Creatinine, Ser 3.08 (H) 0.57 - 1.00 mg/dL   eGFR 37 (L) >65 HQ/ION/6.29   BUN/Creatinine Ratio 20 12 - 28   Sodium 143 134 - 144 mmol/L   Potassium 4.7 3.5 - 5.2 mmol/L   Chloride 105 96 - 106 mmol/L   CO2 22 20 - 29 mmol/L   Calcium 9.4 8.7 - 10.3 mg/dL   Total Protein 7.0 6.0 - 8.5 g/dL   Albumin 4.5 3.7 - 4.7 g/dL   Globulin, Total 2.5 1.5 - 4.5 g/dL   Albumin/Globulin Ratio 1.8 1.2 - 2.2   Bilirubin Total 0.3 0.0 - 1.2 mg/dL   Alkaline Phosphatase 76 44 - 121 IU/L   AST 23 0 - 40 IU/L   ALT 13 0 - 32 IU/L  Hemoglobin A1c     Status: Abnormal   Collection Time: 05/18/22  2:21 PM  Result Value Ref Range   Hgb A1c MFr Bld 6.5 (H) 4.8 - 5.6 %    Comment:          Prediabetes: 5.7 - 6.4          Diabetes: >6.4          Glycemic control for adults with diabetes: <7.0    Est. average glucose Bld gHb Est-mCnc 140 mg/dL      Assessment & Plan:  Chest x-ray is also negative.  Will start Augmentin for this patient.  Rest fluids and Tylenol as well. Problem List Items Addressed This Visit     Diabetes   Essential hypertension, benign   CAD  (coronary artery disease)   Acute cough - Primary   Relevant Orders   DG Chest 2 View (Completed)   POCT XPERT XPRESS SARS COVID-2/FLU/RSV [[id number]]   Acute nasopharyngitis   Relevant Medications   amoxicillin-clavulanate (AUGMENTIN) 500-125 MG tablet    Return in about 10 days (around 07/02/2022).   Total time spent: 30 minutes  Margaretann Loveless, MD  06/22/2022

## 2022-06-25 ENCOUNTER — Ambulatory Visit: Payer: Medicare HMO | Admitting: Cardiovascular Disease

## 2022-06-26 ENCOUNTER — Other Ambulatory Visit: Payer: Medicare HMO

## 2022-06-29 ENCOUNTER — Ambulatory Visit: Payer: Medicare HMO | Admitting: Cardiovascular Disease

## 2022-07-02 ENCOUNTER — Ambulatory Visit (INDEPENDENT_AMBULATORY_CARE_PROVIDER_SITE_OTHER): Payer: Medicare HMO | Admitting: Internal Medicine

## 2022-07-02 ENCOUNTER — Encounter: Payer: Self-pay | Admitting: Internal Medicine

## 2022-07-02 VITALS — BP 122/62 | HR 73 | Ht 59.0 in | Wt 171.8 lb

## 2022-07-02 DIAGNOSIS — I2510 Atherosclerotic heart disease of native coronary artery without angina pectoris: Secondary | ICD-10-CM

## 2022-07-02 DIAGNOSIS — N182 Chronic kidney disease, stage 2 (mild): Secondary | ICD-10-CM | POA: Diagnosis not present

## 2022-07-02 DIAGNOSIS — E1122 Type 2 diabetes mellitus with diabetic chronic kidney disease: Secondary | ICD-10-CM | POA: Diagnosis not present

## 2022-07-02 DIAGNOSIS — E782 Mixed hyperlipidemia: Secondary | ICD-10-CM

## 2022-07-02 DIAGNOSIS — R110 Nausea: Secondary | ICD-10-CM | POA: Insufficient documentation

## 2022-07-02 DIAGNOSIS — M4724 Other spondylosis with radiculopathy, thoracic region: Secondary | ICD-10-CM | POA: Diagnosis not present

## 2022-07-02 DIAGNOSIS — I10 Essential (primary) hypertension: Secondary | ICD-10-CM | POA: Diagnosis not present

## 2022-07-02 MED ORDER — ONDANSETRON HCL 4 MG PO TABS: 4.0000 mg | ORAL_TABLET | Freq: Three times a day (TID) | ORAL | 0 refills | Status: DC | PRN

## 2022-07-02 NOTE — Progress Notes (Signed)
Established Patient Office Visit  Subjective:  Patient ID: Gail Paul, female    DOB: Jul 11, 1933  Age: 89 y.o. MRN: [id number]  Chief Complaint  Patient presents with   Follow-up    10 day follow up    Patient comes in for her follow-up of chest congestion and cough today.  Her chest x-ray was negative and she was given Augmentin p.o.  Today she is feeling much better but has some residual mild dry cough.  She also sounds a little congested but does not have any nasal discharge, no fever, no chills.  Her energy level is still low but she is definitely feeling better than before. Reports of some nausea and her appetite is low.  Patient encouraged to drink more water.  Will send in a prescription for Zofran.  She still has chronic neck and upper back pain.    No other concerns at this time.   Past Medical History:  Diagnosis Date   Arthritis of carpometacarpal [facility]) joint of left thumb 07/11/2021   CHF (congestive heart failure) (HCC)    Heart attack (HCC)    Hypertension    Shortness of breath 12/22/2015   Note: Unchanged    Past Surgical History:  Procedure Laterality Date   BACK SURGERY     carpel tunnel surgery     CATARACT EXTRACTION     ROTATOR CUFF REPAIR Left     Social History   Socioeconomic History   Marital status: Divorced    Spouse name: Not on file   Number of children: Not on file   Years of education: Not on file   Highest education level: Not on file  Occupational History   Not on file  Tobacco Use   Smoking status: Never   Smokeless tobacco: Never  Vaping Use   Vaping Use: Never used  Substance and Sexual Activity   Alcohol use: No   Drug use: No   Sexual activity: Not on file  Other Topics Concern   Not on file  Social History Narrative   Not on file   Social Determinants of Health   Financial Resource Strain: Not on file  Food Insecurity: Not on file  Transportation Needs: Not on file  Physical Activity: Not on file  Stress:  Not on file  Social Connections: Not on file  Intimate Partner Violence: Not on file    Family History  Problem Relation Age of Onset   Deep vein thrombosis Mother    Stroke Mother    Hypertension Father    Emphysema Father    Healthy Brother     Allergies  Allergen Reactions   Shellfish Allergy Swelling   Fenofibrate    Meloxicam    Tramadol     Review of Systems  Constitutional:  Positive for malaise/fatigue. Negative for chills, diaphoresis, fever and weight loss.  HENT:  Positive for congestion. Negative for ear discharge, hearing loss, sinus pain, sore throat and tinnitus.   Eyes: Negative.   Respiratory:  Positive for cough. Negative for sputum production, shortness of breath, wheezing and stridor.   Cardiovascular:  Negative for chest pain, palpitations, orthopnea and claudication.  Gastrointestinal:  Negative for abdominal pain, blood in stool, constipation, diarrhea, heartburn, melena, nausea and vomiting.  Genitourinary:  Negative for dysuria, frequency, hematuria and urgency.  Musculoskeletal:  Positive for back pain, joint pain, myalgias and neck pain.  Skin: Negative.   Neurological: Negative.  Negative for dizziness, tingling, tremors, sensory change, speech change, seizures,  weakness and headaches.  Psychiatric/Behavioral:  Negative for depression, hallucinations, substance abuse and suicidal ideas. The patient is nervous/anxious. The patient does not have insomnia.        Objective:   BP 122/62   Pulse 73   Ht 4\' 11"  (1.499 m)   Wt 171 lb 12.8 oz (77.9 kg)   SpO2 97%   BMI 34.70 kg/m   Vitals:   07/02/22 1335  BP: 122/62  Pulse: 73  Height: 4\' 11"  (1.499 m)  Weight: 171 lb 12.8 oz (77.9 kg)  SpO2: 97%  BMI (Calculated): 34.68    Physical Exam Vitals and nursing note reviewed.  Constitutional:      Appearance: Normal appearance.  HENT:     Head: Normocephalic.  Cardiovascular:     Rate and Rhythm: Normal rate and regular rhythm.      Pulses: Normal pulses.     Heart sounds: Murmur heard.  Pulmonary:     Effort: Pulmonary effort is normal.     Breath sounds: Normal breath sounds.  Abdominal:     General: Abdomen is flat.     Palpations: Abdomen is soft.  Musculoskeletal:        General: Normal range of motion.     Cervical back: Normal range of motion and neck supple. Tenderness present.  Skin:    General: Skin is warm and dry.  Neurological:     General: No focal deficit present.     Mental Status: She is alert and oriented to person, place, and time.  Psychiatric:        Mood and Affect: Mood normal.        Behavior: Behavior normal.      No results found for any visits on 07/02/22.  Recent Results (from the past 2160 hour(s))  Lipid Panel w/o Chol/HDL Ratio     Status: Abnormal   Collection Time: 05/18/22  2:21 PM  Result Value Ref Range   Cholesterol, Total 216 (H) 100 - 199 mg/dL   Triglycerides 161 (H) 0 - 149 mg/dL   HDL 44 >09 mg/dL   VLDL Cholesterol Cal 62 (H) 5 - 40 mg/dL   LDL Chol Calc (NIH) 604 (H) 0 - 99 mg/dL  [id number]     Status: Abnormal   Collection Time: 05/18/22  2:21 PM  Result Value Ref Range   Glucose 141 (H) 70 - 99 mg/dL   BUN 28 (H) 8 - 27 mg/dL   Creatinine, Ser 4.78 (H) 0.57 - 1.00 mg/dL   eGFR 37 (L) >29 FA/OZH/0.86   BUN/Creatinine Ratio 20 12 - 28   Sodium 143 134 - 144 mmol/L   Potassium 4.7 3.5 - 5.2 mmol/L   Chloride 105 96 - 106 mmol/L   CO2 22 20 - 29 mmol/L   Calcium 9.4 8.7 - 10.3 mg/dL   Total Protein 7.0 6.0 - 8.5 g/dL   Albumin 4.5 3.7 - 4.7 g/dL   Globulin, Total 2.5 1.5 - 4.5 g/dL   Albumin/Globulin Ratio 1.8 1.2 - 2.2   Bilirubin Total 0.3 0.0 - 1.2 mg/dL   Alkaline Phosphatase 76 44 - 121 IU/L   AST 23 0 - 40 IU/L   ALT 13 0 - 32 IU/L  Hemoglobin A1c     Status: Abnormal   Collection Time: 05/18/22  2:21 PM  Result Value Ref Range   Hgb A1c MFr Bld 6.5 (H) 4.8 - 5.6 %    Comment:  Prediabetes: 5.7 - 6.4          Diabetes: >6.4           Glycemic control for adults with diabetes: <7.0    Est. average glucose Bld gHb Est-mCnc 140 mg/dL  POCT XPERT XPRESS SARS COVID-2/FLU/RSV [[id number]]     Status: Normal   Collection Time: 06/22/22  5:44 PM  Result Value Ref Range   SARS Coronavirus 2 neg    FLU A Neg    FLU B Neg    RSV RNA, PCR Neg       Assessment & Plan:  Patient advised to continue taking her medications as such.  She can take the Zofran and then encourage p.o. fluids.  Her creatinine was slightly elevated than before.  So adequate p.o. hydration is necessary .  Will repeat her kidney function at follow-up. Problem List Items Addressed This Visit     Diabetes (HCC)   Essential hypertension, benign   CAD (coronary artery disease)   Hyperlipidemia   Thoracic spondylosis with radiculopathy   Nausea - Primary   Relevant Medications   ondansetron (ZOFRAN) 4 MG tablet    Follow up as scheduled.   Total time spent: 25 minutes  Margaretann Loveless, MD  07/02/2022

## 2022-07-04 ENCOUNTER — Telehealth: Payer: Self-pay | Admitting: Internal Medicine

## 2022-07-04 NOTE — Telephone Encounter (Signed)
Patient called back to "do the pharmacy thing" and asked for [facility].

## 2022-07-13 ENCOUNTER — Ambulatory Visit (INDEPENDENT_AMBULATORY_CARE_PROVIDER_SITE_OTHER): Payer: Medicare HMO

## 2022-07-13 DIAGNOSIS — I739 Peripheral vascular disease, unspecified: Secondary | ICD-10-CM

## 2022-07-13 DIAGNOSIS — I10 Essential (primary) hypertension: Secondary | ICD-10-CM

## 2022-07-13 DIAGNOSIS — I340 Nonrheumatic mitral (valve) insufficiency: Secondary | ICD-10-CM

## 2022-07-13 DIAGNOSIS — R0789 Other chest pain: Secondary | ICD-10-CM

## 2022-07-13 DIAGNOSIS — M4724 Other spondylosis with radiculopathy, thoracic region: Secondary | ICD-10-CM

## 2022-07-13 DIAGNOSIS — I2510 Atherosclerotic heart disease of native coronary artery without angina pectoris: Secondary | ICD-10-CM

## 2022-07-15 ENCOUNTER — Other Ambulatory Visit: Payer: Self-pay | Admitting: Internal Medicine

## 2022-07-15 DIAGNOSIS — M654 Radial styloid tenosynovitis [de Quervain]: Secondary | ICD-10-CM

## 2022-07-17 DIAGNOSIS — M5414 Radiculopathy, thoracic region: Secondary | ICD-10-CM | POA: Diagnosis not present

## 2022-07-17 DIAGNOSIS — M546 Pain in thoracic spine: Secondary | ICD-10-CM | POA: Diagnosis not present

## 2022-07-18 ENCOUNTER — Telehealth: Payer: Self-pay

## 2022-07-18 NOTE — Telephone Encounter (Signed)
Pt called and left vm regarding rx pregabalin 50mg , said she went to see the specialist & they recommended to up dose of rx? Pt said pain hasn't really changed, has helped some but asked about upping dose of rx? Please advise

## 2022-07-19 ENCOUNTER — Ambulatory Visit: Payer: Medicare HMO | Admitting: Cardiovascular Disease

## 2022-07-19 ENCOUNTER — Encounter: Payer: Self-pay | Admitting: Cardiovascular Disease

## 2022-07-19 VITALS — BP 118/78 | HR 78 | Ht 62.0 in | Wt 174.0 lb

## 2022-07-19 DIAGNOSIS — E782 Mixed hyperlipidemia: Secondary | ICD-10-CM | POA: Diagnosis not present

## 2022-07-19 DIAGNOSIS — I059 Rheumatic mitral valve disease, unspecified: Secondary | ICD-10-CM

## 2022-07-19 DIAGNOSIS — I2510 Atherosclerotic heart disease of native coronary artery without angina pectoris: Secondary | ICD-10-CM

## 2022-07-19 DIAGNOSIS — I5032 Chronic diastolic (congestive) heart failure: Secondary | ICD-10-CM

## 2022-07-19 DIAGNOSIS — I10 Essential (primary) hypertension: Secondary | ICD-10-CM | POA: Diagnosis not present

## 2022-07-19 NOTE — Assessment & Plan Note (Signed)
Well controlled on recheck. Continue same medications.

## 2022-07-19 NOTE — Assessment & Plan Note (Signed)
Trace MVR on 07/2022 echo

## 2022-07-19 NOTE — Assessment & Plan Note (Signed)
Echo 07/2022 normal EF, grade II DD.Denies chest pain, shortness of breath.

## 2022-07-19 NOTE — Progress Notes (Signed)
Cardiology Office Note   Date:  07/19/2022   ID:  Gail Paul, DOB 05-16-1933, MRN [id number]  PCP:  Margaretann Loveless, MD  Cardiologist:  Adrian Blackwater, MD      History of Present Illness: Gail Paul is a 89 y.o. female who presents for  Chief Complaint  Patient presents with   Follow-up    Echo results    Patient in office to discuss echo results. Denies chest pain, shortness of breath.    Past Medical History:  Diagnosis Date   Arthritis of carpometacarpal [facility]) joint of left thumb 07/11/2021   CHF (congestive heart failure) (HCC)    Heart attack (HCC)    Hypertension    Shortness of breath 12/22/2015   Note: Unchanged     Past Surgical History:  Procedure Laterality Date   BACK SURGERY     carpel tunnel surgery     CATARACT EXTRACTION     ROTATOR CUFF REPAIR Left      Current Outpatient Medications  Medication Sig Dispense Refill   albuterol (VENTOLIN HFA) 108 (90 Base) MCG/ACT inhaler Inhale 2 puffs into the lungs every 6 (six) hours as needed for wheezing or shortness of breath. 8 g 2   amLODipine (NORVASC) 5 MG tablet Take 5 mg by mouth daily.     amoxicillin-clavulanate (AUGMENTIN) 500-125 MG tablet Take 1 tablet by mouth in the morning and at bedtime. 14 tablet 0   aspirin 81 MG tablet Take 81 mg by mouth daily.     busPIRone (BUSPAR) 10 MG tablet Take 10 mg by mouth 2 (two) times daily.      cholecalciferol (VITAMIN D) 1000 UNITS tablet Take 1,000 Units by mouth daily.     clopidogrel (PLAVIX) 75 MG tablet Take 75 mg by mouth daily.     DULoxetine (CYMBALTA) 30 MG capsule Take 30 mg by mouth daily.      FARXIGA 10 MG TABS tablet Take 10 mg by mouth daily.     hydrOXYzine (ATARAX) 10 MG tablet Take 10 mg by mouth 3 (three) times daily as needed.     metoprolol succinate (TOPROL-XL) 25 MG 24 hr tablet Take 25 mg by mouth daily.     Multiple Vitamins-Minerals (CENTRUM SILVER 50+WOMEN PO) Take by mouth daily.     nitroGLYCERIN (NITROSTAT) 0.4 MG SL  tablet Place 0.4 mg under the tongue every 5 (five) minutes as needed.     ondansetron (ZOFRAN) 4 MG tablet Take 1 tablet (4 mg total) by mouth every 8 (eight) hours as needed for nausea or vomiting. 20 tablet 0   OVER THE COUNTER MEDICATION at bedtime. CBD     OVER THE COUNTER MEDICATION daily. BEET ROOT     PREBIOTIC PRODUCT PO Take by mouth daily.     Probiotic Product (PROBIOTIC PO) Take by mouth daily.     rosuvastatin (CRESTOR) 40 MG tablet Take 40 mg by mouth daily.     triamcinolone (KENALOG) 0.025 % cream Apply 1 Application topically 2 (two) times daily.     VASCEPA 1 g capsule Take 2 g by mouth 2 (two) times daily.     No current facility-administered medications for this visit.    Allergies:   Shellfish allergy, Fenofibrate, Meloxicam, and Tramadol    Social History:   reports that she has never smoked. She has never used smokeless tobacco. She reports that she does not drink alcohol and does not use drugs.   Family History:  family  history includes Deep vein thrombosis in her mother; Emphysema in her father; Healthy in her brother; Hypertension in her father; Stroke in her mother.    ROS:     Review of Systems  Constitutional: Negative.   HENT: Negative.    Eyes: Negative.   Respiratory: Negative.    Cardiovascular: Negative.   Gastrointestinal: Negative.   Genitourinary: Negative.   Musculoskeletal:  Positive for back pain.  Skin: Negative.   Neurological: Negative.   Endo/Heme/Allergies: Negative.   Psychiatric/Behavioral: Negative.    All other systems reviewed and are negative.    All other systems are reviewed and negative.    PHYSICAL EXAM: VS:  BP 118/78 (BP Location: Right Arm, Patient Position: Sitting, Cuff Size: Small)   Pulse 78   Ht 5\' 2"  (1.575 m)   Wt 174 lb (78.9 kg)   SpO2 96%   BMI 31.83 kg/m  , BMI Body mass index is 31.83 kg/m. Last weight:  Wt Readings from Last 3 Encounters:  07/19/22 174 lb (78.9 kg)  07/02/22 171 lb 12.8 oz  (77.9 kg)  06/22/22 173 lb (78.5 kg)     Physical Exam Constitutional:      Appearance: Normal appearance.  Cardiovascular:     Rate and Rhythm: Normal rate and regular rhythm.     Heart sounds: Normal heart sounds.  Pulmonary:     Effort: Pulmonary effort is normal.     Breath sounds: Normal breath sounds.  Musculoskeletal:     Right lower leg: No edema.     Left lower leg: No edema.  Neurological:     Mental Status: She is alert.      EKG: none today  Recent Labs: 05/18/2022: ALT 13; BUN 28; Creatinine, Ser 1.39; Potassium 4.7; Sodium 143    Lipid Panel    Component Value Date/Time   CHOL 216 (H) 05/18/2022 1421   CHOL 205 (H) 12/21/2012 1855   TRIG 364 (H) 05/18/2022 1421   TRIG 167 12/21/2012 1855   HDL 44 05/18/2022 1421   HDL 44 12/21/2012 1855   VLDL 33 12/21/2012 1855   LDLCALC 110 (H) 05/18/2022 1421   LDLCALC 128 (H) 12/21/2012 1855     Other studies Reviewed: echo 07/2022   ASSESSMENT AND PLAN:    ICD-10-CM   1. Chronic diastolic congestive heart failure (HCC)  I50.32     2. Coronary artery disease involving native coronary artery of native heart, unspecified whether angina present  I25.10     3. Essential hypertension, benign  I10     4. Mitral valve disorder  I05.9     5. Mixed hyperlipidemia  E78.2        Problem List Items Addressed This Visit       Cardiovascular and Mediastinum   Essential hypertension, benign    Well controlled on recheck. Continue same medications.       CAD (coronary artery disease)   Mitral valve disorder    Trace MVR on 07/2022 echo      Congestive heart failure (HCC) - Primary    Echo 07/2022 normal EF, grade II DD.Denies chest pain, shortness of breath.         Other   Hyperlipidemia    Patient considering back surgery, patient may proceed.  Disposition:   Return in about 4 months (around 11/19/2022).    Total time spent: 30 minutes  Signed,  Adrian Blackwater, MD  07/19/2022 2:00 PM    [facility]

## 2022-07-20 DIAGNOSIS — M5459 Other low back pain: Secondary | ICD-10-CM | POA: Diagnosis not present

## 2022-07-20 DIAGNOSIS — R293 Abnormal posture: Secondary | ICD-10-CM | POA: Diagnosis not present

## 2022-07-20 DIAGNOSIS — R2689 Other abnormalities of gait and mobility: Secondary | ICD-10-CM | POA: Diagnosis not present

## 2022-07-20 DIAGNOSIS — M546 Pain in thoracic spine: Secondary | ICD-10-CM | POA: Diagnosis not present

## 2022-07-25 ENCOUNTER — Telehealth: Payer: Self-pay | Admitting: Internal Medicine

## 2022-07-25 NOTE — Telephone Encounter (Signed)
Patient left VM requesting an increase in her dosage of pregabalin to 75 mg. Please advise if okay.

## 2022-07-26 DIAGNOSIS — M546 Pain in thoracic spine: Secondary | ICD-10-CM | POA: Diagnosis not present

## 2022-07-26 DIAGNOSIS — M5459 Other low back pain: Secondary | ICD-10-CM | POA: Diagnosis not present

## 2022-07-26 DIAGNOSIS — R2689 Other abnormalities of gait and mobility: Secondary | ICD-10-CM | POA: Diagnosis not present

## 2022-07-26 DIAGNOSIS — R293 Abnormal posture: Secondary | ICD-10-CM | POA: Diagnosis not present

## 2022-08-01 DIAGNOSIS — M5459 Other low back pain: Secondary | ICD-10-CM | POA: Diagnosis not present

## 2022-08-01 DIAGNOSIS — M546 Pain in thoracic spine: Secondary | ICD-10-CM | POA: Diagnosis not present

## 2022-08-01 DIAGNOSIS — R293 Abnormal posture: Secondary | ICD-10-CM | POA: Diagnosis not present

## 2022-08-01 DIAGNOSIS — R2689 Other abnormalities of gait and mobility: Secondary | ICD-10-CM | POA: Diagnosis not present

## 2022-08-08 ENCOUNTER — Other Ambulatory Visit: Payer: Self-pay | Admitting: Family

## 2022-08-08 DIAGNOSIS — M546 Pain in thoracic spine: Secondary | ICD-10-CM | POA: Diagnosis not present

## 2022-08-08 DIAGNOSIS — R293 Abnormal posture: Secondary | ICD-10-CM | POA: Diagnosis not present

## 2022-08-08 DIAGNOSIS — R2689 Other abnormalities of gait and mobility: Secondary | ICD-10-CM | POA: Diagnosis not present

## 2022-08-08 DIAGNOSIS — M5459 Other low back pain: Secondary | ICD-10-CM | POA: Diagnosis not present

## 2022-08-08 MED ORDER — PREGABALIN 75 MG PO CAPS: 75.0000 mg | ORAL_CAPSULE | Freq: Two times a day (BID) | ORAL | 0 refills | Status: DC

## 2022-08-10 ENCOUNTER — Other Ambulatory Visit: Payer: Self-pay | Admitting: Internal Medicine

## 2022-08-10 DIAGNOSIS — R2689 Other abnormalities of gait and mobility: Secondary | ICD-10-CM | POA: Diagnosis not present

## 2022-08-10 DIAGNOSIS — M546 Pain in thoracic spine: Secondary | ICD-10-CM | POA: Diagnosis not present

## 2022-08-10 DIAGNOSIS — R293 Abnormal posture: Secondary | ICD-10-CM | POA: Diagnosis not present

## 2022-08-10 DIAGNOSIS — M5459 Other low back pain: Secondary | ICD-10-CM | POA: Diagnosis not present

## 2022-08-14 DIAGNOSIS — M5459 Other low back pain: Secondary | ICD-10-CM | POA: Diagnosis not present

## 2022-08-14 DIAGNOSIS — M546 Pain in thoracic spine: Secondary | ICD-10-CM | POA: Diagnosis not present

## 2022-08-14 DIAGNOSIS — R2689 Other abnormalities of gait and mobility: Secondary | ICD-10-CM | POA: Diagnosis not present

## 2022-08-14 DIAGNOSIS — R293 Abnormal posture: Secondary | ICD-10-CM | POA: Diagnosis not present

## 2022-08-17 ENCOUNTER — Ambulatory Visit: Payer: Medicare HMO | Admitting: Internal Medicine

## 2022-08-17 DIAGNOSIS — M546 Pain in thoracic spine: Secondary | ICD-10-CM | POA: Diagnosis not present

## 2022-08-17 DIAGNOSIS — M5459 Other low back pain: Secondary | ICD-10-CM | POA: Diagnosis not present

## 2022-08-17 DIAGNOSIS — R293 Abnormal posture: Secondary | ICD-10-CM | POA: Diagnosis not present

## 2022-08-17 DIAGNOSIS — R2689 Other abnormalities of gait and mobility: Secondary | ICD-10-CM | POA: Diagnosis not present

## 2022-08-28 DIAGNOSIS — R2689 Other abnormalities of gait and mobility: Secondary | ICD-10-CM | POA: Diagnosis not present

## 2022-08-28 DIAGNOSIS — M546 Pain in thoracic spine: Secondary | ICD-10-CM | POA: Diagnosis not present

## 2022-08-28 DIAGNOSIS — M5459 Other low back pain: Secondary | ICD-10-CM | POA: Diagnosis not present

## 2022-08-28 DIAGNOSIS — R293 Abnormal posture: Secondary | ICD-10-CM | POA: Diagnosis not present

## 2022-09-05 DIAGNOSIS — M5459 Other low back pain: Secondary | ICD-10-CM | POA: Diagnosis not present

## 2022-09-05 DIAGNOSIS — M546 Pain in thoracic spine: Secondary | ICD-10-CM | POA: Diagnosis not present

## 2022-09-05 DIAGNOSIS — R293 Abnormal posture: Secondary | ICD-10-CM | POA: Diagnosis not present

## 2022-09-05 DIAGNOSIS — R2689 Other abnormalities of gait and mobility: Secondary | ICD-10-CM | POA: Diagnosis not present

## 2022-09-07 DIAGNOSIS — M5459 Other low back pain: Secondary | ICD-10-CM | POA: Diagnosis not present

## 2022-09-07 DIAGNOSIS — R293 Abnormal posture: Secondary | ICD-10-CM | POA: Diagnosis not present

## 2022-09-07 DIAGNOSIS — R2689 Other abnormalities of gait and mobility: Secondary | ICD-10-CM | POA: Diagnosis not present

## 2022-09-07 DIAGNOSIS — M546 Pain in thoracic spine: Secondary | ICD-10-CM | POA: Diagnosis not present

## 2022-09-11 DIAGNOSIS — R2689 Other abnormalities of gait and mobility: Secondary | ICD-10-CM | POA: Diagnosis not present

## 2022-09-11 DIAGNOSIS — M546 Pain in thoracic spine: Secondary | ICD-10-CM | POA: Diagnosis not present

## 2022-09-11 DIAGNOSIS — M5459 Other low back pain: Secondary | ICD-10-CM | POA: Diagnosis not present

## 2022-09-11 DIAGNOSIS — R293 Abnormal posture: Secondary | ICD-10-CM | POA: Diagnosis not present

## 2022-09-13 ENCOUNTER — Telehealth: Payer: Self-pay | Admitting: Internal Medicine

## 2022-09-13 ENCOUNTER — Ambulatory Visit (INDEPENDENT_AMBULATORY_CARE_PROVIDER_SITE_OTHER): Payer: Medicare HMO | Admitting: Family

## 2022-09-13 VITALS — BP 130/75 | HR 71 | Ht 62.0 in | Wt 163.0 lb

## 2022-09-13 DIAGNOSIS — E669 Obesity, unspecified: Secondary | ICD-10-CM

## 2022-09-13 DIAGNOSIS — M48061 Spinal stenosis, lumbar region without neurogenic claudication: Secondary | ICD-10-CM

## 2022-09-13 DIAGNOSIS — M4724 Other spondylosis with radiculopathy, thoracic region: Secondary | ICD-10-CM | POA: Diagnosis not present

## 2022-09-13 DIAGNOSIS — M5459 Other low back pain: Secondary | ICD-10-CM | POA: Diagnosis not present

## 2022-09-13 DIAGNOSIS — R293 Abnormal posture: Secondary | ICD-10-CM | POA: Diagnosis not present

## 2022-09-13 DIAGNOSIS — R2689 Other abnormalities of gait and mobility: Secondary | ICD-10-CM | POA: Diagnosis not present

## 2022-09-13 DIAGNOSIS — M546 Pain in thoracic spine: Secondary | ICD-10-CM | POA: Diagnosis not present

## 2022-09-13 NOTE — Telephone Encounter (Signed)
Patient left VM wanting to schedule an appt with Neelam. Please call and schedule.

## 2022-09-18 ENCOUNTER — Ambulatory Visit: Payer: Medicare HMO | Admitting: Internal Medicine

## 2022-09-22 ENCOUNTER — Encounter: Payer: Self-pay | Admitting: Family

## 2022-09-22 NOTE — Progress Notes (Signed)
Established Patient Office Visit  Subjective:  Patient ID: Gail Paul, female    DOB: 1934/01/08  Age: 89 y.o. MRN: [id number]  Chief Complaint  Patient presents with   Acute Visit    Chest/back pain    Patient is here for follow up today.  She needs to know what the numbers for the pain clinic that the ortho referred her to are, she cannot remember what they said, and they called her, but she couldn't talk to them.   No other concerns at this time.   Past Medical History:  Diagnosis Date   Arthritis of carpometacarpal [facility]) joint of left thumb 07/11/2021   CHF (congestive heart failure) (HCC)    Heart attack (HCC)    Hypertension    Shortness of breath 12/22/2015   Note: Unchanged    Past Surgical History:  Procedure Laterality Date   BACK SURGERY     carpel tunnel surgery     CATARACT EXTRACTION     ROTATOR CUFF REPAIR Left     Social History   Socioeconomic History   Marital status: Divorced    Spouse name: Not on file   Number of children: Not on file   Years of education: Not on file   Highest education level: Not on file  Occupational History   Not on file  Tobacco Use   Smoking status: Never   Smokeless tobacco: Never  Vaping Use   Vaping status: Never Used  Substance and Sexual Activity   Alcohol use: No   Drug use: No   Sexual activity: Not on file  Other Topics Concern   Not on file  Social History Narrative   Not on file   Social Determinants of Health   Financial Resource Strain: Not on file  Food Insecurity: Not on file  Transportation Needs: Not on file  Physical Activity: Not on file  Stress: Not on file  Social Connections: Not on file  Intimate Partner Violence: Not on file    Family History  Problem Relation Age of Onset   Deep vein thrombosis Mother    Stroke Mother    Hypertension Father    Emphysema Father    Healthy Brother     Allergies  Allergen Reactions   Shellfish Allergy Swelling   Fenofibrate     Meloxicam    Tramadol     Review of Systems  Musculoskeletal:  Positive for back pain.  All other systems reviewed and are negative.      Objective:   BP 130/75   Pulse 71   Ht 5\' 2"  (1.575 m)   Wt 163 lb (73.9 kg)   SpO2 98%   BMI 29.81 kg/m   Vitals:   09/13/22 1425 09/13/22 1530  BP: (!) 140/78 130/75  Pulse: 84 71  Height: 5\' 2"  (1.575 m)   Weight: 163 lb (73.9 kg)   SpO2: 98% 98%  BMI (Calculated): 29.81     Physical Exam Vitals and nursing note reviewed.  Constitutional:      Appearance: Normal appearance. She is normal weight.  HENT:     Head: Normocephalic.  Eyes:     Pupils: Pupils are equal, round, and reactive to light.  Cardiovascular:     Rate and Rhythm: Normal rate.  Pulmonary:     Effort: Pulmonary effort is normal.  Musculoskeletal:     Thoracic back: Tenderness present.     Lumbar back: Tenderness present. Positive right straight leg raise test and positive  left straight leg raise test.  Neurological:     General: No focal deficit present.     Mental Status: She is alert and oriented to person, place, and time. Mental status is at baseline.  Psychiatric:        Mood and Affect: Mood normal.        Behavior: Behavior normal.        Thought Content: Thought content normal.        Judgment: Judgment normal.      No results found for any visits on 09/13/22.  No results found for this or any previous visit (from the past 2160 hour(s)).     Assessment & Plan:   Problem List Items Addressed This Visit       Active Problems   Obesity (BMI 30-39.9) (Chronic)   Thoracic spondylosis with radiculopathy - Primary    Patient has been referred to the pain clinic at [facility], provided her with phone number to contact.       Spinal stenosis of lumbar region    Patient has been referred to the pain clinic at [facility], provided her with phone number to contact.        Return if symptoms worsen or fail to improve or as scheduled.   Total time  spent: 20 minutes  Miki Kins, FNP  09/13/2022   This document may have been prepared by [facility] Voice Recognition software and as such may include unintentional dictation errors.

## 2022-09-22 NOTE — Assessment & Plan Note (Signed)
Patient has been referred to the pain clinic at [facility], provided her with phone number to contact.

## 2022-09-24 ENCOUNTER — Telehealth: Payer: Medicare HMO

## 2022-09-26 ENCOUNTER — Emergency Department: Payer: Medicare HMO

## 2022-09-26 ENCOUNTER — Inpatient Hospital Stay
Admission: EM | Admit: 2022-09-26 | Discharge: 2022-10-08 | DRG: 565 | Disposition: A | Discharge status: Skilled Nursing Facility | Payer: Medicare HMO | Attending: Hospitalist | Admitting: Hospitalist

## 2022-09-26 DIAGNOSIS — T796XXA Traumatic ischemia of muscle, initial encounter: Principal | ICD-10-CM | POA: Diagnosis present

## 2022-09-26 DIAGNOSIS — E860 Dehydration: Secondary | ICD-10-CM | POA: Diagnosis present

## 2022-09-26 DIAGNOSIS — W19XXXA Unspecified fall, initial encounter: Secondary | ICD-10-CM | POA: Diagnosis present

## 2022-09-26 DIAGNOSIS — M6282 Rhabdomyolysis: Secondary | ICD-10-CM | POA: Diagnosis not present

## 2022-09-26 DIAGNOSIS — F419 Anxiety disorder, unspecified: Secondary | ICD-10-CM | POA: Diagnosis not present

## 2022-09-26 DIAGNOSIS — N179 Acute kidney failure, unspecified: Secondary | ICD-10-CM | POA: Diagnosis present

## 2022-09-26 DIAGNOSIS — M6281 Muscle weakness (generalized): Secondary | ICD-10-CM | POA: Diagnosis not present

## 2022-09-26 DIAGNOSIS — Z885 Allergy status to narcotic agent status: Secondary | ICD-10-CM | POA: Diagnosis not present

## 2022-09-26 DIAGNOSIS — R41841 Cognitive communication deficit: Secondary | ICD-10-CM | POA: Diagnosis not present

## 2022-09-26 DIAGNOSIS — I110 Hypertensive heart disease with heart failure: Secondary | ICD-10-CM | POA: Diagnosis present

## 2022-09-26 DIAGNOSIS — R778 Other specified abnormalities of plasma proteins: Secondary | ICD-10-CM | POA: Diagnosis not present

## 2022-09-26 DIAGNOSIS — I252 Old myocardial infarction: Secondary | ICD-10-CM | POA: Diagnosis not present

## 2022-09-26 DIAGNOSIS — Z9181 History of falling: Secondary | ICD-10-CM | POA: Diagnosis not present

## 2022-09-26 DIAGNOSIS — S4992XA Unspecified injury of left shoulder and upper arm, initial encounter: Secondary | ICD-10-CM | POA: Diagnosis not present

## 2022-09-26 DIAGNOSIS — I959 Hypotension, unspecified: Secondary | ICD-10-CM | POA: Diagnosis not present

## 2022-09-26 DIAGNOSIS — R531 Weakness: Secondary | ICD-10-CM | POA: Diagnosis not present

## 2022-09-26 DIAGNOSIS — Z66 Do not resuscitate: Secondary | ICD-10-CM | POA: Diagnosis not present

## 2022-09-26 DIAGNOSIS — Z7982 Long term (current) use of aspirin: Secondary | ICD-10-CM

## 2022-09-26 DIAGNOSIS — S59912A Unspecified injury of left forearm, initial encounter: Secondary | ICD-10-CM | POA: Diagnosis not present

## 2022-09-26 DIAGNOSIS — R1311 Dysphagia, oral phase: Secondary | ICD-10-CM | POA: Diagnosis not present

## 2022-09-26 DIAGNOSIS — Z23 Encounter for immunization: Secondary | ICD-10-CM

## 2022-09-26 DIAGNOSIS — R220 Localized swelling, mass and lump, head: Secondary | ICD-10-CM | POA: Diagnosis not present

## 2022-09-26 DIAGNOSIS — F32A Depression, unspecified: Secondary | ICD-10-CM | POA: Diagnosis not present

## 2022-09-26 DIAGNOSIS — I2510 Atherosclerotic heart disease of native coronary artery without angina pectoris: Secondary | ICD-10-CM | POA: Diagnosis present

## 2022-09-26 DIAGNOSIS — E1142 Type 2 diabetes mellitus with diabetic polyneuropathy: Secondary | ICD-10-CM

## 2022-09-26 DIAGNOSIS — S0081XA Abrasion of other part of head, initial encounter: Secondary | ICD-10-CM | POA: Diagnosis present

## 2022-09-26 DIAGNOSIS — S199XXA Unspecified injury of neck, initial encounter: Secondary | ICD-10-CM | POA: Diagnosis not present

## 2022-09-26 DIAGNOSIS — R7989 Other specified abnormal findings of blood chemistry: Secondary | ICD-10-CM

## 2022-09-26 DIAGNOSIS — Z888 Allergy status to other drugs, medicaments and biological substances status: Secondary | ICD-10-CM

## 2022-09-26 DIAGNOSIS — Z0389 Encounter for observation for other suspected diseases and conditions ruled out: Secondary | ICD-10-CM | POA: Diagnosis not present

## 2022-09-26 DIAGNOSIS — Z8249 Family history of ischemic heart disease and other diseases of the circulatory system: Secondary | ICD-10-CM | POA: Diagnosis not present

## 2022-09-26 DIAGNOSIS — E785 Hyperlipidemia, unspecified: Secondary | ICD-10-CM | POA: Diagnosis present

## 2022-09-26 DIAGNOSIS — T796XXD Traumatic ischemia of muscle, subsequent encounter: Secondary | ICD-10-CM | POA: Diagnosis not present

## 2022-09-26 DIAGNOSIS — Y92009 Unspecified place in unspecified non-institutional (private) residence as the place of occurrence of the external cause: Secondary | ICD-10-CM

## 2022-09-26 DIAGNOSIS — Z825 Family history of asthma and other chronic lower respiratory diseases: Secondary | ICD-10-CM | POA: Diagnosis not present

## 2022-09-26 DIAGNOSIS — M19011 Primary osteoarthritis, right shoulder: Secondary | ICD-10-CM | POA: Diagnosis not present

## 2022-09-26 DIAGNOSIS — I509 Heart failure, unspecified: Secondary | ICD-10-CM

## 2022-09-26 DIAGNOSIS — I10 Essential (primary) hypertension: Secondary | ICD-10-CM

## 2022-09-26 DIAGNOSIS — I5032 Chronic diastolic (congestive) heart failure: Secondary | ICD-10-CM | POA: Diagnosis present

## 2022-09-26 DIAGNOSIS — Z7902 Long term (current) use of antithrombotics/antiplatelets: Secondary | ICD-10-CM | POA: Diagnosis not present

## 2022-09-26 DIAGNOSIS — Z79899 Other long term (current) drug therapy: Secondary | ICD-10-CM

## 2022-09-26 DIAGNOSIS — R269 Unspecified abnormalities of gait and mobility: Secondary | ICD-10-CM | POA: Diagnosis not present

## 2022-09-26 DIAGNOSIS — Z91013 Allergy to seafood: Secondary | ICD-10-CM

## 2022-09-26 DIAGNOSIS — N189 Chronic kidney disease, unspecified: Secondary | ICD-10-CM

## 2022-09-26 DIAGNOSIS — M79601 Pain in right arm: Secondary | ICD-10-CM | POA: Diagnosis not present

## 2022-09-26 DIAGNOSIS — R609 Edema, unspecified: Secondary | ICD-10-CM | POA: Diagnosis not present

## 2022-09-26 DIAGNOSIS — R4182 Altered mental status, unspecified: Secondary | ICD-10-CM

## 2022-09-26 DIAGNOSIS — R2689 Other abnormalities of gait and mobility: Secondary | ICD-10-CM | POA: Diagnosis not present

## 2022-09-26 DIAGNOSIS — S0993XA Unspecified injury of face, initial encounter: Secondary | ICD-10-CM | POA: Diagnosis not present

## 2022-09-26 DIAGNOSIS — Z741 Need for assistance with personal care: Secondary | ICD-10-CM | POA: Diagnosis not present

## 2022-09-26 DIAGNOSIS — M5030 Other cervical disc degeneration, unspecified cervical region: Secondary | ICD-10-CM | POA: Diagnosis not present

## 2022-09-26 DIAGNOSIS — I2489 Other forms of acute ischemic heart disease: Secondary | ICD-10-CM | POA: Diagnosis present

## 2022-09-26 DIAGNOSIS — Z823 Family history of stroke: Secondary | ICD-10-CM | POA: Diagnosis not present

## 2022-09-26 DIAGNOSIS — S6992XA Unspecified injury of left wrist, hand and finger(s), initial encounter: Secondary | ICD-10-CM | POA: Diagnosis not present

## 2022-09-26 DIAGNOSIS — M79621 Pain in right upper arm: Secondary | ICD-10-CM | POA: Diagnosis not present

## 2022-09-26 DIAGNOSIS — M85822 Other specified disorders of bone density and structure, left upper arm: Secondary | ICD-10-CM | POA: Diagnosis not present

## 2022-09-26 DIAGNOSIS — Z9849 Cataract extraction status, unspecified eye: Secondary | ICD-10-CM | POA: Diagnosis not present

## 2022-09-26 DIAGNOSIS — M1811 Unilateral primary osteoarthritis of first carpometacarpal joint, right hand: Secondary | ICD-10-CM | POA: Diagnosis not present

## 2022-09-26 DIAGNOSIS — R2681 Unsteadiness on feet: Secondary | ICD-10-CM | POA: Diagnosis not present

## 2022-09-26 DIAGNOSIS — S0990XA Unspecified injury of head, initial encounter: Secondary | ICD-10-CM | POA: Diagnosis not present

## 2022-09-26 DIAGNOSIS — M25531 Pain in right wrist: Secondary | ICD-10-CM | POA: Diagnosis not present

## 2022-09-26 LAB — CBC WITH DIFFERENTIAL/PLATELET
Abs Immature Granulocytes: 0.02 10*3/uL (ref 0.00–0.07)
Basophils Absolute: 0 10*3/uL (ref 0.0–0.1)
Basophils Relative: 0 %
Eosinophils Absolute: 0 10*3/uL (ref 0.0–0.5)
Eosinophils Relative: 0 %
HCT: 44.4 % (ref 36.0–46.0)
Hemoglobin: 14.6 g/dL (ref 12.0–15.0)
Immature Granulocytes: 0 %
Lymphocytes Relative: 9 %
Lymphs Abs: 0.8 10*3/uL (ref 0.7–4.0)
MCH: 29.9 pg (ref 26.0–34.0)
MCHC: 32.9 g/dL (ref 30.0–36.0)
MCV: 91 fL (ref 80.0–100.0)
Monocytes Absolute: 0.7 10*3/uL (ref 0.1–1.0)
Monocytes Relative: 8 %
Neutro Abs: 7.6 10*3/uL (ref 1.7–7.7)
Neutrophils Relative %: 83 %
Platelets: 173 10*3/uL (ref 150–400)
RBC: 4.88 MIL/uL (ref 3.87–5.11)
RDW: 12.6 % (ref 11.5–15.5)
WBC: 9.1 10*3/uL (ref 4.0–10.5)
nRBC: 0 % (ref 0.0–0.2)

## 2022-09-26 LAB — COMPREHENSIVE METABOLIC PANEL
ALT: 32 U/L (ref 0–44)
AST: 71 U/L — ABNORMAL HIGH (ref 15–41)
Albumin: 4.1 g/dL (ref 3.5–5.0)
Alkaline Phosphatase: 59 U/L (ref 38–126)
Anion gap: 13 (ref 5–15)
BUN: 67 mg/dL — ABNORMAL HIGH (ref 8–23)
CO2: 21 mmol/L — ABNORMAL LOW (ref 22–32)
Calcium: 9.4 mg/dL (ref 8.9–10.3)
Chloride: 111 mmol/L (ref 98–111)
Creatinine, Ser: 1.71 mg/dL — ABNORMAL HIGH (ref 0.44–1.00)
GFR, Estimated: 29 mL/min — ABNORMAL LOW (ref >60)
Glucose, Bld: 114 mg/dL — ABNORMAL HIGH (ref 70–99)
Potassium: 3.9 mmol/L (ref 3.5–5.1)
Sodium: 145 mmol/L (ref 135–145)
Total Bilirubin: 1.5 mg/dL — ABNORMAL HIGH (ref 0.3–1.2)
Total Protein: 7.4 g/dL (ref 6.5–8.1)

## 2022-09-26 LAB — APTT: aPTT: 27 seconds (ref 24–36)

## 2022-09-26 LAB — PROTIME-INR
INR: 1.3 — ABNORMAL HIGH (ref 0.8–1.2)
Prothrombin Time: 15.9 seconds — ABNORMAL HIGH (ref 11.4–15.2)

## 2022-09-26 LAB — LACTIC ACID, PLASMA: Lactic Acid, Venous: 2.4 mmol/L (ref 0.5–1.9)

## 2022-09-26 LAB — CBG MONITORING, ED: Glucose-Capillary: 119 mg/dL — ABNORMAL HIGH (ref 70–99)

## 2022-09-26 LAB — TROPONIN I (HIGH SENSITIVITY): Troponin I (High Sensitivity): 979 ng/L (ref <18)

## 2022-09-26 LAB — CK: Total CK: 1607 U/L — ABNORMAL HIGH (ref 38–234)

## 2022-09-26 MED ORDER — MAGNESIUM HYDROXIDE 400 MG/5ML PO SUSP: 30.0000 mL | Freq: Every day | ORAL | Status: DC | PRN

## 2022-09-26 MED ORDER — SODIUM CHLORIDE 0.9 % IV SOLN: INTRAVENOUS | Status: DC

## 2022-09-26 MED ORDER — MORPHINE SULFATE (PF) 2 MG/ML IV SOLN
2.0000 mg | INTRAVENOUS | Status: DC | PRN
  Administered 2022-09-30: 2 mg via INTRAVENOUS
  Filled 2022-09-26: qty 1

## 2022-09-26 MED ORDER — LACTATED RINGERS IV BOLUS (SEPSIS)
1000.0000 mL | Freq: Once | INTRAVENOUS | Status: AC
  Administered 2022-09-26: 1000 mL via INTRAVENOUS

## 2022-09-26 MED ORDER — SODIUM CHLORIDE 0.9 % IV BOLUS
1000.0000 mL | Freq: Once | INTRAVENOUS | Status: AC
  Administered 2022-09-26: 1000 mL via INTRAVENOUS

## 2022-09-26 MED ORDER — ICOSAPENT ETHYL 1 G PO CAPS
2.0000 g | ORAL_CAPSULE | Freq: Two times a day (BID) | ORAL | Status: DC
  Administered 2022-09-27 – 2022-10-08 (×23): 2 g via ORAL
  Filled 2022-09-26 (×23): qty 2

## 2022-09-26 MED ORDER — RISAQUAD PO CAPS
1.0000 | ORAL_CAPSULE | Freq: Every day | ORAL | Status: DC
  Administered 2022-09-27 – 2022-10-08 (×12): 1 via ORAL
  Filled 2022-09-26 (×12): qty 1

## 2022-09-26 MED ORDER — ASPIRIN 81 MG PO TBEC
81.0000 mg | DELAYED_RELEASE_TABLET | Freq: Every day | ORAL | Status: DC
  Administered 2022-09-27 – 2022-10-08 (×12): 81 mg via ORAL
  Filled 2022-09-26 (×12): qty 1

## 2022-09-26 MED ORDER — ROSUVASTATIN CALCIUM 20 MG PO TABS: 40.0000 mg | ORAL_TABLET | Freq: Every day | ORAL | Status: DC

## 2022-09-26 MED ORDER — CLOPIDOGREL BISULFATE 75 MG PO TABS
75.0000 mg | ORAL_TABLET | Freq: Every day | ORAL | Status: DC
  Administered 2022-09-27 – 2022-10-08 (×12): 75 mg via ORAL
  Filled 2022-09-26 (×12): qty 1

## 2022-09-26 MED ORDER — ALBUTEROL SULFATE (2.5 MG/3ML) 0.083% IN NEBU: 2.5000 mg | INHALATION_SOLUTION | Freq: Four times a day (QID) | RESPIRATORY_TRACT | Status: DC | PRN

## 2022-09-26 MED ORDER — BUSPIRONE HCL 10 MG PO TABS
10.0000 mg | ORAL_TABLET | Freq: Two times a day (BID) | ORAL | Status: DC
  Administered 2022-09-27 – 2022-10-08 (×23): 10 mg via ORAL
  Filled 2022-09-26 (×4): qty 2
  Filled 2022-09-26: qty 1
  Filled 2022-09-26: qty 2
  Filled 2022-09-26 (×2): qty 1
  Filled 2022-09-26: qty 2
  Filled 2022-09-26: qty 1
  Filled 2022-09-26: qty 2
  Filled 2022-09-26: qty 1
  Filled 2022-09-26 (×3): qty 2
  Filled 2022-09-26: qty 1
  Filled 2022-09-26 (×8): qty 2

## 2022-09-26 MED ORDER — AMLODIPINE BESYLATE 5 MG PO TABS
5.0000 mg | ORAL_TABLET | Freq: Every day | ORAL | Status: DC
  Administered 2022-09-27 – 2022-10-08 (×12): 5 mg via ORAL
  Filled 2022-09-26 (×12): qty 1

## 2022-09-26 MED ORDER — ADULT MULTIVITAMIN W/MINERALS CH
1.0000 | ORAL_TABLET | Freq: Every day | ORAL | Status: DC
  Administered 2022-09-27 – 2022-10-08 (×12): 1 via ORAL
  Filled 2022-09-26 (×12): qty 1

## 2022-09-26 MED ORDER — ACETAMINOPHEN 325 MG PO TABS
650.0000 mg | ORAL_TABLET | Freq: Four times a day (QID) | ORAL | Status: DC | PRN
  Administered 2022-09-28 – 2022-10-01 (×3): 650 mg via ORAL
  Filled 2022-09-26 (×3): qty 2

## 2022-09-26 MED ORDER — HYDROXYZINE HCL 10 MG PO TABS
10.0000 mg | ORAL_TABLET | Freq: Two times a day (BID) | ORAL | Status: DC
  Administered 2022-09-27 – 2022-10-08 (×23): 10 mg via ORAL
  Filled 2022-09-26 (×23): qty 1

## 2022-09-26 MED ORDER — ACETAMINOPHEN 650 MG RE SUPP: 650.0000 mg | Freq: Four times a day (QID) | RECTAL | Status: DC | PRN

## 2022-09-26 MED ORDER — ONDANSETRON HCL 4 MG PO TABS: 4.0000 mg | ORAL_TABLET | Freq: Three times a day (TID) | ORAL | Status: DC | PRN

## 2022-09-26 MED ORDER — METOPROLOL SUCCINATE ER 25 MG PO TB24
25.0000 mg | ORAL_TABLET | Freq: Every day | ORAL | Status: DC
  Administered 2022-09-27 – 2022-10-07 (×9): 25 mg via ORAL
  Filled 2022-09-26 (×12): qty 1

## 2022-09-26 MED ORDER — PREGABALIN 75 MG PO CAPS
75.0000 mg | ORAL_CAPSULE | Freq: Two times a day (BID) | ORAL | Status: DC
  Administered 2022-09-27 – 2022-10-08 (×23): 75 mg via ORAL
  Filled 2022-09-26 (×23): qty 1

## 2022-09-26 MED ORDER — DULOXETINE HCL 30 MG PO CPEP
30.0000 mg | ORAL_CAPSULE | Freq: Every day | ORAL | Status: DC
  Administered 2022-09-27 – 2022-10-08 (×12): 30 mg via ORAL
  Filled 2022-09-26 (×12): qty 1

## 2022-09-26 MED ORDER — ALBUTEROL SULFATE HFA 108 (90 BASE) MCG/ACT IN AERS: 2.0000 | INHALATION_SPRAY | Freq: Four times a day (QID) | RESPIRATORY_TRACT | Status: DC | PRN

## 2022-09-26 MED ORDER — ONDANSETRON HCL 4 MG/2ML IJ SOLN: 4.0000 mg | Freq: Four times a day (QID) | INTRAMUSCULAR | Status: DC | PRN

## 2022-09-26 MED ORDER — ONDANSETRON HCL 4 MG PO TABS: 4.0000 mg | ORAL_TABLET | Freq: Four times a day (QID) | ORAL | Status: DC | PRN

## 2022-09-26 MED ORDER — DAPAGLIFLOZIN PROPANEDIOL 10 MG PO TABS: 10.0000 mg | ORAL_TABLET | Freq: Every day | ORAL | Status: DC

## 2022-09-26 MED ORDER — TRIAMCINOLONE ACETONIDE 0.025 % EX CREA: 1.0000 | TOPICAL_CREAM | Freq: Two times a day (BID) | CUTANEOUS | Status: DC | PRN

## 2022-09-26 MED ORDER — ENOXAPARIN SODIUM 30 MG/0.3ML IJ SOSY
30.0000 mg | PREFILLED_SYRINGE | INTRAMUSCULAR | Status: DC
  Administered 2022-09-27 – 2022-10-08 (×12): 30 mg via SUBCUTANEOUS
  Filled 2022-09-26 (×12): qty 0.3

## 2022-09-26 MED ORDER — VITAMIN D 25 MCG (1000 UNIT) PO TABS
1000.0000 [IU] | ORAL_TABLET | Freq: Every day | ORAL | Status: DC
  Administered 2022-09-27 – 2022-10-08 (×12): 1000 [IU] via ORAL
  Filled 2022-09-26 (×12): qty 1

## 2022-09-26 MED ORDER — TRAZODONE HCL 50 MG PO TABS
25.0000 mg | ORAL_TABLET | Freq: Every evening | ORAL | Status: DC | PRN
  Administered 2022-09-27 – 2022-10-07 (×5): 25 mg via ORAL
  Filled 2022-09-26 (×5): qty 1

## 2022-09-26 MED ORDER — NITROGLYCERIN 0.4 MG SL SUBL: 0.4000 mg | SUBLINGUAL_TABLET | SUBLINGUAL | Status: DC | PRN

## 2022-09-26 NOTE — Assessment & Plan Note (Signed)
-   We will continue Lyrica. 

## 2022-09-26 NOTE — Assessment & Plan Note (Addendum)
-   While this could be associated with acute rhabdomyolysis, ACS will be considered in the differential diagnoses that could have led to her syncope and fall. - She denies any chest pain. - We will still follow serial troponins. - We will continue beta-blocker therapy with Toprol-XL as well as Plavix and aspirin. - Will obtain a 2D echo for assessment of this elevated troponin as well as possible syncope. - Cardiology consult can be considered in a.m.

## 2022-09-26 NOTE — H&P (Signed)
[place]   PATIENT NAME: Gail Paul    MR#:  [id number]  DATE OF BIRTH:  06-Feb-1934  DATE OF ADMISSION:  09/26/2022  PRIMARY CARE PHYSICIAN: Margaretann Loveless, MD   Patient is coming from: Home  REQUESTING/REFERRING PHYSICIAN: Corena Herter, MD  CHIEF COMPLAINT:   Chief Complaint  Patient presents with   Fall    HISTORY OF PRESENT ILLNESS:  Gail Paul is a 89 y.o. Caucasian female with medical history significant for CHF, coronary artery disease, hypertension and osteoarthritis, who presented to the emergency room with acute onset of fall.  The patient was last heard from 3 days ago.  She lives alone.  Her friend was checking on her today and found her facedown on the ground.  She was complaining of pain in the upper back and the right side of her face.  She does not recall a fall or why she ended up on the ground.  She was not oriented to the day but was oriented to person, place and year.  No reported chest pain or palpitations.  No report cough or wheezing or hemoptysis.  No reported abdominal pain or nausea or vomiting or diarrhea.  No dysuria, oliguria, urinary frequency or urgency or flank pain.  The patient was fairly asleep during my interview no further history could be obtained.   ED Course: When the patient came to the ER, BP was 148/82 with otherwise normal vital signs.  Labs revealed BUN of 67 and creatinine 1.71 and a CO2 of 21, AST of 71 and total bili of 1.5.  Total CK was 1607 and high sensitive troponin I was 979.  CBC was within normal.  Blood culture was sent. EKG as reviewed by me : EKG showed normal sinus rhythm with a rate of 89 with low voltage QRS. Imaging: Noncontrasted CT scan showed chronic microvascular disease and atrophy with no acute intracranial normalities.  Maxillofacial CT showed soft tissue swelling in the right face with no other facial or orbital fractures.  C-spine CT showed degenerative disc and facet disease with no fractures.   Portable chest x-ray showed no acute cardiopulmonary disease.  Left forearm x-ray showed osteopenia and degenerative changes with no acute fracture or dislocation.  Left humerus x-ray showed no acute fracture or dislocation.  Showed osteopenia and generative changes.  And left wrist x-ray was unremarkable.   The patient was given 1 L bolus of IV lactated Ringer and 1 L of IV normal saline.  She will be admitted to a cardiac telemetry bed for further evaluation and management.   PAST MEDICAL HISTORY:   Past Medical History:  Diagnosis Date   Arthritis of carpometacarpal [facility]) joint of left thumb 07/11/2021   CHF (congestive heart failure) (HCC)    Heart attack (HCC)    Hypertension    Shortness of breath 12/22/2015   Note: Unchanged    PAST SURGICAL HISTORY:   Past Surgical History:  Procedure Laterality Date   BACK SURGERY     carpel tunnel surgery     CATARACT EXTRACTION     ROTATOR CUFF REPAIR Left     SOCIAL HISTORY:   Social History   Tobacco Use   Smoking status: Never   Smokeless tobacco: Never  Substance Use Topics   Alcohol use: No    FAMILY HISTORY:   Family History  Problem Relation Age of Onset   Deep vein thrombosis Mother    Stroke Mother    Hypertension  Father    Emphysema Father    Healthy Brother     DRUG ALLERGIES:   Allergies  Allergen Reactions   Shellfish Allergy Swelling   Fenofibrate    Meloxicam    Tramadol     REVIEW OF SYSTEMS:   ROS As per history of present illness, otherwise no further history could be obtained due to the patient's altered mental status.   MEDICATIONS AT HOME:   Prior to Admission medications   Medication Sig Start Date End Date Taking? Authorizing Provider  pregabalin (LYRICA) 75 MG capsule Take 1 capsule (75 mg total) by mouth 2 (two) times daily. 08/08/22   Miki Kins, FNP  albuterol (VENTOLIN HFA) 108 (90 Base) MCG/ACT inhaler Inhale 2 puffs into the lungs every 6 (six) hours as needed for  wheezing or shortness of breath. 05/04/22   Margaretann Loveless, MD  amLODipine (NORVASC) 5 MG tablet Take 5 mg by mouth daily. 10/26/19   [provider]  amoxicillin-clavulanate (AUGMENTIN) 500-125 MG tablet Take 1 tablet by mouth in the morning and at bedtime. 06/22/22   Margaretann Loveless, MD  aspirin 81 MG tablet Take 81 mg by mouth daily.    [provider]  busPIRone (BUSPAR) 10 MG tablet Take 10 mg by mouth 2 (two) times daily.     [provider]  cholecalciferol (VITAMIN D) 1000 UNITS tablet Take 1,000 Units by mouth daily.    [provider]  clopidogrel (PLAVIX) 75 MG tablet Take 75 mg by mouth daily.    [provider]  DULoxetine (CYMBALTA) 30 MG capsule Take 30 mg by mouth daily.  09/07/18   [provider]  FARXIGA 10 MG TABS tablet Take 10 mg by mouth daily. 02/01/22   [provider]  hydrOXYzine (ATARAX) 10 MG tablet TAKE 1 TABLET BY MOUTH TWICE A DAY 08/10/22   Margaretann Loveless, MD  metoprolol succinate (TOPROL-XL) 25 MG 24 hr tablet Take 25 mg by mouth daily. 09/18/19   [provider]  Multiple Vitamins-Minerals (CENTRUM SILVER 50+WOMEN PO) Take by mouth daily.    [provider]  nitroGLYCERIN (NITROSTAT) 0.4 MG SL tablet Place 0.4 mg under the tongue every 5 (five) minutes as needed.    [provider]  ondansetron (ZOFRAN) 4 MG tablet Take 1 tablet (4 mg total) by mouth every 8 (eight) hours as needed for nausea or vomiting. 07/02/22   Margaretann Loveless, MD  OVER THE COUNTER MEDICATION at bedtime. CBD    [provider]  OVER THE COUNTER MEDICATION daily. BEET ROOT    [provider]  PREBIOTIC PRODUCT PO Take by mouth daily.    [provider]  Probiotic Product (PROBIOTIC PO) Take by mouth daily.    [provider]  rosuvastatin (CRESTOR) 40 MG tablet Take 40 mg by mouth daily.    [provider]  triamcinolone (KENALOG) 0.025 % cream Apply 1 Application  topically 2 (two) times daily. 04/14/22   [provider]  VASCEPA 1 g capsule Take 2 g by mouth 2 (two) times daily.    [provider]      VITAL SIGNS:  Blood pressure (!) 159/72, pulse 61, temperature 98.4 F (36.9 C), temperature source Oral, resp. rate 16, weight 71.4 kg, SpO2 100%.  PHYSICAL EXAMINATION:  Physical Exam  GENERAL:  89 y.o.-year-old Caucasian female patient lying in the bed with no acute distress.  EYES: Pupils equal, round, reactive to light and accommodation. No  scleral icterus. Extraocular muscles intact.  HEENT: Head atraumatic, normocephalic. Oropharynx and nasopharynx clear.  NECK:  Supple, no jugular venous distention. No thyroid enlargement, no tenderness.  LUNGS: Normal breath sounds bilaterally, no wheezing, rales,rhonchi or crepitation. No use of accessory muscles of respiration.  CARDIOVASCULAR: Regular rate and rhythm, S1, S2 normal. No murmurs, rubs, or gallops.  ABDOMEN: Soft, nondistended, nontender. Bowel sounds present. No organomegaly or mass.  EXTREMITIES: No pedal edema, cyanosis, or clubbing.  NEUROLOGIC: Cranial nerves II through XII are intact. Muscle strength 5/5 in all extremities. Sensation intact. Gait not checked.  PSYCHIATRIC: The patient is alert and oriented x 3.  Normal affect and good eye contact. SKIN: No obvious rash, lesion, or ulcer.   LABORATORY PANEL:   CBC Recent Labs  Lab 09/26/22 2140  WBC 9.1  HGB 14.6  HCT 44.4  PLT 173   ------------------------------------------------------------------------------------------------------------------  Chemistries  Recent Labs  Lab 09/26/22 2140  NA 145  K 3.9  CL 111  CO2 21*  GLUCOSE 114*  BUN 67*  CREATININE 1.71*  CALCIUM 9.4  AST 71*  ALT 32  ALKPHOS 59  BILITOT 1.5*   ------------------------------------------------------------------------------------------------------------------  Cardiac Enzymes No results for input(s): "TROPONINI" in  the last 168 hours. ------------------------------------------------------------------------------------------------------------------  RADIOLOGY:  DG Humerus Left  Result Date: 09/26/2022 CLINICAL DATA:  Fall and trauma to the left upper extremity. EXAM: LEFT HUMERUS - 2+ VIEW; LEFT WRIST - COMPLETE 3+ VIEW; LEFT FOREARM - 2 VIEW COMPARISON:  None Available. FINDINGS: There is no acute fracture or dislocation. The bones are osteopenic. There is degenerative changes of the left AC joint as well as arthritic changes of the base of the thumb. No joint effusion. The soft tissues are unremarkable. IMPRESSION: 1. No acute fracture or dislocation. 2. Osteopenia and degenerative changes. Electronically Signed   By: Elgie Collard M.D.   On: 09/26/2022 22:31   DG Forearm Left  Result Date: 09/26/2022 CLINICAL DATA:  Fall and trauma to the left upper extremity. EXAM: LEFT HUMERUS - 2+ VIEW; LEFT WRIST - COMPLETE 3+ VIEW; LEFT FOREARM - 2 VIEW COMPARISON:  None Available. FINDINGS: There is no acute fracture or dislocation. The bones are osteopenic. There is degenerative changes of the left AC joint as well as arthritic changes of the base of the thumb. No joint effusion. The soft tissues are unremarkable. IMPRESSION: 1. No acute fracture or dislocation. 2. Osteopenia and degenerative changes. Electronically Signed   By: Elgie Collard M.D.   On: 09/26/2022 22:31   DG Wrist Complete Left  Result Date: 09/26/2022 CLINICAL DATA:  Fall and trauma to the left upper extremity. EXAM: LEFT HUMERUS - 2+ VIEW; LEFT WRIST - COMPLETE 3+ VIEW; LEFT FOREARM - 2 VIEW COMPARISON:  None Available. FINDINGS: There is no acute fracture or dislocation. The bones are osteopenic. There is degenerative changes of the left AC joint as well as arthritic changes of the base of the thumb. No joint effusion. The soft tissues are unremarkable. IMPRESSION: 1. No acute fracture or dislocation. 2. Osteopenia and degenerative changes.  Electronically Signed   By: Elgie Collard M.D.   On: 09/26/2022 22:31   DG Chest Port 1 View  Result Date: 09/26/2022 CLINICAL DATA:  Questionable sepsis EXAM: PORTABLE CHEST 1 VIEW COMPARISON:  Chest x-ray 06/22/2022 FINDINGS: The heart size and mediastinal contours are within normal limits. Both lungs are clear. The visualized skeletal structures are unremarkable. IMPRESSION: No active disease. Electronically Signed   By: Mcneil Sober.D.  On: 09/26/2022 22:28   CT Maxillofacial Wo Contrast  Result Date: 09/26/2022 CLINICAL DATA:  Facial trauma, blunt.  Found down. EXAM: CT MAXILLOFACIAL WITHOUT CONTRAST TECHNIQUE: Multidetector CT imaging of the maxillofacial structures was performed. Multiplanar CT image reconstructions were also generated. RADIATION DOSE REDUCTION: This exam was performed according to the departmental dose-optimization program which includes automated exposure control, adjustment of the mA and/or kV according to patient size and/or use of iterative reconstruction technique. COMPARISON:  None Available. FINDINGS: Osseous: No fracture or mandibular dislocation. No destructive process. Orbits: Negative. No traumatic or inflammatory finding. Sinuses: Clear Soft tissues: Soft tissue swelling in the right face. Limited intracranial: See head CT report IMPRESSION: No facial or orbital fracture. Electronically Signed   By: Charlett Nose M.D.   On: 09/26/2022 22:07   CT Cervical Spine Wo Contrast  Result Date: 09/26/2022 CLINICAL DATA:  Neck trauma (Age >= 65y).  Found down. EXAM: CT CERVICAL SPINE WITHOUT CONTRAST TECHNIQUE: Multidetector CT imaging of the cervical spine was performed without intravenous contrast. Multiplanar CT image reconstructions were also generated. RADIATION DOSE REDUCTION: This exam was performed according to the departmental dose-optimization program which includes automated exposure control, adjustment of the mA and/or kV according to patient size and/or use  of iterative reconstruction technique. COMPARISON:  None Available. FINDINGS: Alignment: Normal Skull base and vertebrae: No acute fracture. No primary bone lesion or focal pathologic process. Soft tissues and spinal canal: No prevertebral fluid or swelling. No visible canal hematoma. Disc levels: Degenerative disc disease with disc space narrowing and spurring most pronounced in the lower cervical spine. Moderate bilateral degenerative facet disease, left greater than right. Upper chest: No acute findings Other: None IMPRESSION: Degenerative disc and facet disease. No acute bony abnormality. Electronically Signed   By: Charlett Nose M.D.   On: 09/26/2022 22:04   CT Head Wo Contrast  Result Date: 09/26/2022 CLINICAL DATA:  Head trauma, minor (Age >= 65y).  Found down EXAM: CT HEAD WITHOUT CONTRAST TECHNIQUE: Contiguous axial images were obtained from the base of the skull through the vertex without intravenous contrast. RADIATION DOSE REDUCTION: This exam was performed according to the departmental dose-optimization program which includes automated exposure control, adjustment of the mA and/or kV according to patient size and/or use of iterative reconstruction technique. COMPARISON:  None Available. FINDINGS: Brain: There is atrophy and chronic small vessel disease changes. No acute intracranial abnormality. Specifically, no hemorrhage, hydrocephalus, mass lesion, acute infarction, or significant intracranial injury. Vascular: No hyperdense vessel or unexpected calcification. Skull: No acute calvarial abnormality. Sinuses/Orbits: No acute findings Other: None IMPRESSION: Atrophy, chronic microvascular disease. No acute intracranial abnormality. Electronically Signed   By: Charlett Nose M.D.   On: 09/26/2022 22:03      IMPRESSION AND PLAN:  Assessment and Plan: * Rhabdomyolysis - This likely secondary to a fall that could be associated with the syncope. - She will be admitted to a cardiac telemetry bed. -  We will continue hydration with IV saline. - We will follow serial CK levels.  Elevated troponin - While this could be associated with acute rhabdomyolysis, ACS will be considered in the differential diagnoses that could have led to her syncope and fall. - She denies any chest pain. - We will still follow serial troponins. - We will continue beta-blocker therapy with Toprol-XL as well as Plavix and aspirin. - Will obtain a 2D echo for assessment of this elevated troponin as well as possible syncope. - Cardiology consult can be considered in a.m.  Acute kidney injury superimposed on chronic kidney disease (HCC) - This is likely prerenal due to volume depletion and dehydration. - The patient will be hydrated with IV normal saline. - We will avoid nephrotoxins. - Will follow BMPs.  Type 2 diabetes mellitus with peripheral neuropathy (HCC) - The patient will be placed on supplemental coverage with NovoLog. - We will continue [place]. - We will continue Lyrica.  Altered mental status - This likely secondary to #1 and 3. - We will monitor mental status. - We will follow neurochecks every 4 hours for 24 hours.  Essential hypertension - We will continue her antihypertensives.  Dyslipidemia - We will hold off statin therapy given her acute rhabdomyolysis.  Anxiety and depression - Working continue BuSpar and Cymbalta.  Congestive heart failure (HCC) - We will continue Toprol-XL and [place].   DVT prophylaxis: Lovenox.  Advanced Care Planning:  Code Status: The patient is DNR/DNI. Family Communication:  The plan of care was discussed in details with the patient (and family). I answered all questions. The patient agreed to proceed with the above mentioned plan. Further management will depend upon hospital course. Disposition Plan: Back to previous home environment Consults called: None. All the records are reviewed and case discussed with ED provider.  Status is: Inpatient  At  the time of the admission, it appears that the appropriate admission status for this patient is inpatient.  This is judged to be reasonable and necessary in order to provide the required intensity of service to ensure the patient's safety given the presenting symptoms, physical exam findings and initial radiographic and laboratory data in the context of comorbid conditions.  The patient requires inpatient status due to high intensity of service, high risk of further deterioration and high frequency of surveillance required.  I certify that at the time of admission, it is my clinical judgment that the patient will require inpatient hospital care extending more than 2 midnights.                            Dispo: The patient is from: Home              Anticipated d/c is to: Home              Patient currently is not medically stable to d/c.              Difficult to place patient: No  Hannah Beat M.D on 09/27/2022 at 12:33 AM  [facility]   From 7 PM-7 AM, contact night-coverage [email or web address]  CC: Primary care physician; Margaretann Loveless, MD

## 2022-09-26 NOTE — Assessment & Plan Note (Signed)
-   We will hold off statin therapy given her acute rhabdomyolysis.

## 2022-09-26 NOTE — Assessment & Plan Note (Signed)
-   The patient will be placed on supplemental coverage with NovoLog. - We will continue [place]. - We will continue Lyrica.

## 2022-09-26 NOTE — Progress Notes (Signed)
PHARMACIST - PHYSICIAN COMMUNICATION  CONCERNING:  Enoxaparin (Lovenox) for DVT Prophylaxis    RECOMMENDATION: Patient was prescribed enoxaprin 40mg  q24 hours for VTE prophylaxis.   Filed Weights   09/26/22 2111  Weight: 71.4 kg (157 lb 6.4 oz)    Body mass index is 28.79 kg/m.  Estimated Creatinine Clearance: 21.4 mL/min (A) (by C-G formula based on SCr of 1.71 mg/dL (H)).  Patient is candidate for enoxaparin 30mg  every 24 hours based on CrCl <20ml/min or Weight <45kg  DESCRIPTION: Pharmacy has adjusted enoxaparin dose per [facility] policy.  Patient is now receiving enoxaparin 30 mg every 24 hours   Otelia Sergeant, PharmD, [facility] 09/26/2022 11:34 PM

## 2022-09-26 NOTE — Assessment & Plan Note (Signed)
-   Working continue Corporate treasurer.

## 2022-09-26 NOTE — H&P (Incomplete)
[place]   PATIENT NAME: Kenneth Lax    MR#:  [id number]  DATE OF BIRTH:  03-Sep-1933  DATE OF ADMISSION:  09/26/2022  PRIMARY CARE PHYSICIAN: Margaretann Loveless, MD   Patient is coming from: Home  REQUESTING/REFERRING PHYSICIAN: Corena Herter, MD  CHIEF COMPLAINT:   Chief Complaint  Patient presents with  . Fall    HISTORY OF PRESENT ILLNESS:  DANAMARIE MINAMI is a 89 y.o. Caucasian female with medical history significant for CHF, coronary artery disease, hypertension and osteoarthritis, who presented to the emergency room with acute onset of fall.  The patient was last heard from 3 days ago.  She lives alone.  Her friend was checking on her today and found her facedown on the ground.  She was complaining of pain in the upper back and the right side of her face.  She does not recall a fall or why she ended up on the ground.  She was not oriented to the day but was oriented to person, place and year.  No chest pain or palpitations.  No cough or wheezing or hemoptysis.  No abdominal pain or nausea or vomiting or diarrhea.  No dysuria, oliguria, urinary frequency or urgency or flank pain.  ED Course: When the patient came to the ER, BP was 148/82 with otherwise normal vital signs.  Labs revealed BUN of 67 and creatinine 1.71 and a CO2 of 21, AST of 71 and total bili of 1.5.  Total CK was 1607 and high sensitive troponin I was 979.  CBC was within normal.  Blood culture was sent. EKG as reviewed by me : EKG showed normal sinus rhythm with a rate of 89 with low voltage QRS. Imaging: Noncontrasted CT scan showed chronic microvascular disease and atrophy with no acute intracranial normalities.  Maxillofacial CT showed soft tissue swelling in the right face with no other facial or orbital fractures.  C-spine CT showed degenerative disc and facet disease with no fractures.  Portable chest x-ray showed no acute cardiopulmonary disease.  Left forearm x-ray showed osteopenia and degenerative  changes with no acute fracture or dislocation.  Left humerus x-ray showed no acute fracture or dislocation.  Showed osteopenia and generative changes.  And left wrist x-ray was unremarkable.  The patient was given 1 L bolus of IV lactated Ringer and 1 L of IV normal saline.  She will be admitted to a cardiac telemetry bed for further evaluation and management. PAST MEDICAL HISTORY:   Past Medical History:  Diagnosis Date  . Arthritis of carpometacarpal [facility]) joint of left thumb 07/11/2021  . CHF (congestive heart failure) (HCC)   . Heart attack (HCC)   . Hypertension   . Shortness of breath 12/22/2015   Note: Unchanged    PAST SURGICAL HISTORY:   Past Surgical History:  Procedure Laterality Date  . BACK SURGERY    . carpel tunnel surgery    . CATARACT EXTRACTION    . ROTATOR CUFF REPAIR Left     SOCIAL HISTORY:   Social History   Tobacco Use  . Smoking status: Never  . Smokeless tobacco: Never  Substance Use Topics  . Alcohol use: No    FAMILY HISTORY:   Family History  Problem Relation Age of Onset  . Deep vein thrombosis Mother   . Stroke Mother   . Hypertension Father   . Emphysema Father   . Healthy Brother     DRUG ALLERGIES:   Allergies  Allergen  Reactions  . Shellfish Allergy Swelling  . Fenofibrate   . Meloxicam   . Tramadol     REVIEW OF SYSTEMS:   ROS As per history of present illness. All pertinent systems were reviewed above. Constitutional, HEENT, cardiovascular, respiratory, GI, GU, musculoskeletal, neuro, psychiatric, endocrine, integumentary and hematologic systems were reviewed and are otherwise negative/unremarkable except for positive findings mentioned above in the HPI.   MEDICATIONS AT HOME:   Prior to Admission medications   Medication Sig Start Date End Date Taking? Authorizing Provider  pregabalin (LYRICA) 75 MG capsule Take 1 capsule (75 mg total) by mouth 2 (two) times daily. 08/08/22   Miki Kins, FNP  albuterol  (VENTOLIN HFA) 108 (90 Base) MCG/ACT inhaler Inhale 2 puffs into the lungs every 6 (six) hours as needed for wheezing or shortness of breath. 05/04/22   Margaretann Loveless, MD  amLODipine (NORVASC) 5 MG tablet Take 5 mg by mouth daily. 10/26/19   [provider]  amoxicillin-clavulanate (AUGMENTIN) 500-125 MG tablet Take 1 tablet by mouth in the morning and at bedtime. 06/22/22   Margaretann Loveless, MD  aspirin 81 MG tablet Take 81 mg by mouth daily.    [provider]  busPIRone (BUSPAR) 10 MG tablet Take 10 mg by mouth 2 (two) times daily.     [provider]  cholecalciferol (VITAMIN D) 1000 UNITS tablet Take 1,000 Units by mouth daily.    [provider]  clopidogrel (PLAVIX) 75 MG tablet Take 75 mg by mouth daily.    [provider]  DULoxetine (CYMBALTA) 30 MG capsule Take 30 mg by mouth daily.  09/07/18   [provider]  FARXIGA 10 MG TABS tablet Take 10 mg by mouth daily. 02/01/22   [provider]  hydrOXYzine (ATARAX) 10 MG tablet TAKE 1 TABLET BY MOUTH TWICE A DAY 08/10/22   Margaretann Loveless, MD  metoprolol succinate (TOPROL-XL) 25 MG 24 hr tablet Take 25 mg by mouth daily. 09/18/19   [provider]  Multiple Vitamins-Minerals (CENTRUM SILVER 50+WOMEN PO) Take by mouth daily.    [provider]  nitroGLYCERIN (NITROSTAT) 0.4 MG SL tablet Place 0.4 mg under the tongue every 5 (five) minutes as needed.    [provider]  ondansetron (ZOFRAN) 4 MG tablet Take 1 tablet (4 mg total) by mouth every 8 (eight) hours as needed for nausea or vomiting. 07/02/22   Margaretann Loveless, MD  OVER THE COUNTER MEDICATION at bedtime. CBD    [provider]  OVER THE COUNTER MEDICATION daily. BEET ROOT    [provider]  PREBIOTIC PRODUCT PO Take by mouth daily.    [provider]  Probiotic Product (PROBIOTIC PO) Take by mouth daily.    [provider]  rosuvastatin (CRESTOR) 40 MG tablet Take 40 mg by  mouth daily.    [provider]  triamcinolone (KENALOG) 0.025 % cream Apply 1 Application topically 2 (two) times daily. 04/14/22   [provider]  VASCEPA 1 g capsule Take 2 g by mouth 2 (two) times daily.    [provider]      VITAL SIGNS:  Blood pressure (!) 159/72, pulse 61, temperature 98.4 F (36.9 C), temperature source Oral, resp. rate 16, weight 71.4 kg, SpO2 100%.  PHYSICAL EXAMINATION:  Physical Exam  GENERAL:  89 y.o.-year-old Caucasian female patient lying in the bed with no acute distress.  EYES: Pupils equal, round, reactive to light and accommodation. No scleral  icterus. Extraocular muscles intact.  HEENT: Head atraumatic, normocephalic. Oropharynx and nasopharynx clear.  NECK:  Supple, no jugular venous distention. No thyroid enlargement, no tenderness.  LUNGS: Normal breath sounds bilaterally, no wheezing, rales,rhonchi or crepitation. No use of accessory muscles of respiration.  CARDIOVASCULAR: Regular rate and rhythm, S1, S2 normal. No murmurs, rubs, or gallops.  ABDOMEN: Soft, nondistended, nontender. Bowel sounds present. No organomegaly or mass.  EXTREMITIES: No pedal edema, cyanosis, or clubbing.  NEUROLOGIC: Cranial nerves II through XII are intact. Muscle strength 5/5 in all extremities. Sensation intact. Gait not checked.  PSYCHIATRIC: The patient is alert and oriented x 3.  Normal affect and good eye contact. SKIN: No obvious rash, lesion, or ulcer.   LABORATORY PANEL:   CBC Recent Labs  Lab 09/26/22 2140  WBC 9.1  HGB 14.6  HCT 44.4  PLT 173   ------------------------------------------------------------------------------------------------------------------  Chemistries  Recent Labs  Lab 09/26/22 2140  NA 145  K 3.9  CL 111  CO2 21*  GLUCOSE 114*  BUN 67*  CREATININE 1.71*  CALCIUM 9.4  AST 71*  ALT 32  ALKPHOS 59  BILITOT 1.5*    ------------------------------------------------------------------------------------------------------------------  Cardiac Enzymes No results for input(s): "TROPONINI" in the last 168 hours. ------------------------------------------------------------------------------------------------------------------  RADIOLOGY:  DG Humerus Left  Result Date: 09/26/2022 CLINICAL DATA:  Fall and trauma to the left upper extremity. EXAM: LEFT HUMERUS - 2+ VIEW; LEFT WRIST - COMPLETE 3+ VIEW; LEFT FOREARM - 2 VIEW COMPARISON:  None Available. FINDINGS: There is no acute fracture or dislocation. The bones are osteopenic. There is degenerative changes of the left AC joint as well as arthritic changes of the base of the thumb. No joint effusion. The soft tissues are unremarkable. IMPRESSION: 1. No acute fracture or dislocation. 2. Osteopenia and degenerative changes. Electronically Signed   By: Elgie Collard M.D.   On: 09/26/2022 22:31   DG Forearm Left  Result Date: 09/26/2022 CLINICAL DATA:  Fall and trauma to the left upper extremity. EXAM: LEFT HUMERUS - 2+ VIEW; LEFT WRIST - COMPLETE 3+ VIEW; LEFT FOREARM - 2 VIEW COMPARISON:  None Available. FINDINGS: There is no acute fracture or dislocation. The bones are osteopenic. There is degenerative changes of the left AC joint as well as arthritic changes of the base of the thumb. No joint effusion. The soft tissues are unremarkable. IMPRESSION: 1. No acute fracture or dislocation. 2. Osteopenia and degenerative changes. Electronically Signed   By: Elgie Collard M.D.   On: 09/26/2022 22:31   DG Wrist Complete Left  Result Date: 09/26/2022 CLINICAL DATA:  Fall and trauma to the left upper extremity. EXAM: LEFT HUMERUS - 2+ VIEW; LEFT WRIST - COMPLETE 3+ VIEW; LEFT FOREARM - 2 VIEW COMPARISON:  None Available. FINDINGS: There is no acute fracture or dislocation. The bones are osteopenic. There is degenerative changes of the left AC joint as well as arthritic  changes of the base of the thumb. No joint effusion. The soft tissues are unremarkable. IMPRESSION: 1. No acute fracture or dislocation. 2. Osteopenia and degenerative changes. Electronically Signed   By: Elgie Collard M.D.   On: 09/26/2022 22:31   DG Chest Port 1 View  Result Date: 09/26/2022 CLINICAL DATA:  Questionable sepsis EXAM: PORTABLE CHEST 1 VIEW COMPARISON:  Chest x-ray 06/22/2022 FINDINGS: The heart size and mediastinal contours are within normal limits. Both lungs are clear. The visualized skeletal structures are unremarkable. IMPRESSION: No active disease. Electronically Signed   By: Darliss Cheney M.D.   On:  09/26/2022 22:28   CT Maxillofacial Wo Contrast  Result Date: 09/26/2022 CLINICAL DATA:  Facial trauma, blunt.  Found down. EXAM: CT MAXILLOFACIAL WITHOUT CONTRAST TECHNIQUE: Multidetector CT imaging of the maxillofacial structures was performed. Multiplanar CT image reconstructions were also generated. RADIATION DOSE REDUCTION: This exam was performed according to the departmental dose-optimization program which includes automated exposure control, adjustment of the mA and/or kV according to patient size and/or use of iterative reconstruction technique. COMPARISON:  None Available. FINDINGS: Osseous: No fracture or mandibular dislocation. No destructive process. Orbits: Negative. No traumatic or inflammatory finding. Sinuses: Clear Soft tissues: Soft tissue swelling in the right face. Limited intracranial: See head CT report IMPRESSION: No facial or orbital fracture. Electronically Signed   By: Charlett Nose M.D.   On: 09/26/2022 22:07   CT Cervical Spine Wo Contrast  Result Date: 09/26/2022 CLINICAL DATA:  Neck trauma (Age >= 65y).  Found down. EXAM: CT CERVICAL SPINE WITHOUT CONTRAST TECHNIQUE: Multidetector CT imaging of the cervical spine was performed without intravenous contrast. Multiplanar CT image reconstructions were also generated. RADIATION DOSE REDUCTION: This exam was  performed according to the departmental dose-optimization program which includes automated exposure control, adjustment of the mA and/or kV according to patient size and/or use of iterative reconstruction technique. COMPARISON:  None Available. FINDINGS: Alignment: Normal Skull base and vertebrae: No acute fracture. No primary bone lesion or focal pathologic process. Soft tissues and spinal canal: No prevertebral fluid or swelling. No visible canal hematoma. Disc levels: Degenerative disc disease with disc space narrowing and spurring most pronounced in the lower cervical spine. Moderate bilateral degenerative facet disease, left greater than right. Upper chest: No acute findings Other: None IMPRESSION: Degenerative disc and facet disease. No acute bony abnormality. Electronically Signed   By: Charlett Nose M.D.   On: 09/26/2022 22:04   CT Head Wo Contrast  Result Date: 09/26/2022 CLINICAL DATA:  Head trauma, minor (Age >= 65y).  Found down EXAM: CT HEAD WITHOUT CONTRAST TECHNIQUE: Contiguous axial images were obtained from the base of the skull through the vertex without intravenous contrast. RADIATION DOSE REDUCTION: This exam was performed according to the departmental dose-optimization program which includes automated exposure control, adjustment of the mA and/or kV according to patient size and/or use of iterative reconstruction technique. COMPARISON:  None Available. FINDINGS: Brain: There is atrophy and chronic small vessel disease changes. No acute intracranial abnormality. Specifically, no hemorrhage, hydrocephalus, mass lesion, acute infarction, or significant intracranial injury. Vascular: No hyperdense vessel or unexpected calcification. Skull: No acute calvarial abnormality. Sinuses/Orbits: No acute findings Other: None IMPRESSION: Atrophy, chronic microvascular disease. No acute intracranial abnormality. Electronically Signed   By: Charlett Nose M.D.   On: 09/26/2022 22:03      IMPRESSION AND  PLAN:  Assessment and Plan: No notes have been filed under this hospital service. Service: Hospitalist      DVT prophylaxis: Lovenox***  Advanced Care Planning:  Code Status: full code***  Family Communication:  The plan of care was discussed in details with the patient (and family). I answered all questions. The patient agreed to proceed with the above mentioned plan. Further management will depend upon hospital course. Disposition Plan: Back to previous home environment Consults called: none***  All the records are reviewed and case discussed with ED provider.  Status is: Inpatient {Inpatient:23812}   At the time of the admission, it appears that the appropriate admission status for this patient is inpatient.  This is judged to be reasonable and necessary in order  to provide the required intensity of service to ensure the patient's safety given the presenting symptoms, physical exam findings and initial radiographic and laboratory data in the context of comorbid conditions.  The patient requires inpatient status due to high intensity of service, high risk of further deterioration and high frequency of surveillance required.  I certify that at the time of admission, it is my clinical judgment that the patient will require inpatient hospital care extending more than 2 midnights.                            Dispo: The patient is from: Home              Anticipated d/c is to: Home              Patient currently is not medically stable to d/c.              Difficult to place patient: No  Hannah Beat M.D on 09/26/2022 at 11:22 PM  [facility]   From 7 PM-7 AM, contact night-coverage [email or web address]  CC: Primary care physician; Margaretann Loveless, MD

## 2022-09-26 NOTE — ED Triage Notes (Signed)
Patient was last heard from three days ago. She was checked on by a friend today and found face down. Patient is C/O upper back pain and redness and swelling of the right side of her face. Of note, patient has very strong smell of urine and ammonia. Patient is alert and oriented x4 at this time.

## 2022-09-26 NOTE — Assessment & Plan Note (Signed)
-   We will continue her antihypertensives. 

## 2022-09-26 NOTE — Assessment & Plan Note (Signed)
-   This likely secondary to a fall that could be associated with the syncope. - She will be admitted to a cardiac telemetry bed. - We will continue hydration with IV saline. - We will follow serial CK levels.

## 2022-09-26 NOTE — ED Provider Notes (Incomplete)
[facility] Provider Note    Event Date/Time   First MD Initiated Contact with Patient 09/26/22 2104     (approximate)   History   Fall   HPI  Gail Paul is a 89 y.o. female presents to the emergency department after being found down for unknown amount of time.  Patient was last heard from approximately 3 days ago.  She lives alone by herself.  Checked on by a friend today and was found facedown on the ground.  Patient complaining of some pain in her upper back and the right side of her face.  She is alert to person, place and year.  Does not recall a fall or why she ended up on the ground.  Believes that it is Sunday.  Denies any burning with urination.  Denies any abdominal pain or chest pain at this time.  Not on anticoagulation     Physical Exam   Triage Vital Signs: ED Triage Vitals  Encounter Vitals Group     BP 09/26/22 2115 (!) 148/82     Systolic BP Percentile --      Diastolic BP Percentile --      Pulse Rate 09/26/22 2115 84     Resp 09/26/22 2115 18     Temp 09/26/22 2115 98.4 F (36.9 C)     Temp Source 09/26/22 2115 Oral     SpO2 09/26/22 2115 100 %     Weight 09/26/22 2111 157 lb 6.4 oz (71.4 kg)     Height --      Head Circumference --      Peak Flow --      Pain Score --      Pain Loc --      Pain Education --      Exclude from Growth Chart --     Most recent vital signs: Vitals:   09/26/22 2115  BP: (!) 148/82  Pulse: 84  Resp: 18  Temp: 98.4 F (36.9 C)  SpO2: 100%    Physical Exam Constitutional:      Appearance: She is well-developed.  HENT:     Head:     Comments: Abrasion to the forehead.  Swelling to the right side of the face with erythema.  Extraocular movements intact.  No septal hematoma. Eyes:     Conjunctiva/sclera: Conjunctivae normal.  Cardiovascular:     Rate and Rhythm: Regular rhythm.  Pulmonary:     Effort: No respiratory distress.  Abdominal:     General: There is no distension.      Tenderness: There is no abdominal tenderness.  Musculoskeletal:        General: Tenderness present. Normal range of motion.     Cervical back: Normal range of motion.     Comments: Redness and tenderness to the left upper arm.  +2 radial pulses that are equal bilaterally.  Does have tenderness to the left wrist.  Skin:    General: Skin is warm.  Neurological:     Mental Status: She is alert. Mental status is at baseline.     IMPRESSION / MDM / ASSESSMENT AND PLAN / ED COURSE  I reviewed the triage vital signs and the nursing notes.  *** EKG  I, Corena Herter, the attending physician, personally viewed and interpreted this ECG.  Low voltage.  Normal intervals.  No significant ST elevation or depression.  No signs of acute ischemia or dysrhythmia.  No tachycardic or bradycardic dysrhythmias while on cardiac  telemetry.  RADIOLOGY I independently reviewed imaging, my interpretation of imaging: ***  LABS (all labs ordered are listed, but only abnormal results are displayed) Labs interpreted as -    Labs Reviewed  CBG MONITORING, ED - Abnormal; Notable for the following components:      Result Value   Glucose-Capillary 119 (*)    All other components within normal limits     MDM       PROCEDURES:  Critical Care performed: No  Procedures  Patient's presentation is most consistent with {EM COPA:27473}   MEDICATIONS ORDERED IN ED: Medications - No data to display  FINAL CLINICAL IMPRESSION(S) / ED DIAGNOSES   Final diagnoses:  None     Rx / DC Orders   ED Discharge Orders     None        Note:  This document was prepared using Dragon voice recognition software and may include unintentional dictation errors.

## 2022-09-26 NOTE — ED Provider Notes (Signed)
[facility] Provider Note    Event Date/Time   First MD Initiated Contact with Patient 09/26/22 2104     (approximate)   History   Fall   HPI  Gail Paul is a 89 y.o. female presents to the emergency department after being found down for unknown amount of time.  Patient was last heard from approximately 3 days ago.  She lives alone by herself.  Checked on by a friend today and was found facedown on the ground.  Patient complaining of some pain in her upper back and the right side of her face.  She is alert to person, place and year.  Does not recall a fall or why she ended up on the ground.  Believes that it is Sunday.  Denies any burning with urination.  Denies any abdominal pain or chest pain at this time.  Not on anticoagulation     Physical Exam   Triage Vital Signs: ED Triage Vitals  Encounter Vitals Group     BP 09/26/22 2115 (!) 148/82     Systolic BP Percentile --      Diastolic BP Percentile --      Pulse Rate 09/26/22 2115 84     Resp 09/26/22 2115 18     Temp 09/26/22 2115 98.4 F (36.9 C)     Temp Source 09/26/22 2115 Oral     SpO2 09/26/22 2115 100 %     Weight 09/26/22 2111 157 lb 6.4 oz (71.4 kg)     Height --      Head Circumference --      Peak Flow --      Pain Score --      Pain Loc --      Pain Education --      Exclude from Growth Chart --     Most recent vital signs: Vitals:   09/26/22 2115 09/26/22 2300  BP: (!) 148/82 (!) 159/72  Pulse: 84 61  Resp: 18 16  Temp: 98.4 F (36.9 C)   SpO2: 100% 100%    Physical Exam Constitutional:      Appearance: She is well-developed.  HENT:     Head:     Comments: Abrasion to the forehead.  Swelling to the right side of the face with erythema.  Extraocular movements intact.  No septal hematoma. Eyes:     Conjunctiva/sclera: Conjunctivae normal.  Cardiovascular:     Rate and Rhythm: Regular rhythm.  Pulmonary:     Effort: No respiratory distress.  Abdominal:      General: There is no distension.     Tenderness: There is no abdominal tenderness.  Musculoskeletal:        General: Tenderness present. Normal range of motion.     Cervical back: Normal range of motion.     Comments: Redness and tenderness to the left upper arm.  +2 radial pulses that are equal bilaterally.  Does have tenderness to the left wrist.  Skin:    General: Skin is warm.  Neurological:     Mental Status: She is alert. Mental status is at baseline.     IMPRESSION / MDM / ASSESSMENT AND PLAN / ED COURSE  I reviewed the triage vital signs and the nursing notes.  Differential diagnosis including dehydration, rhabdomyolysis, CVA, intracranial hemorrhage, ACS, electrolyte abnormality, fracture, dislocation, cervical fracture.  On chart review patient appears to have normal mental status and is normally alert and oriented x 4, lives by herself  and does all of her own ADLs.  EKG  I, Corena Herter, the attending physician, personally viewed and interpreted this ECG.  Low voltage.  Normal intervals.  No significant ST elevation or depression.  No signs of acute ischemia or dysrhythmia.  No tachycardic or bradycardic dysrhythmias while on cardiac telemetry.  RADIOLOGY I independently reviewed imaging, my interpretation of imaging: X-ray without acute fracture or dislocation  CT scan of the head without signs of intracranial hemorrhage  CT scan of the cervical spine without signs of acute fracture.  Imaging read as no acute findings  LABS (all labs ordered are listed, but only abnormal results are displayed) Labs interpreted as -    Labs Reviewed  LACTIC ACID, PLASMA - Abnormal; Notable for the following components:      Result Value   Lactic Acid, Venous 2.4 (*)    All other components within normal limits  LACTIC ACID, PLASMA - Abnormal; Notable for the following components:   Lactic Acid, Venous 2.1 (*)    All other components within normal limits  COMPREHENSIVE  METABOLIC PANEL - Abnormal; Notable for the following components:   CO2 21 (*)    Glucose, Bld 114 (*)    BUN 67 (*)    Creatinine, Ser 1.71 (*)    AST 71 (*)    Total Bilirubin 1.5 (*)    GFR, Estimated 29 (*)    All other components within normal limits  PROTIME-INR - Abnormal; Notable for the following components:   Prothrombin Time 15.9 (*)    INR 1.3 (*)    All other components within normal limits  CK - Abnormal; Notable for the following components:   Total CK 1,607 (*)    All other components within normal limits  CBG MONITORING, ED - Abnormal; Notable for the following components:   Glucose-Capillary 119 (*)    All other components within normal limits  TROPONIN I (HIGH SENSITIVITY) - Abnormal; Notable for the following components:   Troponin I (High Sensitivity) 979 (*)    All other components within normal limits  CULTURE, BLOOD (SINGLE)  CBC WITH DIFFERENTIAL/PLATELET  APTT  URINALYSIS, W/ REFLEX TO CULTURE (INFECTION SUSPECTED)  BASIC METABOLIC PANEL  CBC  CK  LIPID PANEL  TROPONIN I (HIGH SENSITIVITY)  TROPONIN I (HIGH SENSITIVITY)     MDM    Multiple lab work abnormalities, clinical picture concerning for rhabdomyolysis with a CK elevated at 1600.  Does have an acute kidney injury with an elevated BUN of appears prerenal.  Significantly elevated troponin in the 900s.  Possibly demand ischemia or secondary to her rhabdomyolysis.  No active chest pain at this time.  EKG without findings of acute ischemia or dysrhythmia.  Chest x-ray with no signs of pneumonia or heart failure.  No obvious source of an infectious process.  Initial lactic acid 2.4.  Ordered 2 L of IV fluids given rhabdomyolysis  Consulted hospitalist for admission for rhabdomyolysis, elevated troponin and acute kidney injury with altered mental status.   PROCEDURES:  Critical Care performed: yes  .Critical Care  Performed by: Corena Herter, MD Authorized by: Corena Herter, MD    Critical care provider statement:    Critical care time (minutes):  30   Critical care time was exclusive of:  Separately billable procedures and treating other patients   Critical care was necessary to treat or prevent imminent or life-threatening deterioration of the following conditions:  Circulatory failure   Critical care was time spent personally by me  on the following activities:  Development of treatment plan with patient or surrogate, discussions with consultants, evaluation of patient's response to treatment, examination of patient, ordering and review of laboratory studies, ordering and review of radiographic studies, ordering and performing treatments and interventions, pulse oximetry, re-evaluation of patient's condition and review of old charts   Patient's presentation is most consistent with acute presentation with potential threat to life or bodily function.   MEDICATIONS ORDERED IN ED: Medications  aspirin tablet 81 mg (has no administration in time range)  amLODipine (NORVASC) tablet 5 mg (has no administration in time range)  metoprolol succinate (TOPROL-XL) 24 hr tablet 25 mg (has no administration in time range)  nitroGLYCERIN (NITROSTAT) SL tablet 0.4 mg (has no administration in time range)  icosapent Ethyl (VASCEPA) 1 g capsule 2 g (has no administration in time range)  busPIRone (BUSPAR) tablet 10 mg (has no administration in time range)  DULoxetine (CYMBALTA) DR capsule 30 mg (has no administration in time range)  hydrOXYzine (ATARAX) tablet 10 mg (has no administration in time range)  dapagliflozin propanediol (FARXIGA) tablet 10 mg (has no administration in time range)  acidophilus (RISAQUAD) capsule 1 capsule (has no administration in time range)  clopidogrel (PLAVIX) tablet 75 mg (has no administration in time range)  pregabalin (LYRICA) capsule 75 mg (has no administration in time range)  cholecalciferol (VITAMIN D3) 25 MCG (1000 UNIT) tablet 1,000 Units (has  no administration in time range)  multivitamin with minerals tablet 1 tablet (has no administration in time range)  triamcinolone (KENALOG) 0.025 % cream 1 Application (has no administration in time range)  enoxaparin (LOVENOX) injection 30 mg (has no administration in time range)  0.9 %  sodium chloride infusion (has no administration in time range)  acetaminophen (TYLENOL) tablet 650 mg (has no administration in time range)    Or  acetaminophen (TYLENOL) suppository 650 mg (has no administration in time range)  traZODone (DESYREL) tablet 25 mg (has no administration in time range)  magnesium hydroxide (MILK OF MAGNESIA) suspension 30 mL (has no administration in time range)  ondansetron (ZOFRAN) tablet 4 mg (has no administration in time range)    Or  ondansetron (ZOFRAN) injection 4 mg (has no administration in time range)  morphine (PF) 2 MG/ML injection 2 mg (has no administration in time range)  albuterol (PROVENTIL) (2.5 MG/3ML) 0.083% nebulizer solution 2.5 mg (has no administration in time range)  lactated ringers bolus 1,000 mL (0 mLs Intravenous Stopped 09/26/22 2229)  sodium chloride 0.9 % bolus 1,000 mL (1,000 mLs Intravenous New Bag/Given 09/26/22 2238)    FINAL CLINICAL IMPRESSION(S) / ED DIAGNOSES   Final diagnoses:  Traumatic rhabdomyolysis, initial encounter (HCC)  Elevated troponin  AKI (acute kidney injury) (HCC)     Rx / DC Orders   ED Discharge Orders     None        Note:  This document was prepared using Dragon voice recognition software and may include unintentional dictation errors.   Corena Herter, MD 09/27/22 [phone]

## 2022-09-26 NOTE — Assessment & Plan Note (Signed)
-   We will continue Toprol-XL and [place].

## 2022-09-27 DIAGNOSIS — R4182 Altered mental status, unspecified: Secondary | ICD-10-CM

## 2022-09-27 DIAGNOSIS — N189 Chronic kidney disease, unspecified: Secondary | ICD-10-CM

## 2022-09-27 DIAGNOSIS — T796XXD Traumatic ischemia of muscle, subsequent encounter: Secondary | ICD-10-CM

## 2022-09-27 LAB — LIPID PANEL
Cholesterol: 245 mg/dL — ABNORMAL HIGH (ref 0–200)
HDL: 32 mg/dL — ABNORMAL LOW (ref >40)
LDL Cholesterol: 174 mg/dL — ABNORMAL HIGH (ref 0–99)
Total CHOL/HDL Ratio: 7.7 RATIO
Triglycerides: 195 mg/dL — ABNORMAL HIGH (ref <150)
VLDL: 39 mg/dL (ref 0–40)

## 2022-09-27 LAB — TROPONIN I (HIGH SENSITIVITY)
Troponin I (High Sensitivity): 1005 ng/L (ref <18)
Troponin I (High Sensitivity): 1128 ng/L (ref <18)
Troponin I (High Sensitivity): 836 ng/L (ref <18)
Troponin I (High Sensitivity): 844 ng/L (ref <18)
Troponin I (High Sensitivity): 909 ng/L (ref <18)

## 2022-09-27 LAB — BASIC METABOLIC PANEL
Anion gap: 8 (ref 5–15)
BUN: 62 mg/dL — ABNORMAL HIGH (ref 8–23)
CO2: 22 mmol/L (ref 22–32)
Calcium: 8.3 mg/dL — ABNORMAL LOW (ref 8.9–10.3)
Chloride: 116 mmol/L — ABNORMAL HIGH (ref 98–111)
Creatinine, Ser: 1.47 mg/dL — ABNORMAL HIGH (ref 0.44–1.00)
GFR, Estimated: 34 mL/min — ABNORMAL LOW (ref >60)
Potassium: 3.8 mmol/L (ref 3.5–5.1)
Sodium: 146 mmol/L — ABNORMAL HIGH (ref 135–145)

## 2022-09-27 LAB — CBC
HCT: 37.2 % (ref 36.0–46.0)
Hemoglobin: 12.7 g/dL (ref 12.0–15.0)
MCHC: 34.1 g/dL (ref 30.0–36.0)
MCV: 91 fL (ref 80.0–100.0)
Platelets: 131 10*3/uL — ABNORMAL LOW (ref 150–400)
RBC: 4.09 MIL/uL (ref 3.87–5.11)
RDW: 12.6 % (ref 11.5–15.5)
WBC: 7.6 10*3/uL (ref 4.0–10.5)
nRBC: 0.3 % — ABNORMAL HIGH (ref 0.0–0.2)

## 2022-09-27 LAB — CULTURE, BLOOD (SINGLE): Culture: NO GROWTH

## 2022-09-27 LAB — LACTIC ACID, PLASMA: Lactic Acid, Venous: 2.1 mmol/L (ref 0.5–1.9)

## 2022-09-27 LAB — CK: Total CK: 1003 U/L — ABNORMAL HIGH (ref 38–234)

## 2022-09-27 NOTE — Progress Notes (Signed)
PROGRESS NOTE    Gail Paul  MVH:[id number] DOB: 17-Sep-1933 DOA: 09/26/2022 PCP: Margaretann Loveless, MD    Brief Narrative:  89 y.o. Caucasian female with medical history significant for CHF, coronary artery disease, hypertension and osteoarthritis, who presented to the emergency room with acute onset of fall.  The patient was last heard from 3 days ago.  She lives alone.  Her friend was checking on her today and found her facedown on the ground.  She was complaining of pain in the upper back and the right side of her face.  She does not recall a fall or why she ended up on the ground.  She was not oriented to the day but was oriented to person, place and year.  No reported chest pain or palpitations.  No report cough or wheezing or hemoptysis.  No reported abdominal pain or nausea or vomiting or diarrhea.  No dysuria, oliguria, urinary frequency or urgency or flank pain.  The patient was fairly asleep during my interview no further history could be obtained.   7/25: Troponins trending down.  CK trending down.   Assessment & Plan:   Principal Problem:   Rhabdomyolysis Active Problems:   Elevated troponin   Type 2 diabetes mellitus with peripheral neuropathy (HCC)   Acute kidney injury superimposed on chronic kidney disease (HCC)   Essential hypertension   Altered mental status   Congestive heart failure (HCC)   Anxiety and depression   Dyslipidemia  Traumatic rhabdomyolysis Likely secondary to extended time down.  Could be secondary to a fall associated with syncope.  Unclear etiology.  CK trending down. Plan: Continue aggressive IVF Follow serum CK levels Continue telemetry Therapy evaluations to start tomorrow    Elevated troponin Suspect this is related to supply/demand ischemia in the setting of acute rhabdomyolysis from extended time down.  Patient not having chest pain.  No EKG changes just ACS.  Troponins now downtrending. Plan: Continue telemetry monitoring for  now Continue beta-blocker, aspirin, Plavix Follow-up 2D echocardiogram Consider cardiology consult pending results of echo   Acute kidney injury Unable to completely rule in chronic kidney disease.  Baseline creatinine from 2017 appears to be normal.  Suspect this is acute kidney injury in the setting of hypovolemia/dehydration from extended time down. Plan: Aggressive IVF Daily BMP  Type 2 diabetes mellitus with peripheral neuropathy (HCC) PTA Farxiga and Lyrica continued.  Supplemental coverage with NovoLog.  Carb modified diet  Altered mental status Appears improved.  Patient alert and oriented x 3   Essential hypertension PTA blood pressure regimen   Dyslipidemia Statin on hold   Anxiety and depression PTA BuSpar and Cymbalta   Congestive heart failure (HCC) PTA Toprol and Farxiga  DVT prophylaxis: SQ Lovenox Code Status: DNR Family Communication: None today Disposition Plan: Status is: Inpatient Remains inpatient appropriate because: Acute rhabdomyolysis on IV fluids   Level of care: Telemetry Cardiac  Consultants:  None  Procedures:  None  Antimicrobials: None    Subjective: Seen and examined.  Resting in bed.  Does not endorse pain.  Main complaint is thirst  Objective: Vitals:   09/26/22 2115 09/26/22 2300 09/27/22 0057 09/27/22 0759  BP: (!) 148/82 (!) 159/72 138/81 (!) 155/82  Pulse: 84 61 88 77  Resp: 18 16 20 16   Temp: 98.4 F (36.9 C)  97.8 F (36.6 C) 97.8 F (36.6 C)  TempSrc: Oral  Axillary   SpO2: 100% 100% 94% 100%  Weight:  Intake/Output Summary (Last 24 hours) at 09/27/2022 1022 Last data filed at 09/27/2022 0721 Gross per 24 hour  Intake 1000 ml  Output --  Net 1000 ml   Filed Weights   09/26/22 2111  Weight: 71.4 kg    Examination:  General exam: Appears calm and comfortable  Respiratory system: Clear to auscultation. Respiratory effort normal. Cardiovascular system: S1-S2 2, regular rate, irregular rhythm, no  murmurs, no pedal edema Gastrointestinal system: Soft, NT/ND, normal bowel sounds Central nervous system: Alert and oriented. No focal neurological deficits. Extremities: Decreased power bilateral lower extremities.  Gait not assessed Skin: No rashes, lesions or ulcers Psychiatry: Judgement and insight appear normal. Mood & affect appropriate.     Data Reviewed: I have personally reviewed following labs and imaging studies  CBC: Recent Labs  Lab 09/26/22 2140 09/27/22 0340  WBC 9.1 7.6  NEUTROABS 7.6  --   HGB 14.6 12.7  HCT 44.4 37.2  MCV 91.0 91.0  PLT 173 131*   Basic Metabolic Panel: Recent Labs  Lab 09/26/22 2140 09/27/22 0340  NA 145 146*  K 3.9 3.8  CL 111 116*  CO2 21* 22  GLUCOSE 114* 107*  BUN 67* 62*  CREATININE 1.71* 1.47*  CALCIUM 9.4 8.3*   GFR: Estimated Creatinine Clearance: 24.9 mL/min (A) (by C-G formula based on SCr of 1.47 mg/dL (H)). Liver Function Tests: Recent Labs  Lab 09/26/22 2140  AST 71*  ALT 32  ALKPHOS 59  BILITOT 1.5*  PROT 7.4  ALBUMIN 4.1   No results for input(s): "LIPASE", "AMYLASE" in the last 168 hours. No results for input(s): "AMMONIA" in the last 168 hours. Coagulation Profile: Recent Labs  Lab 09/26/22 2140  INR 1.3*   Cardiac Enzymes: Recent Labs  Lab 09/26/22 2140 09/27/22 0340  CKTOTAL 1,607* 1,003*   BNP (last 3 results) No results for input(s): "PROBNP" in the last 8760 hours. HbA1C: No results for input(s): "HGBA1C" in the last 72 hours. CBG: Recent Labs  Lab 09/26/22 2125  GLUCAP 119*   Lipid Profile: Recent Labs    09/27/22 0340  CHOL 245*  HDL 32*  LDLCALC 174*  TRIG 195*  CHOLHDL 7.7   Thyroid Function Tests: No results for input(s): "TSH", "T4TOTAL", "FREET4", "T3FREE", "THYROIDAB" in the last 72 hours. Anemia Panel: No results for input(s): "VITAMINB12", "FOLATE", "FERRITIN", "TIBC", "IRON", "RETICCTPCT" in the last 72 hours. Sepsis Labs: Recent Labs  Lab 09/26/22 2140  09/26/22 2343  LATICACIDVEN 2.4* 2.1*    Recent Results (from the past 240 hour(s))  Blood culture (routine single)     Status: None (Preliminary result)   Collection Time: 09/26/22  9:19 PM   Specimen: BLOOD  Result Value Ref Range Status   Specimen Description BLOOD BLOOD LEFT HAND  Final   Special Requests   Final    BOTTLES DRAWN AEROBIC AND ANAEROBIC Blood Culture results may not be optimal due to an inadequate volume of blood received in culture bottles   Culture   Final    NO GROWTH < 12 HOURS Performed at [facility], [address], [place], [place] [address]    Report Status PENDING  Incomplete         Radiology Studies: DG Humerus Left  Result Date: 09/26/2022 CLINICAL DATA:  Fall and trauma to the left upper extremity. EXAM: LEFT HUMERUS - 2+ VIEW; LEFT WRIST - COMPLETE 3+ VIEW; LEFT FOREARM - 2 VIEW COMPARISON:  None Available. FINDINGS: There is no acute fracture or dislocation. The  bones are osteopenic. There is degenerative changes of the left AC joint as well as arthritic changes of the base of the thumb. No joint effusion. The soft tissues are unremarkable. IMPRESSION: 1. No acute fracture or dislocation. 2. Osteopenia and degenerative changes. Electronically Signed   By: Elgie Collard M.D.   On: 09/26/2022 22:31   DG Forearm Left  Result Date: 09/26/2022 CLINICAL DATA:  Fall and trauma to the left upper extremity. EXAM: LEFT HUMERUS - 2+ VIEW; LEFT WRIST - COMPLETE 3+ VIEW; LEFT FOREARM - 2 VIEW COMPARISON:  None Available. FINDINGS: There is no acute fracture or dislocation. The bones are osteopenic. There is degenerative changes of the left AC joint as well as arthritic changes of the base of the thumb. No joint effusion. The soft tissues are unremarkable. IMPRESSION: 1. No acute fracture or dislocation. 2. Osteopenia and degenerative changes. Electronically Signed   By: Elgie Collard M.D.   On: 09/26/2022 22:31   DG Wrist Complete Left  Result  Date: 09/26/2022 CLINICAL DATA:  Fall and trauma to the left upper extremity. EXAM: LEFT HUMERUS - 2+ VIEW; LEFT WRIST - COMPLETE 3+ VIEW; LEFT FOREARM - 2 VIEW COMPARISON:  None Available. FINDINGS: There is no acute fracture or dislocation. The bones are osteopenic. There is degenerative changes of the left AC joint as well as arthritic changes of the base of the thumb. No joint effusion. The soft tissues are unremarkable. IMPRESSION: 1. No acute fracture or dislocation. 2. Osteopenia and degenerative changes. Electronically Signed   By: Elgie Collard M.D.   On: 09/26/2022 22:31   DG Chest Port 1 View  Result Date: 09/26/2022 CLINICAL DATA:  Questionable sepsis EXAM: PORTABLE CHEST 1 VIEW COMPARISON:  Chest x-ray 06/22/2022 FINDINGS: The heart size and mediastinal contours are within normal limits. Both lungs are clear. The visualized skeletal structures are unremarkable. IMPRESSION: No active disease. Electronically Signed   By: Darliss Cheney M.D.   On: 09/26/2022 22:28   CT Maxillofacial Wo Contrast  Result Date: 09/26/2022 CLINICAL DATA:  Facial trauma, blunt.  Found down. EXAM: CT MAXILLOFACIAL WITHOUT CONTRAST TECHNIQUE: Multidetector CT imaging of the maxillofacial structures was performed. Multiplanar CT image reconstructions were also generated. RADIATION DOSE REDUCTION: This exam was performed according to the departmental dose-optimization program which includes automated exposure control, adjustment of the mA and/or kV according to patient size and/or use of iterative reconstruction technique. COMPARISON:  None Available. FINDINGS: Osseous: No fracture or mandibular dislocation. No destructive process. Orbits: Negative. No traumatic or inflammatory finding. Sinuses: Clear Soft tissues: Soft tissue swelling in the right face. Limited intracranial: See head CT report IMPRESSION: No facial or orbital fracture. Electronically Signed   By: Charlett Nose M.D.   On: 09/26/2022 22:07   CT Cervical  Spine Wo Contrast  Result Date: 09/26/2022 CLINICAL DATA:  Neck trauma (Age >= 65y).  Found down. EXAM: CT CERVICAL SPINE WITHOUT CONTRAST TECHNIQUE: Multidetector CT imaging of the cervical spine was performed without intravenous contrast. Multiplanar CT image reconstructions were also generated. RADIATION DOSE REDUCTION: This exam was performed according to the departmental dose-optimization program which includes automated exposure control, adjustment of the mA and/or kV according to patient size and/or use of iterative reconstruction technique. COMPARISON:  None Available. FINDINGS: Alignment: Normal Skull base and vertebrae: No acute fracture. No primary bone lesion or focal pathologic process. Soft tissues and spinal canal: No prevertebral fluid or swelling. No visible canal hematoma. Disc levels: Degenerative disc disease with disc space narrowing and  spurring most pronounced in the lower cervical spine. Moderate bilateral degenerative facet disease, left greater than right. Upper chest: No acute findings Other: None IMPRESSION: Degenerative disc and facet disease. No acute bony abnormality. Electronically Signed   By: Charlett Nose M.D.   On: 09/26/2022 22:04   CT Head Wo Contrast  Result Date: 09/26/2022 CLINICAL DATA:  Head trauma, minor (Age >= 65y).  Found down EXAM: CT HEAD WITHOUT CONTRAST TECHNIQUE: Contiguous axial images were obtained from the base of the skull through the vertex without intravenous contrast. RADIATION DOSE REDUCTION: This exam was performed according to the departmental dose-optimization program which includes automated exposure control, adjustment of the mA and/or kV according to patient size and/or use of iterative reconstruction technique. COMPARISON:  None Available. FINDINGS: Brain: There is atrophy and chronic small vessel disease changes. No acute intracranial abnormality. Specifically, no hemorrhage, hydrocephalus, mass lesion, acute infarction, or significant  intracranial injury. Vascular: No hyperdense vessel or unexpected calcification. Skull: No acute calvarial abnormality. Sinuses/Orbits: No acute findings Other: None IMPRESSION: Atrophy, chronic microvascular disease. No acute intracranial abnormality. Electronically Signed   By: Charlett Nose M.D.   On: 09/26/2022 22:03        Scheduled Meds:  acidophilus  1 capsule Oral Daily   amLODipine  5 mg Oral Daily   aspirin EC  81 mg Oral Daily   busPIRone  10 mg Oral BID   cholecalciferol  1,000 Units Oral Daily   clopidogrel  75 mg Oral Daily   DULoxetine  30 mg Oral Daily   enoxaparin (LOVENOX) injection  30 mg Subcutaneous Q24H   hydrOXYzine  10 mg Oral BID   icosapent Ethyl  2 g Oral BID   metoprolol succinate  25 mg Oral Daily   multivitamin with minerals  1 tablet Oral Daily   pregabalin  75 mg Oral BID   Continuous Infusions:  sodium chloride 150 mL/hr at 09/27/22 1014     LOS: 1 day    Tresa Moore, MD [facility]   If 7PM-7AM, please contact night-coverage  09/27/2022, 10:22 AM

## 2022-09-27 NOTE — Assessment & Plan Note (Signed)
-   This is likely prerenal due to volume depletion and dehydration. - The patient will be hydrated with IV normal saline. - We will avoid nephrotoxins. - Will follow BMPs.

## 2022-09-27 NOTE — Plan of Care (Signed)

## 2022-09-27 NOTE — Assessment & Plan Note (Signed)
-   This likely secondary to #1 and 3. - We will monitor mental status. - We will follow neurochecks every 4 hours for 24 hours.

## 2022-09-27 NOTE — Progress Notes (Signed)
Transition of Care [facility]) - Inpatient Brief Assessment   Patient Details  Name: Gail Paul MRN: [id number] Date of Birth: August 17, 1933  Transition of [facility]) CM/SW Contact:    Truddie Hidden, RN Phone Number: 09/27/2022, 9:52 AM   Clinical Narrative: TOC assessing for ongoing needs and discharge planning.   Transition of Care Asessment: Insurance and Status: Insurance coverage has been reviewed Patient has primary care physician: Yes Home environment has been reviewed: Return to home Prior level of function:: Independent Prior/Current Home Services: No current home services Social Determinants of Health Reivew: SDOH reviewed no interventions necessary Readmission risk has been reviewed: Yes Transition of care needs: no transition of care needs at this time

## 2022-09-28 ENCOUNTER — Inpatient Hospital Stay: Payer: Medicare HMO

## 2022-09-28 DIAGNOSIS — T796XXD Traumatic ischemia of muscle, subsequent encounter: Secondary | ICD-10-CM | POA: Diagnosis not present

## 2022-09-28 LAB — CBC WITH DIFFERENTIAL/PLATELET
Abs Immature Granulocytes: 0.04 10*3/uL (ref 0.00–0.07)
Basophils Absolute: 0 10*3/uL (ref 0.0–0.1)
Basophils Relative: 1 %
Eosinophils Absolute: 0.2 10*3/uL (ref 0.0–0.5)
Eosinophils Relative: 4 %
HCT: 36.2 % (ref 36.0–46.0)
Hemoglobin: 12.2 g/dL (ref 12.0–15.0)
Immature Granulocytes: 1 %
Lymphocytes Relative: 18 %
Lymphs Abs: 1.1 10*3/uL (ref 0.7–4.0)
MCH: 30.8 pg (ref 26.0–34.0)
MCHC: 33.7 g/dL (ref 30.0–36.0)
MCV: 91.4 fL (ref 80.0–100.0)
Monocytes Absolute: 0.4 10*3/uL (ref 0.1–1.0)
Monocytes Relative: 6 %
Neutro Abs: 4.5 10*3/uL (ref 1.7–7.7)
Neutrophils Relative %: 70 %
Platelets: 135 10*3/uL — ABNORMAL LOW (ref 150–400)
RBC: 3.96 MIL/uL (ref 3.87–5.11)
RDW: 12.4 % (ref 11.5–15.5)
WBC: 6.3 10*3/uL (ref 4.0–10.5)
nRBC: 0 % (ref 0.0–0.2)

## 2022-09-28 LAB — BASIC METABOLIC PANEL
Anion gap: 7 (ref 5–15)
BUN: 49 mg/dL — ABNORMAL HIGH (ref 8–23)
CO2: 20 mmol/L — ABNORMAL LOW (ref 22–32)
Calcium: 7.7 mg/dL — ABNORMAL LOW (ref 8.9–10.3)
Chloride: 112 mmol/L — ABNORMAL HIGH (ref 98–111)
Creatinine, Ser: 1.34 mg/dL — ABNORMAL HIGH (ref 0.44–1.00)
GFR, Estimated: 38 mL/min — ABNORMAL LOW (ref >60)
Glucose, Bld: 154 mg/dL — ABNORMAL HIGH (ref 70–99)
Potassium: 3.7 mmol/L (ref 3.5–5.1)
Sodium: 139 mmol/L (ref 135–145)

## 2022-09-28 MED ORDER — HYDRALAZINE HCL 20 MG/ML IJ SOLN: 10.0000 mg | INTRAMUSCULAR | Status: DC | PRN

## 2022-09-28 NOTE — Evaluation (Signed)
Occupational Therapy Evaluation Patient Details Name: Gail Paul MRN: [id number] DOB: 02-01-1934 Today's Date: 09/28/2022   History of Present Illness Pt is an 89y/o female presenting with rhabdomyolysis and following a fall at home. PMH includes CHF, CAD, HTN, OA, T2D, Anxiety, and Depression   Clinical Impression   Pt was seen for OT evaluation this date. Prior to hospital admission, pt was living alone and reports generally independent with ADL, driving, light meal prep, and mobility. Pt does not recall fall, just remembers friend who found her. Pt presents to acute OT demonstrating impaired ADL performance and functional mobility 2/2 significant RUE pain, decreased RUE ROM and coordination, impaired balance, strength, and safety awareness (See OT problem list for additional functional deficits). Pt currently requires +2 for bed mobility and standing attempts. Setup assist for self feeding of breakfast. Pt educated in optimal positioning and pillows positioned under RUE to improve comfort, joint protection, and resolve slight R lateral lean. Pt noting improved comfort and access to meal.  Pt would benefit from skilled OT services to address noted impairments and functional limitations (see below for any additional details) in order to maximize safety and independence while minimizing falls risk and caregiver burden.    Recommendations for follow up therapy are one component of a multi-disciplinary discharge planning process, led by the attending physician.  Recommendations may be updated based on patient status, additional functional criteria and insurance authorization.   Assistance Recommended at Discharge Frequent or constant Supervision/Assistance  Patient can return home with the following Two people to help with walking and/or transfers;A lot of help with bathing/dressing/bathroom;Assistance with cooking/housework;Help with stairs or ramp for entrance;Assist for transportation;Direct  supervision/assist for medications management;Direct supervision/assist for financial management    Functional Status Assessment  Patient has had a recent decline in their functional status and demonstrates the ability to make significant improvements in function in a reasonable and predictable amount of time.  Equipment Recommendations  Other (comment) (defer to next venue)    Recommendations for Other Services       Precautions / Restrictions Precautions Precautions: Fall Restrictions Weight Bearing Restrictions: No      Mobility Bed Mobility               General bed mobility comments: deferred, pt eating breakfast, RUE pain, recently up with PT and required +2 MOD-MAX A    Transfers                          Balance                                           ADL either performed or assessed with clinical judgement   ADL Overall ADL's : Needs assistance/impaired Eating/Feeding: Bed level;Set up Eating/Feeding Details (indicate cue type and reason): setup for improved access using non-dom hand     Upper Body Bathing: Maximal assistance   Lower Body Bathing: Maximal assistance   Upper Body Dressing : Maximal assistance   Lower Body Dressing: Maximal assistance   Toilet Transfer: +2 for physical assistance;Moderate assistance;Maximal assistance;BSC/3in1;Stand-pivot;Rolling walker (2 wheels) Toilet Transfer Details (indicate cue type and reason): anticipated assist                 Vision         Perception     Praxis  Pertinent Vitals/Pain Pain Assessment Pain Assessment: 0-10 Pain Score: 7  Pain Location: R shoulder Pain Descriptors / Indicators: Discomfort, Constant, Grimacing Pain Intervention(s): Monitored during session, Repositioned     Hand Dominance Right   Extremity/Trunk Assessment Upper Extremity Assessment Upper Extremity Assessment: Generalized weakness;RUE deficits/detail RUE Deficits /  Details: Pt notes new RUE pain as of today, shoulder ROM limited by pain, endorses whole arm hurts, reports hx of broken wrist requiring surgery ~1ya and still hurts RUE: Shoulder pain with ROM;Unable to fully assess due to pain RUE Sensation: WNL RUE Coordination: decreased fine motor;decreased gross motor   Lower Extremity Assessment Lower Extremity Assessment: Generalized weakness       Communication Communication Communication: No difficulties   Cognition Arousal/Alertness: Awake/alert Behavior During Therapy: WFL for tasks assessed/performed Overall Cognitive Status: Impaired/Different from baseline Area of Impairment: Orientation                 Orientation Level: Disoriented to, Time, Situation             General Comments: cues to redirect to task, tangential in conversation at times     General Comments      Exercises Other Exercises Other Exercises: Pt educated in improved positioning to decreased R lateral lean and to support improved RUE pain/comfort and for joint protection   Shoulder Instructions      Home Living Family/patient expects to be discharged to:: Private residence Living Arrangements: Alone;Other (Comment) (pets (dog and cat)) Available Help at Discharge: Neighbor;Available PRN/intermittently Type of Home: House Home Access: Stairs to enter [employer] of Steps: 4 Entrance Stairs-Rails: Can reach both Home Layout: One level     Bathroom Shower/Tub: Tub/shower unit         Home Equipment: Other (comment) (unable to verbalize current home equiptment, TBD)          Prior Functioning/Environment Prior Level of Function : Independent/Modified Independent             Mobility Comments: Unclear of AD use/ home DME ADLs Comments: Able to perform ADLs indepenently, but notes she would be in pain following them        OT Problem List: Decreased strength;Decreased range of motion;Pain;Decreased safety  awareness;Decreased cognition;Impaired balance (sitting and/or standing);Decreased knowledge of use of DME or AE;Impaired UE functional use      OT Treatment/Interventions: Self-care/ADL training;Therapeutic exercise;Therapeutic activities;DME and/or AE instruction;Patient/family education;Balance training;Manual therapy;Cognitive remediation/compensation    OT Goals(Current goals can be found in the care plan section) Acute Rehab OT Goals Patient Stated Goal: get better OT Goal Formulation: With patient Time For Goal Achievement: 10/12/22 Potential to Achieve Goals: Good ADL Goals Pt Will Perform Upper Body Dressing: sitting;with min assist Pt Will Perform Lower Body Dressing: with max assist;sit to/from stand Pt Will Transfer to Toilet: with max assist;bedside commode;stand pivot transfer (LRAD) Pt Will Perform Toileting - Clothing Manipulation and hygiene: with mod assist;sitting/lateral leans  OT Frequency: Min 1X/week    Co-evaluation              AM-PAC OT "6 Clicks" Daily Activity     Outcome Measure Help from another person eating meals?: None Help from another person taking care of personal grooming?: A Little Help from another person toileting, which includes using toliet, bedpan, or urinal?: Total Help from another person bathing (including washing, rinsing, drying)?: A Lot Help from another person to put on and taking off regular upper body clothing?: A Lot Help from another person to put on and  taking off regular lower body clothing?: A Lot 6 Click Score: 14   End of Session    Activity Tolerance: Patient tolerated treatment well Patient left: in bed;with call bell/phone within reach;with bed alarm set;Other (comment) (chair position)  OT Visit Diagnosis: Other abnormalities of gait and mobility (R26.89);Pain;History of falling (Z91.81) Pain - Right/Left: Right Pain - part of body: Shoulder;Arm;Hand                Time: 2536-6440 OT Time Calculation (min): 24  min Charges:  OT General Charges $OT Visit: 1 Visit OT Evaluation $OT Eval Moderate Complexity: 1 Mod OT Treatments $Self Care/Home Management : 8-22 mins  Arman Filter., MPH, MS, OTR/L ascom [phone] 09/28/22, 1:36 PM

## 2022-09-28 NOTE — Evaluation (Addendum)
Physical Therapy Evaluation Patient Details Name: Gail Paul MRN: [id number] DOB: [birth year] Today's Date: 09/28/2022  History of Present Illness  Pt is an 89y/o female presenting with rhabdomyolysis and following a fall at home. PMH includes CHF, CAD, HTN, OA, T2D, Anxiety, and Depression  Clinical Impression   Pt presents laying in bed, A&Ox2 disoriented to situation and time. She currently lives alone in a single story home with 4 stairs to enter and B/L rails. PTA she was modi/ind with mobility/ADLs, but prior AD use/home DME unclear.   Pt currently in 4/10 pain in R shoulder, resting BP 162/74. Pt attempted to contribute to supine>sit transfer, but required mod->maxAx2 for initiation of movement, trunk control, and guiding LE off/on EOB. Pt also required modAx1 for initial sitting EOB, but able to maintain upright positioning with UE support once bed was flattened. BP in sitting measured 164/106, limiting continued mobility. Pt returned to supine position MaxAx1, resting BP measuring 155/78. Pt left with nursing in the room following PT treatment. Pt also with noted decreased RUE ROM/strength, OT and care team informed. She would benefit from continued skilled therapy to maximize functional abilities.       Assistance Recommended at Discharge Frequent or constant Supervision/Assistance  If plan is discharge home, recommend the following:  Can travel by private vehicle  Direct supervision/assist for financial management;Assistance with cooking/housework;Assist for transportation;Assistance with feeding;Help with stairs or ramp for entrance;Direct supervision/assist for medications management;Two people to help with bathing/dressing/bathroom;Two people to help with walking and/or transfers   No    Equipment Recommendations Other (comment) (TBD)  Recommendations for Other Services       Functional Status Assessment Patient has had a recent decline in their functional status and  demonstrates the ability to make significant improvements in function in a reasonable and predictable amount of time.     Precautions / Restrictions Precautions Precautions: Fall Restrictions Weight Bearing Restrictions: No      Mobility  Bed Mobility Overal bed mobility: Needs Assistance Bed Mobility: Supine to Sit, Sit to Supine     Supine to sit: Max assist, Mod assist, +2 for physical assistance Sit to supine: Max assist   General bed mobility comments: Assistance with trunk control and guiding LE toward EOB. Pt able to move LE laterally in supine but unable to reach EOB. Pt unable to scoot to get feet flat on floor.    Transfers                   General transfer comment: Unsafe to stand due to sitting BP 164/106,    Ambulation/Gait                  Stairs            Wheelchair Mobility     Tilt Bed    Modified Rankin (Stroke Patients Only)       Balance Overall balance assessment: Needs assistance Sitting-balance support: Bilateral upper extremity supported, Feet supported Sitting balance-Leahy Scale: Poor Sitting balance - Comments: Pt required modA to maintain upright with initial sitting. After flattening bed was able to sit with UE support Postural control: Posterior lean                                   Pertinent Vitals/Pain Pain Assessment Pain Assessment: 0-10 Pain Score: 4  Pain Location: R shoulder Pain Descriptors / Indicators: Discomfort, Constant, Grimacing Pain Intervention(s):  Monitored during session    Home Living Family/patient expects to be discharged to:: Private residence Living Arrangements: Alone;Other (Comment) (pets (dog and cat)) Available Help at Discharge: Neighbor;Available PRN/intermittently Type of Home: House Home Access: Stairs to enter Entrance Stairs-Rails: Can reach both Entrance Stairs-Number of Steps: 4   Home Layout: One level Home Equipment: Other (comment) (unable to  verbalize current home equiptment, TBD)      Prior Function Prior Level of Function : Independent/Modified Independent               ADLs Comments: Able to perform ADLs indepenently, but notes she would be in pain following them     Hand Dominance   Dominant Hand: Right    Extremity/Trunk Assessment   Upper Extremity Assessment Upper Extremity Assessment: Generalized weakness;RUE deficits/detail RUE Deficits / Details: RUE pain limiting ROM and difficulty with pushing off bed to assist with scooting RUE: Shoulder pain with ROM;Unable to fully assess due to pain RUE Sensation: WNL RUE Coordination: decreased fine motor;decreased gross motor    Lower Extremity Assessment Lower Extremity Assessment: Generalized weakness       Communication   Communication: No difficulties  Cognition Arousal/Alertness: Awake/alert Behavior During Therapy: WFL for tasks assessed/performed Overall Cognitive Status: Impaired/Different from baseline Area of Impairment: Orientation                 Orientation Level: Disoriented to, Time, Situation             General Comments: Did not recall having fall or reason for admission when asked but mentioned having a fall later in the session        General Comments General comments (skin integrity, edema, etc.): BP in sitting: 164/106, BP when returning to supine: 155/78    Exercises     Assessment/Plan    PT Assessment Patient needs continued PT services  PT Problem List Decreased strength;Decreased range of motion;Decreased activity tolerance;Decreased balance;Decreased mobility;Decreased safety awareness;Decreased knowledge of use of DME       PT Treatment Interventions DME instruction;Therapeutic exercise;Gait training;Balance training;Stair training;Functional mobility training;Therapeutic activities;Patient/family education    PT Goals (Current goals can be found in the Care Plan section)  Acute Rehab PT Goals Patient  Stated Goal: return home PT Goal Formulation: With patient Time For Goal Achievement: 10/12/22 Potential to Achieve Goals: Fair    Frequency Min 1X/week     Co-evaluation               AM-PAC PT "6 Clicks" Mobility  Outcome Measure Help needed turning from your back to your side while in a flat bed without using bedrails?: A Lot Help needed moving from lying on your back to sitting on the side of a flat bed without using bedrails?: A Lot Help needed moving to and from a bed to a chair (including a wheelchair)?: Total Help needed standing up from a chair using your arms (e.g., wheelchair or bedside chair)?: Total Help needed to walk in hospital room?: Total Help needed climbing 3-5 steps with a railing? : Total 6 Click Score: 8    End of Session   Activity Tolerance: Other (comment);Patient limited by pain (Limited by BP) Patient left: in bed;with call bell/phone within reach;with bed alarm set;with nursing/sitter in room   PT Visit Diagnosis: Other abnormalities of gait and mobility (R26.89);Repeated falls (R29.6);Difficulty in walking, not elsewhere classified (R26.2);Muscle weakness (generalized) (M62.81)    Time: 4098-1191 PT Time Calculation (min) (ACUTE ONLY): 27 min   Charges:  PT Evaluation $PT Eval Low Complexity: 1 Low PT Treatments $Therapeutic Activity: 23-37 mins PT General Charges $$ ACUTE PT VISIT: 1 Visit        Verle Wheeling, PT, SPT 11:39 AM,09/28/22

## 2022-09-28 NOTE — TOC Progression Note (Signed)
Transition of Care [facility]) - Progression Note    Patient Details  Name: Gail Paul MRN: [id number] Date of Birth: 04-19-1933  Transition of Care [facility]) CM/SW Contact  Truddie Hidden, RN Phone Number: 09/28/2022, 4:03 PM  Clinical Narrative:    Attempt to speak with patient at bedside regarding therapy's recommendation for SNF. Patient was sleeping.        Expected Discharge Plan and Services                                               Social Determinants of Health (SDOH) Interventions SDOH Screenings   Tobacco Use: Low Risk  (09/22/2022)    Readmission Risk Interventions     No data to display

## 2022-09-28 NOTE — Progress Notes (Signed)
PROGRESS NOTE    Gail Paul  ZOX:[id number] DOB: 1933-08-27 DOA: 09/26/2022 PCP: Margaretann Loveless, MD    Brief Narrative:  89 y.o. Caucasian female with medical history significant for CHF, coronary artery disease, hypertension and osteoarthritis, who presented to the emergency room with acute onset of fall.  The patient was last heard from 3 days ago.  She lives alone.  Her friend was checking on her today and found her facedown on the ground.  She was complaining of pain in the upper back and the right side of her face.  She does not recall a fall or why she ended up on the ground.  She was not oriented to the day but was oriented to person, place and year.  No reported chest pain or palpitations.  No report cough or wheezing or hemoptysis.  No reported abdominal pain or nausea or vomiting or diarrhea.  No dysuria, oliguria, urinary frequency or urgency or flank pain.  The patient was fairly asleep during my interview no further history could be obtained.   7/25: Troponins trending down.  CK trending down.   Assessment & Plan:   Principal Problem:   Rhabdomyolysis Active Problems:   Elevated troponin   Type 2 diabetes mellitus with peripheral neuropathy (HCC)   Acute kidney injury superimposed on chronic kidney disease (HCC)   Essential hypertension   Altered mental status   Congestive heart failure (HCC)   Anxiety and depression   Dyslipidemia  Traumatic rhabdomyolysis Likely secondary to extended time down.  Could be secondary to a fall associated with syncope.  Unclear etiology.  CK trending down. Plan: Continue IVF, reduce rate rate Serum CK in a.m. DC telemetry Therapy evaluation TOC consult for placement     Elevated troponin Suspect this is related to supply/demand ischemia in the setting of acute rhabdomyolysis from extended time down.  Patient not having chest pain.  No EKG changes just ACS.  Troponins now downtrending. Plan: DC telemetry Continue beta-blocker,  aspirin, Plavix Outpatient cardiology follow-up   Acute kidney injury Unable to completely rule in chronic kidney disease.  Baseline creatinine from 2017 appears to be normal.  Suspect this is acute kidney injury in the setting of hypovolemia/dehydration from extended time down.  Creatinine downtrending Plan: Aggressive IVF Daily BMP  Type 2 diabetes mellitus with peripheral neuropathy (HCC) PTA Farxiga and Lyrica continued.   Supplemental coverage with NovoLog.   Carb modified diet  Altered mental status Appears improved.   Patient alert and oriented x 3   Essential hypertension PTA blood pressure regimen   Dyslipidemia Statin on hold   Anxiety and depression PTA BuSpar and Cymbalta   Congestive heart failure (HCC) PTA Toprol and Farxiga  DVT prophylaxis: SQ Lovenox Code Status: DNR Family Communication: None today Disposition Plan: Status is: Inpatient Remains inpatient appropriate because: Acute rhabdomyolysis on IV fluids   Level of care: Telemetry Cardiac  Consultants:  None  Procedures:  None  Antimicrobials: None    Subjective: Seen and examined.  Today complains of pain in right arm.  Objective: Vitals:   09/28/22 0751 09/28/22 0935 09/28/22 0942 09/28/22 1128  BP: (!) 171/115 (!) 162/74  (!) 146/111  Pulse: (!) 52  60 71  Resp: 18   20  Temp: 98.1 F (36.7 C)   (!) 97.4 F (36.3 C)  TempSrc: Oral     SpO2: 99%   97%  Weight:        Intake/Output Summary (Last 24 hours) at 09/28/2022  1132 Last data filed at 09/27/2022 2100 Gross per 24 hour  Intake 2370.58 ml  Output --  Net 2370.58 ml   Filed Weights   09/26/22 2111  Weight: 71.4 kg    Examination:  General exam: NAD Respiratory system: Lungs clear.  Normal work of breathing.  Room air Cardiovascular system: S1-S2, regular rate, irregular rhythm rhythm, no murmurs, no pedal edema Gastrointestinal system: Soft, NT/ND, normal bowel sounds Central nervous system: Alert and  oriented. No focal neurological deficits. Extremities: Decreased power bilateral lower extremities.  Gait not assessed.  Right upper extremity decreased ROM.  Pain on palpation Skin: No rashes, lesions or ulcers Psychiatry: Judgement and insight appear normal. Mood & affect appropriate.     Data Reviewed: I have personally reviewed following labs and imaging studies  CBC: Recent Labs  Lab 09/26/22 2140 09/27/22 0340 09/28/22 1023  WBC 9.1 7.6 6.3  NEUTROABS 7.6  --  4.5  HGB 14.6 12.7 12.2  HCT 44.4 37.2 36.2  MCV 91.0 91.0 91.4  PLT 173 131* 135*   Basic Metabolic Panel: Recent Labs  Lab 09/26/22 2140 09/27/22 0340 09/28/22 1023  NA 145 146* 139  K 3.9 3.8 3.7  CL 111 116* 112*  CO2 21* 22 20*  GLUCOSE 114* 107* 154*  BUN 67* 62* 49*  CREATININE 1.71* 1.47* 1.34*  CALCIUM 9.4 8.3* 7.7*   GFR: Estimated Creatinine Clearance: 27.4 mL/min (A) (by C-G formula based on SCr of 1.34 mg/dL (H)). Liver Function Tests: Recent Labs  Lab 09/26/22 2140  AST 71*  ALT 32  ALKPHOS 59  BILITOT 1.5*  PROT 7.4  ALBUMIN 4.1   No results for input(s): "LIPASE", "AMYLASE" in the last 168 hours. No results for input(s): "AMMONIA" in the last 168 hours. Coagulation Profile: Recent Labs  Lab 09/26/22 2140  INR 1.3*   Cardiac Enzymes: Recent Labs  Lab 09/26/22 2140 09/27/22 0340 09/28/22 0423  CKTOTAL 1,607* 1,003* 797*   BNP (last 3 results) No results for input(s): "PROBNP" in the last 8760 hours. HbA1C: No results for input(s): "HGBA1C" in the last 72 hours. CBG: Recent Labs  Lab 09/26/22 2125  GLUCAP 119*   Lipid Profile: Recent Labs    09/27/22 0340  CHOL 245*  HDL 32*  LDLCALC 174*  TRIG 195*  CHOLHDL 7.7   Thyroid Function Tests: No results for input(s): "TSH", "T4TOTAL", "FREET4", "T3FREE", "THYROIDAB" in the last 72 hours. Anemia Panel: No results for input(s): "VITAMINB12", "FOLATE", "FERRITIN", "TIBC", "IRON", "RETICCTPCT" in the last 72  hours. Sepsis Labs: Recent Labs  Lab 09/26/22 2140 09/26/22 2343  LATICACIDVEN 2.4* 2.1*    Recent Results (from the past 240 hour(s))  Blood culture (routine single)     Status: None (Preliminary result)   Collection Time: 09/26/22  9:19 PM   Specimen: BLOOD  Result Value Ref Range Status   Specimen Description BLOOD BLOOD LEFT HAND  Final   Special Requests   Final    BOTTLES DRAWN AEROBIC AND ANAEROBIC Blood Culture results may not be optimal due to an inadequate volume of blood received in culture bottles   Culture   Final    NO GROWTH 2 DAYS Performed at [facility], [address], [place], [place] [address]    Report Status PENDING  Incomplete         Radiology Studies: DG Humerus Left  Result Date: 09/26/2022 CLINICAL DATA:  Fall and trauma to the left upper extremity. EXAM: LEFT HUMERUS - 2+ VIEW; LEFT  WRIST - COMPLETE 3+ VIEW; LEFT FOREARM - 2 VIEW COMPARISON:  None Available. FINDINGS: There is no acute fracture or dislocation. The bones are osteopenic. There is degenerative changes of the left AC joint as well as arthritic changes of the base of the thumb. No joint effusion. The soft tissues are unremarkable. IMPRESSION: 1. No acute fracture or dislocation. 2. Osteopenia and degenerative changes. Electronically Signed   By: Elgie Collard M.D.   On: 09/26/2022 22:31   DG Forearm Left  Result Date: 09/26/2022 CLINICAL DATA:  Fall and trauma to the left upper extremity. EXAM: LEFT HUMERUS - 2+ VIEW; LEFT WRIST - COMPLETE 3+ VIEW; LEFT FOREARM - 2 VIEW COMPARISON:  None Available. FINDINGS: There is no acute fracture or dislocation. The bones are osteopenic. There is degenerative changes of the left AC joint as well as arthritic changes of the base of the thumb. No joint effusion. The soft tissues are unremarkable. IMPRESSION: 1. No acute fracture or dislocation. 2. Osteopenia and degenerative changes. Electronically Signed   By: Elgie Collard M.D.   On:  09/26/2022 22:31   DG Wrist Complete Left  Result Date: 09/26/2022 CLINICAL DATA:  Fall and trauma to the left upper extremity. EXAM: LEFT HUMERUS - 2+ VIEW; LEFT WRIST - COMPLETE 3+ VIEW; LEFT FOREARM - 2 VIEW COMPARISON:  None Available. FINDINGS: There is no acute fracture or dislocation. The bones are osteopenic. There is degenerative changes of the left AC joint as well as arthritic changes of the base of the thumb. No joint effusion. The soft tissues are unremarkable. IMPRESSION: 1. No acute fracture or dislocation. 2. Osteopenia and degenerative changes. Electronically Signed   By: Elgie Collard M.D.   On: 09/26/2022 22:31   DG Chest Port 1 View  Result Date: 09/26/2022 CLINICAL DATA:  Questionable sepsis EXAM: PORTABLE CHEST 1 VIEW COMPARISON:  Chest x-ray 06/22/2022 FINDINGS: The heart size and mediastinal contours are within normal limits. Both lungs are clear. The visualized skeletal structures are unremarkable. IMPRESSION: No active disease. Electronically Signed   By: Darliss Cheney M.D.   On: 09/26/2022 22:28   CT Maxillofacial Wo Contrast  Result Date: 09/26/2022 CLINICAL DATA:  Facial trauma, blunt.  Found down. EXAM: CT MAXILLOFACIAL WITHOUT CONTRAST TECHNIQUE: Multidetector CT imaging of the maxillofacial structures was performed. Multiplanar CT image reconstructions were also generated. RADIATION DOSE REDUCTION: This exam was performed according to the departmental dose-optimization program which includes automated exposure control, adjustment of the mA and/or kV according to patient size and/or use of iterative reconstruction technique. COMPARISON:  None Available. FINDINGS: Osseous: No fracture or mandibular dislocation. No destructive process. Orbits: Negative. No traumatic or inflammatory finding. Sinuses: Clear Soft tissues: Soft tissue swelling in the right face. Limited intracranial: See head CT report IMPRESSION: No facial or orbital fracture. Electronically Signed   By:  Charlett Nose M.D.   On: 09/26/2022 22:07   CT Cervical Spine Wo Contrast  Result Date: 09/26/2022 CLINICAL DATA:  Neck trauma (Age >= 65y).  Found down. EXAM: CT CERVICAL SPINE WITHOUT CONTRAST TECHNIQUE: Multidetector CT imaging of the cervical spine was performed without intravenous contrast. Multiplanar CT image reconstructions were also generated. RADIATION DOSE REDUCTION: This exam was performed according to the departmental dose-optimization program which includes automated exposure control, adjustment of the mA and/or kV according to patient size and/or use of iterative reconstruction technique. COMPARISON:  None Available. FINDINGS: Alignment: Normal Skull base and vertebrae: No acute fracture. No primary bone lesion or focal pathologic process. Soft  tissues and spinal canal: No prevertebral fluid or swelling. No visible canal hematoma. Disc levels: Degenerative disc disease with disc space narrowing and spurring most pronounced in the lower cervical spine. Moderate bilateral degenerative facet disease, left greater than right. Upper chest: No acute findings Other: None IMPRESSION: Degenerative disc and facet disease. No acute bony abnormality. Electronically Signed   By: Charlett Nose M.D.   On: 09/26/2022 22:04   CT Head Wo Contrast  Result Date: 09/26/2022 CLINICAL DATA:  Head trauma, minor (Age >= 65y).  Found down EXAM: CT HEAD WITHOUT CONTRAST TECHNIQUE: Contiguous axial images were obtained from the base of the skull through the vertex without intravenous contrast. RADIATION DOSE REDUCTION: This exam was performed according to the departmental dose-optimization program which includes automated exposure control, adjustment of the mA and/or kV according to patient size and/or use of iterative reconstruction technique. COMPARISON:  None Available. FINDINGS: Brain: There is atrophy and chronic small vessel disease changes. No acute intracranial abnormality. Specifically, no hemorrhage,  hydrocephalus, mass lesion, acute infarction, or significant intracranial injury. Vascular: No hyperdense vessel or unexpected calcification. Skull: No acute calvarial abnormality. Sinuses/Orbits: No acute findings Other: None IMPRESSION: Atrophy, chronic microvascular disease. No acute intracranial abnormality. Electronically Signed   By: Charlett Nose M.D.   On: 09/26/2022 22:03        Scheduled Meds:  acidophilus  1 capsule Oral Daily   amLODipine  5 mg Oral Daily   aspirin EC  81 mg Oral Daily   busPIRone  10 mg Oral BID   cholecalciferol  1,000 Units Oral Daily   clopidogrel  75 mg Oral Daily   DULoxetine  30 mg Oral Daily   enoxaparin (LOVENOX) injection  30 mg Subcutaneous Q24H   hydrOXYzine  10 mg Oral BID   icosapent Ethyl  2 g Oral BID   metoprolol succinate  25 mg Oral Daily   multivitamin with minerals  1 tablet Oral Daily   pregabalin  75 mg Oral BID   Continuous Infusions:  sodium chloride 75 mL/hr at 09/28/22 0820     LOS: 2 days    Tresa Moore, MD [facility]   If 7PM-7AM, please contact night-coverage  09/28/2022, 11:32 AM

## 2022-09-29 DIAGNOSIS — T796XXD Traumatic ischemia of muscle, subsequent encounter: Secondary | ICD-10-CM | POA: Diagnosis not present

## 2022-09-29 NOTE — Plan of Care (Signed)
  Problem: Education: Goal: Knowledge of General Education information will improve Description: Including pain rating scale, medication(s)/side effects and non-pharmacologic comfort measures Outcome: Progressing   Problem: Activity: Goal: Risk for activity intolerance will decrease Outcome: Progressing   Problem: Clinical Measurements: Goal: Cardiovascular complication will be avoided Outcome: Progressing   Problem: Clinical Measurements: Goal: Respiratory complications will improve Outcome: Progressing   Problem: Safety: Goal: Ability to remain free from injury will improve Outcome: Progressing   Problem: Pain Managment: Goal: General experience of comfort will improve Outcome: Progressing   Problem: Skin Integrity: Goal: Risk for impaired skin integrity will decrease Outcome: Progressing

## 2022-09-29 NOTE — TOC Transition Note (Deleted)
Transition of Care [facility]) - CM/SW Discharge Note   Patient Details  Name: Gail Paul MRN: [id number] Date of Birth: 1934-02-27  [facility] CM/SW Contact:  Bing Quarry, RN Phone Number: 09/29/2022, 1:34 PM   Clinical Narrative: 7/27: Admitted via [facility] ED after found down after not being heard from for 3 days. Checked on by friend as patient lives alone, who found face down on 09/26/22.  Patient had no memory of fall or what happened. Significant PMH which includes CHF, CAD, HTN, OA, T2D, Anxiety, and Depression. Current Dx of Rhabdomyolysis, elevated troponin levels, and, AKI. Rhabdomyolysis is now resolved as of today per attending provider. Therapy recommending STR post acute discharge. Patient was too sleepy/some disorientation present when prior CM attempted to speak with patient regarding STR/SNF recommendations and choices if agreeable. Unit RN Assessment indicates patient still not fully oriented to situation as of am assessment today. Patient has friends as contacts in chart, and no listed HCPOA or active DPR, listed as a DNR but no ACP documents on record per EPIC story board. Will attempt to speak to patient today regarding discharge plan.  Gabriel Cirri MSN RN CM  Transitions of Care Department [facility] [phone] Weekends Only          Patient Goals and CMS Choice      Discharge Placement                         Discharge Plan and Services Additional resources added to the After Visit Summary for                                       Social Determinants of Health (SDOH) Interventions SDOH Screenings   Tobacco Use: Low Risk  (09/22/2022)     Readmission Risk Interventions     No data to display

## 2022-09-29 NOTE — Progress Notes (Signed)
PROGRESS NOTE    Gail Paul  ZOX:[id number] DOB: 1933-06-13 DOA: 09/26/2022 PCP: Margaretann Loveless, MD    Brief Narrative:  89 y.o. Caucasian female with medical history significant for CHF, coronary artery disease, hypertension and osteoarthritis, who presented to the emergency room with acute onset of fall.  The patient was last heard from 3 days ago.  She lives alone.  Her friend was checking on her today and found her facedown on the ground.  She was complaining of pain in the upper back and the right side of her face.  She does not recall a fall or why she ended up on the ground.  She was not oriented to the day but was oriented to person, place and year.  No reported chest pain or palpitations.  No report cough or wheezing or hemoptysis.  No reported abdominal pain or nausea or vomiting or diarrhea.  No dysuria, oliguria, urinary frequency or urgency or flank pain.  The patient was fairly asleep during my interview no further history could be obtained.   7/25: Troponins trending down.  CK trending down. 7/27: Rhabdomyolysis resolved.  RUE xray survey negative   Assessment & Plan:   Principal Problem:   Rhabdomyolysis Active Problems:   Elevated troponin   Type 2 diabetes mellitus with peripheral neuropathy (HCC)   Acute kidney injury superimposed on chronic kidney disease (HCC)   Essential hypertension   Altered mental status   Congestive heart failure (HCC)   Anxiety and depression   Dyslipidemia  Traumatic rhabdomyolysis Likely secondary to extended time down.  Could be secondary to a fall associated with syncope.  Unclear etiology.  CK trending down. Plan: DC IVF Continue therapy as tolerated Medically ready for discharge to skilled nursing facility     Elevated troponin Suspect this is related to supply/demand ischemia in the setting of acute rhabdomyolysis from extended time down.  Patient not having chest pain.  No EKG changes just ACS.  Troponins now  downtrending. Plan: Continue beta-blocker, aspirin, Plavix Outpatient cardiology follow-up   Acute kidney injury Unable to completely rule in chronic kidney disease.  Baseline creatinine from 2017 appears to be normal.  Suspect this is acute kidney injury in the setting of hypovolemia/dehydration from extended time down.  Creatinine downtrending Plan: Creatinine has plateaued.  DC IVF.  Encourage p.o. fluid intake  Type 2 diabetes mellitus with peripheral neuropathy (HCC) PTA Farxiga and Lyrica continued.   Supplemental coverage with NovoLog.   Carb modified diet  Altered mental status Appears improved.   Patient alert and oriented x 3   Essential hypertension PTA blood pressure regimen   Dyslipidemia Statin on hold   Anxiety and depression PTA BuSpar and Cymbalta   Congestive heart failure (HCC) PTA Toprol and Farxiga  DVT prophylaxis: SQ Lovenox Code Status: DNR Family Communication: None today Disposition Plan: Status is: Inpatient Remains inpatient appropriate because: Unsafe discharge plan.  Medically ready for discharge to skilled nursing facility.   Level of care: Telemetry Cardiac  Consultants:  None  Procedures:  None  Antimicrobials: None    Subjective: Seen and examined.  No complaints this morning. Objective: Vitals:   09/28/22 1936 09/28/22 2341 09/29/22 0306 09/29/22 0740  BP: (!) 116/54 (!) 102/51 126/64 118/65  Pulse: (!) 55 (!) 51 (!) 52 (!) 49  Resp: 18 18 18 18   Temp: 98.3 F (36.8 C) 98.2 F (36.8 C) 98 F (36.7 C) 98 F (36.7 C)  TempSrc: Oral     SpO2:  97% 96% 98% 98%  Weight:        Intake/Output Summary (Last 24 hours) at 09/29/2022 1246 Last data filed at 09/28/2022 1925 Gross per 24 hour  Intake 0 ml  Output 500 ml  Net -500 ml   Filed Weights   09/26/22 2111  Weight: 71.4 kg    Examination:  General exam: NAD Respiratory system: Lungs clear.  Normal work of breathing.  Room air Cardiovascular system: S1 S2,  regular rate, regular rhythm, no murmurs Gastrointestinal system: Soft, NT/ND, normal bowel sounds Central nervous system: Alert and oriented. No focal neurological deficits. Extremities: Decreased power bilateral lower extremities.  Gait not assessed.  Right upper extremity decreased ROM.  Pain on palpation Skin: No rashes, lesions or ulcers Psychiatry: Judgement and insight appear normal. Mood & affect appropriate.     Data Reviewed: I have personally reviewed following labs and imaging studies  CBC: Recent Labs  Lab 09/26/22 2140 09/27/22 0340 09/28/22 1023  WBC 9.1 7.6 6.3  NEUTROABS 7.6  --  4.5  HGB 14.6 12.7 12.2  HCT 44.4 37.2 36.2  MCV 91.0 91.0 91.4  PLT 173 131* 135*   Basic Metabolic Panel: Recent Labs  Lab 09/26/22 2140 09/27/22 0340 09/28/22 1023 09/29/22 0410  NA 145 146* 139 138  K 3.9 3.8 3.7 3.9  CL 111 116* 112* 114*  CO2 21* 22 20* 20*  GLUCOSE 114* 107* 154* 102*  BUN 67* 62* 49* 47*  CREATININE 1.71* 1.47* 1.34* 1.38*  CALCIUM 9.4 8.3* 7.7* 7.5*   GFR: Estimated Creatinine Clearance: 26.6 mL/min (A) (by C-G formula based on SCr of 1.38 mg/dL (H)). Liver Function Tests: Recent Labs  Lab 09/26/22 2140  AST 71*  ALT 32  ALKPHOS 59  BILITOT 1.5*  PROT 7.4  ALBUMIN 4.1   No results for input(s): "LIPASE", "AMYLASE" in the last 168 hours. No results for input(s): "AMMONIA" in the last 168 hours. Coagulation Profile: Recent Labs  Lab 09/26/22 2140  INR 1.3*   Cardiac Enzymes: Recent Labs  Lab 09/26/22 2140 09/27/22 0340 09/28/22 0423 09/29/22 0410  CKTOTAL 1,607* 1,003* 797* 371*   BNP (last 3 results) No results for input(s): "PROBNP" in the last 8760 hours. HbA1C: No results for input(s): "HGBA1C" in the last 72 hours. CBG: Recent Labs  Lab 09/26/22 2125  GLUCAP 119*   Lipid Profile: Recent Labs    09/27/22 0340  CHOL 245*  HDL 32*  LDLCALC 174*  TRIG 195*  CHOLHDL 7.7   Thyroid Function Tests: No results for  input(s): "TSH", "T4TOTAL", "FREET4", "T3FREE", "THYROIDAB" in the last 72 hours. Anemia Panel: No results for input(s): "VITAMINB12", "FOLATE", "FERRITIN", "TIBC", "IRON", "RETICCTPCT" in the last 72 hours. Sepsis Labs: Recent Labs  Lab 09/26/22 2140 09/26/22 2343  LATICACIDVEN 2.4* 2.1*    Recent Results (from the past 240 hour(s))  Blood culture (routine single)     Status: None (Preliminary result)   Collection Time: 09/26/22  9:19 PM   Specimen: BLOOD  Result Value Ref Range Status   Specimen Description BLOOD BLOOD LEFT HAND  Final   Special Requests   Final    BOTTLES DRAWN AEROBIC AND ANAEROBIC Blood Culture results may not be optimal due to an inadequate volume of blood received in culture bottles   Culture   Final    NO GROWTH 3 DAYS Performed at [facility], [address], [place], [place] [address]    Report Status PENDING  Incomplete  Radiology Studies: DG Wrist 2 Views Right  Result Date: 09/28/2022 CLINICAL DATA:  History of prior wrist surgery with pain following fall, initial encounter EXAM: RIGHT WRIST - 2 VIEW COMPARISON:  None Available. FINDINGS: The proximal carpal row has been surgically removed with the exception of the pisiform. Distal radius and ulna appear within normal limits. Degenerative changes at the first [facility] joint are noted. No other focal abnormality is noted. IMPRESSION: Postsurgical changes with removal of the majority of the proximal carpal row. No acute abnormality noted. Electronically Signed   By: Alcide Clever M.D.   On: 09/28/2022 15:06   DG Forearm Right  Result Date: 09/28/2022 CLINICAL DATA:  Recent fall with right arm pain, initial encounter EXAM: RIGHT FOREARM - 2 VIEW COMPARISON:  None FINDINGS: No acute fracture or dislocation in the radius or ulna is seen. Postsurgical changes are noted in the wrist with removal of the first carpal row. This is better visualized on recent wrist films. No soft tissue abnormality  is noted. IMPRESSION: No acute abnormality noted. Electronically Signed   By: Alcide Clever M.D.   On: 09/28/2022 15:05   DG Humerus Right  Result Date: 09/28/2022 CLINICAL DATA:  Right upper arm pain, no known injury, initial encounter EXAM: RIGHT HUMERUS - 2+ VIEW COMPARISON:  None Available. FINDINGS: Degenerative changes are noted in the acromioclavicular and glenohumeral joints. No acute fracture or dislocation is seen. Humeral head is high-riding likely related to chronic rotator cuff injury. No soft tissue abnormality is noted. IMPRESSION: Chronic changes without acute abnormality. Electronically Signed   By: Alcide Clever M.D.   On: 09/28/2022 15:03        Scheduled Meds:  acidophilus  1 capsule Oral Daily   amLODipine  5 mg Oral Daily   aspirin EC  81 mg Oral Daily   busPIRone  10 mg Oral BID   cholecalciferol  1,000 Units Oral Daily   clopidogrel  75 mg Oral Daily   DULoxetine  30 mg Oral Daily   enoxaparin (LOVENOX) injection  30 mg Subcutaneous Q24H   hydrOXYzine  10 mg Oral BID   icosapent Ethyl  2 g Oral BID   metoprolol succinate  25 mg Oral Daily   multivitamin with minerals  1 tablet Oral Daily   pregabalin  75 mg Oral BID   Continuous Infusions:     LOS: 3 days    Tresa Moore, MD [facility]   If 7PM-7AM, please contact night-coverage  09/29/2022, 12:46 PM

## 2022-09-29 NOTE — TOC Progression Note (Addendum)
Transition of Care [facility]) - Progression Note    Patient Details  Name: Gail Paul MRN: [id number] Date of Birth: 05/18/1933  [facility]) CM/SW Contact  Bing Quarry, RN Phone Number: 09/29/2022, 3:23 PM  Clinical Narrative:   7/27: Admitted via [facility] ED after found down after not being heard from for 3 days. Checked on by friend as patient lives alone, who found face down on 09/26/22.  Patient had no memory of fall or what happened. Significant PMH which includes CHF, CAD, HTN, OA, T2D, Anxiety, and Depression. Current Dx of Rhabdomyolysis, elevated troponin levels, and, AKI. Rhabdomyolysis is now resolved as of today per attending provider. Therapy recommending STR post acute discharge. Patient was too sleepy/some disorientation present when prior CM attempted to speak with patient regarding STR/SNF recommendations and choices if agreeable. Unit RN Assessment indicates patient still not fully oriented to situation as of am assessment today. Patient has friends as contacts in chart, and no listed HCPOA or active DPR, listed as a DNR but no ACP documents on record per EPIC story board. Will attempt to speak to patient today regarding discharge plan.  Gabriel Cirri MSN RN CM  Transitions of Care Department [facility] [phone] Weekends Only    UPDATE: 330 pm. Spoke with patient. She has not been at a facility before. Has no family near by since her mother died. Friends listed are out of town. Discussed therapy recommendations and she had not preferences except to stay local to [facility] area. Medicare.gov list provided for her to review for choices or places she does not wish TOC to submit a bed search to. Will follow up later today or in the am and start bed search.   Gabriel Cirri MSN RN CM  Transitions of Care Department [facility] [phone] Weekends Only         Expected Discharge Plan and Services                                                Social Determinants of Health (SDOH) Interventions SDOH Screenings   Tobacco Use: Low Risk  (09/22/2022)    Readmission Risk Interventions     No data to display

## 2022-09-30 DIAGNOSIS — T796XXD Traumatic ischemia of muscle, subsequent encounter: Secondary | ICD-10-CM | POA: Diagnosis not present

## 2022-09-30 MED ORDER — ORAL CARE MOUTH RINSE: 15.0000 mL | OROMUCOSAL | Status: DC | PRN

## 2022-09-30 NOTE — Progress Notes (Signed)
PROGRESS NOTE    Gail Paul  ZOX:[id number] DOB: 07/22/1933 DOA: 09/26/2022 PCP: Margaretann Loveless, MD    Brief Narrative:  89 y.o. Caucasian female with medical history significant for CHF, coronary artery disease, hypertension and osteoarthritis, who presented to the emergency room with acute onset of fall.  The patient was last heard from 3 days ago.  She lives alone.  Her friend was checking on her today and found her facedown on the ground.  She was complaining of pain in the upper back and the right side of her face.  She does not recall a fall or why she ended up on the ground.  She was not oriented to the day but was oriented to person, place and year.  No reported chest pain or palpitations.  No report cough or wheezing or hemoptysis.  No reported abdominal pain or nausea or vomiting or diarrhea.  No dysuria, oliguria, urinary frequency or urgency or flank pain.  The patient was fairly asleep during my interview no further history could be obtained.   7/25: Troponins trending down.  CK trending down. 7/27: Rhabdomyolysis resolved.  RUE xray survey negative 7/28: Medical ready for discharge   Assessment & Plan:   Principal Problem:   Rhabdomyolysis Active Problems:   Elevated troponin   Type 2 diabetes mellitus with peripheral neuropathy (HCC)   Acute kidney injury superimposed on chronic kidney disease (HCC)   Essential hypertension   Altered mental status   Congestive heart failure (HCC)   Anxiety and depression   Dyslipidemia  Traumatic rhabdomyolysis Likely secondary to extended time down.  Could be secondary to a fall associated with syncope.  Unclear etiology.  CK trending down. Plan: Continue PT and OT as tolerated Medically ready for or skilled nursing facility     Elevated troponin Suspect this is related to supply/demand ischemia in the setting of acute rhabdomyolysis from extended time down.  Patient not having chest pain.  No EKG changes just ACS.  Troponins  now downtrending. Plan: Continue beta-blocker, aspirin, Plavix Outpatient cardiology follow-up   Acute kidney injury Unable to completely rule in chronic kidney disease.  Baseline creatinine from 2017 appears to be normal.  Suspect this is acute kidney injury in the setting of hypovolemia/dehydration from extended time down.  Creatinine downtrending Plan: Creatinine has plateaued.   No indication for further IVF  Type 2 diabetes mellitus with peripheral neuropathy (HCC) PTA Farxiga and Lyrica continued.   Supplemental coverage with NovoLog.   Carb modified diet  Altered mental status Appears improved.   Patient alert and oriented x 3   Essential hypertension PTA blood pressure regimen   Dyslipidemia Statin on hold   Anxiety and depression PTA BuSpar and Cymbalta   Congestive heart failure (HCC) PTA Toprol and Farxiga  DVT prophylaxis: SQ Lovenox Code Status: DNR Family Communication: None today Disposition Plan: Status is: Inpatient Remains inpatient appropriate because: Unsafe discharge plan.  Medically ready for discharge to skilled nursing facility.   Level of care: Telemetry Cardiac  Consultants:  None  Procedures:  None  Antimicrobials: None    Subjective: Seen and examined.  No acute overnight events.  No new complaints  Objective: Vitals:   09/29/22 1959 09/29/22 2300 09/30/22 0515 09/30/22 0854  BP: 139/65 133/64 136/62 (!) 148/66  Pulse: (!) 53 (!) 59 61 (!) 59  Resp: 17 16 15 19   Temp: 98.2 F (36.8 C) 98 F (36.7 C) 98 F (36.7 C) (!) 97.5 F (36.4 C)  TempSrc: Oral Oral Oral   SpO2: 98% 98% 98% 100%  Weight:        Intake/Output Summary (Last 24 hours) at 09/30/2022 1007 Last data filed at 09/29/2022 1700 Gross per 24 hour  Intake --  Output 450 ml  Net -450 ml   Filed Weights   09/26/22 2111  Weight: 71.4 kg    Examination:  General exam: No acute distress Respiratory system: Lungs clear.  Normal work of breathing.  Room  air Cardiovascular system: S1 S2, regular rate, regular rhythm, no murmurs Gastrointestinal system: Soft, NT/ND, normal bowel sounds Central nervous system: Alert and oriented. No focal neurological deficits. Extremities: Decreased power bilateral lower extremities.  Gait not assessed.  Right upper extremity decreased ROM.  Pain on palpation Skin: No rashes, lesions or ulcers Psychiatry: Judgement and insight appear normal. Mood & affect flattened.     Data Reviewed: I have personally reviewed following labs and imaging studies  CBC: Recent Labs  Lab 09/26/22 2140 09/27/22 0340 09/28/22 1023  WBC 9.1 7.6 6.3  NEUTROABS 7.6  --  4.5  HGB 14.6 12.7 12.2  HCT 44.4 37.2 36.2  MCV 91.0 91.0 91.4  PLT 173 131* 135*   Basic Metabolic Panel: Recent Labs  Lab 09/26/22 2140 09/27/22 0340 09/28/22 1023 09/29/22 0410  NA 145 146* 139 138  K 3.9 3.8 3.7 3.9  CL 111 116* 112* 114*  CO2 21* 22 20* 20*  GLUCOSE 114* 107* 154* 102*  BUN 67* 62* 49* 47*  CREATININE 1.71* 1.47* 1.34* 1.38*  CALCIUM 9.4 8.3* 7.7* 7.5*   GFR: Estimated Creatinine Clearance: 26.6 mL/min (A) (by C-G formula based on SCr of 1.38 mg/dL (H)). Liver Function Tests: Recent Labs  Lab 09/26/22 2140  AST 71*  ALT 32  ALKPHOS 59  BILITOT 1.5*  PROT 7.4  ALBUMIN 4.1   No results for input(s): "LIPASE", "AMYLASE" in the last 168 hours. No results for input(s): "AMMONIA" in the last 168 hours. Coagulation Profile: Recent Labs  Lab 09/26/22 2140  INR 1.3*   Cardiac Enzymes: Recent Labs  Lab 09/26/22 2140 09/27/22 0340 09/28/22 0423 09/29/22 0410  CKTOTAL 1,607* 1,003* 797* 371*   BNP (last 3 results) No results for input(s): "PROBNP" in the last 8760 hours. HbA1C: No results for input(s): "HGBA1C" in the last 72 hours. CBG: Recent Labs  Lab 09/26/22 2125  GLUCAP 119*   Lipid Profile: No results for input(s): "CHOL", "HDL", "LDLCALC", "TRIG", "CHOLHDL", "LDLDIRECT" in the last 72  hours.  Thyroid Function Tests: No results for input(s): "TSH", "T4TOTAL", "FREET4", "T3FREE", "THYROIDAB" in the last 72 hours. Anemia Panel: No results for input(s): "VITAMINB12", "FOLATE", "FERRITIN", "TIBC", "IRON", "RETICCTPCT" in the last 72 hours. Sepsis Labs: Recent Labs  Lab 09/26/22 2140 09/26/22 2343  LATICACIDVEN 2.4* 2.1*    Recent Results (from the past 240 hour(s))  Blood culture (routine single)     Status: None (Preliminary result)   Collection Time: 09/26/22  9:19 PM   Specimen: BLOOD  Result Value Ref Range Status   Specimen Description BLOOD BLOOD LEFT HAND  Final   Special Requests   Final    BOTTLES DRAWN AEROBIC AND ANAEROBIC Blood Culture results may not be optimal due to an inadequate volume of blood received in culture bottles   Culture   Final    NO GROWTH 4 DAYS Performed at [facility], [address] [place], [place] [address]    Report Status PENDING  Incomplete  Radiology Studies: DG Wrist 2 Views Right  Result Date: 09/28/2022 CLINICAL DATA:  History of prior wrist surgery with pain following fall, initial encounter EXAM: RIGHT WRIST - 2 VIEW COMPARISON:  None Available. FINDINGS: The proximal carpal row has been surgically removed with the exception of the pisiform. Distal radius and ulna appear within normal limits. Degenerative changes at the first [facility] joint are noted. No other focal abnormality is noted. IMPRESSION: Postsurgical changes with removal of the majority of the proximal carpal row. No acute abnormality noted. Electronically Signed   By: Alcide Clever M.D.   On: 09/28/2022 15:06   DG Forearm Right  Result Date: 09/28/2022 CLINICAL DATA:  Recent fall with right arm pain, initial encounter EXAM: RIGHT FOREARM - 2 VIEW COMPARISON:  None FINDINGS: No acute fracture or dislocation in the radius or ulna is seen. Postsurgical changes are noted in the wrist with removal of the first carpal row. This is better visualized  on recent wrist films. No soft tissue abnormality is noted. IMPRESSION: No acute abnormality noted. Electronically Signed   By: Alcide Clever M.D.   On: 09/28/2022 15:05   DG Humerus Right  Result Date: 09/28/2022 CLINICAL DATA:  Right upper arm pain, no known injury, initial encounter EXAM: RIGHT HUMERUS - 2+ VIEW COMPARISON:  None Available. FINDINGS: Degenerative changes are noted in the acromioclavicular and glenohumeral joints. No acute fracture or dislocation is seen. Humeral head is high-riding likely related to chronic rotator cuff injury. No soft tissue abnormality is noted. IMPRESSION: Chronic changes without acute abnormality. Electronically Signed   By: Alcide Clever M.D.   On: 09/28/2022 15:03        Scheduled Meds:  acidophilus  1 capsule Oral Daily   amLODipine  5 mg Oral Daily   aspirin EC  81 mg Oral Daily   busPIRone  10 mg Oral BID   cholecalciferol  1,000 Units Oral Daily   clopidogrel  75 mg Oral Daily   DULoxetine  30 mg Oral Daily   enoxaparin (LOVENOX) injection  30 mg Subcutaneous Q24H   hydrOXYzine  10 mg Oral BID   icosapent Ethyl  2 g Oral BID   metoprolol succinate  25 mg Oral Daily   multivitamin with minerals  1 tablet Oral Daily   pregabalin  75 mg Oral BID   Continuous Infusions:     LOS: 4 days    Tresa Moore, MD [facility]   If 7PM-7AM, please contact night-coverage  09/30/2022, 10:07 AM

## 2022-09-30 NOTE — Plan of Care (Signed)
  Problem: Clinical Measurements: Goal: Ability to maintain clinical measurements within normal limits will improve Outcome: Progressing Goal: Will remain free from infection Outcome: Progressing   

## 2022-09-30 NOTE — [place] FL2 (Signed)
[place] MEDICAID FL2 LEVEL OF CARE FORM     IDENTIFICATION  Patient Name: Gail Paul Birthdate: [birth year] Sex: female Admission Date (Current Location): 09/26/2022  [facility] and [place] Number:  Chiropodist and Address:  [facility], [address], [place], [place] [address]      Provider Number: [id number]  Attending Physician Name and Address:  Tresa Moore, MD  Relative Name and Phone Number:  Pechinski,Dennis Clearence Cheek)  [phone] (Mobile) (No family contacts)    Current Level of Care: Hospital Recommended Level of Care: Skilled Nursing Facility Prior Approval Number:    Date Approved/Denied:   PASRR Number: [id number]  Discharge Plan: SNF    Current Diagnoses: Patient Active Problem List   Diagnosis Date Noted   Acute kidney injury superimposed on chronic kidney disease (HCC) 09/27/2022   Altered mental status 09/27/2022   Rhabdomyolysis 09/26/2022   Elevated troponin 09/26/2022   Essential hypertension 09/26/2022   Anxiety and depression 09/26/2022   Dyslipidemia 09/26/2022   Type 2 diabetes mellitus with peripheral neuropathy (HCC) 09/26/2022   Nausea 07/02/2022   Acute cough 06/22/2022   Acute nasopharyngitis 06/22/2022   Spinal stenosis of lumbar region 06/16/2022   GAD (generalized anxiety disorder) 05/04/2022   Thoracic spondylosis with radiculopathy 05/04/2022   Mild intermittent asthma without complication 05/04/2022   Bilateral hand pain 08/22/2021   Status post proximal row carpectomy of wrist 08/22/2021   Arthritis of carpometacarpal [facility]) joint of left thumb 07/11/2021   Obesity (BMI 30-39.9) 11/01/2019   Adverse drug interaction with prescription medication 11/01/2019   Acute encephalopathy 10/31/2019   Diabetes (HCC) 12/16/2018   Full thickness rotator cuff tear 12/16/2018   Radial styloid tenosynovitis 12/16/2018   Shoulder pain 12/16/2018   Sprain of rotator cuff capsule 12/16/2018    Stiffness of shoulder joint 12/16/2018   PAD (peripheral artery disease) (HCC) 12/16/2018   Essential hypertension, benign 04/03/2016   Mitral valve disorder 03/16/2014   Congestive heart failure (HCC) 01/09/2013   CAD (coronary artery disease) 12/26/2012   Hyperlipidemia 12/26/2012   Old myocardial infarction 12/26/2012    Orientation RESPIRATION BLADDER Height & Weight     Self, Time, Situation, Place  Normal External catheter Weight: 71.4 kg Height:     BEHAVIORAL SYMPTOMS/MOOD NEUROLOGICAL BOWEL NUTRITION STATUS      Continent Diet  AMBULATORY STATUS COMMUNICATION OF NEEDS Skin   Extensive Assist Verbally Skin abrasions (on admission)                       Personal Care Assistance Level of Assistance  Bathing, Feeding, Dressing Bathing Assistance: Maximum assistance Feeding assistance: Limited assistance Dressing Assistance: Maximum assistance     Functional Limitations Info  Sight Sight Info: Impaired        SPECIAL CARE FACTORS FREQUENCY  PT (By licensed PT), OT (By licensed OT)     PT Frequency: 5x/week OT Frequency: 5x/week            Contractures Contractures Info: Not present    Additional Factors Info  Code Status, Allergies Code Status Info: DNR Allergies Info: Shellfish Allergy, Fenofibrate, Meloxicam, Tramadol           Current Medications (09/30/2022):  This is the current hospital active medication list Current Facility-Administered Medications  Medication Dose Route Frequency Provider Last Rate Last Admin   acetaminophen (TYLENOL) tablet 650 mg  650 mg Oral Q6H PRN Mansy, Vernetta Honey, MD   650 mg at 09/29/22 [phone]  Or   acetaminophen (TYLENOL) suppository 650 mg  650 mg Rectal Q6H PRN Mansy, Jan A, MD       acidophilus (RISAQUAD) capsule 1 capsule  1 capsule Oral Daily Mansy, Jan A, MD   1 capsule at 09/30/22 1006   albuterol (PROVENTIL) (2.5 MG/3ML) 0.083% nebulizer solution 2.5 mg  2.5 mg Nebulization Q6H PRN Otelia Sergeant, RPH        amLODipine (NORVASC) tablet 5 mg  5 mg Oral Daily Mansy, Jan A, MD   5 mg at 09/30/22 1005   aspirin EC tablet 81 mg  81 mg Oral Daily Mansy, Jan A, MD   81 mg at 09/30/22 1005   busPIRone (BUSPAR) tablet 10 mg  10 mg Oral BID Mansy, Jan A, MD   10 mg at 09/30/22 1006   cholecalciferol (VITAMIN D3) 25 MCG (1000 UNIT) tablet 1,000 Units  1,000 Units Oral Daily Mansy, Jan A, MD   1,000 Units at 09/30/22 1006   clopidogrel (PLAVIX) tablet 75 mg  75 mg Oral Daily Mansy, Jan A, MD   75 mg at 09/30/22 1006   DULoxetine (CYMBALTA) DR capsule 30 mg  30 mg Oral Daily Mansy, Jan A, MD   30 mg at 09/30/22 1006   enoxaparin (LOVENOX) injection 30 mg  30 mg Subcutaneous Q24H Mansy, Jan A, MD   30 mg at 09/30/22 1006   hydrALAZINE (APRESOLINE) injection 10 mg  10 mg Intravenous Q4H PRN Lolita Patella B, MD       hydrOXYzine (ATARAX) tablet 10 mg  10 mg Oral BID Mansy, Jan A, MD   10 mg at 09/30/22 1006   icosapent Ethyl (VASCEPA) 1 g capsule 2 g  2 g Oral BID Mansy, Jan A, MD   2 g at 09/30/22 1005   magnesium hydroxide (MILK OF MAGNESIA) suspension 30 mL  30 mL Oral Daily PRN Mansy, Jan A, MD       metoprolol succinate (TOPROL-XL) 24 hr tablet 25 mg  25 mg Oral Daily Mansy, Jan A, MD   25 mg at 09/30/22 1005   morphine (PF) 2 MG/ML injection 2 mg  2 mg Intravenous Q2H PRN Mansy, Jan A, MD       multivitamin with minerals tablet 1 tablet  1 tablet Oral Daily Mansy, Jan A, MD   1 tablet at 09/30/22 1006   nitroGLYCERIN (NITROSTAT) SL tablet 0.4 mg  0.4 mg Sublingual Q5 min PRN Mansy, Jan A, MD       ondansetron [facility]) tablet 4 mg  4 mg Oral Q6H PRN Mansy, Jan A, MD       Or   ondansetron [facility]) injection 4 mg  4 mg Intravenous Q6H PRN Mansy, Jan A, MD       pregabalin (LYRICA) capsule 75 mg  75 mg Oral BID Mansy, Jan A, MD   75 mg at 09/30/22 1006   traZODone (DESYREL) tablet 25 mg  25 mg Oral QHS PRN Mansy, Jan A, MD   25 mg at 09/28/22 2011   triamcinolone (KENALOG) 0.025 % cream 1 Application  1  Application Topical BID PRN Mansy, Vernetta Honey, MD         Discharge Medications: Please see discharge summary for a list of discharge medications.  Relevant Imaging Results:  Relevant Lab Results:   Additional Information SS# [id number]  Bing Quarry, RN

## 2022-09-30 NOTE — TOC Progression Note (Signed)
Transition of Care [facility]) - Progression Note    Patient Details  Name: JERALD KIMLER MRN: [id number] Date of Birth: February 20, 1934  Transition of Care [facility]) CM/SW Contact  Bing Quarry, RN Phone Number: 09/30/2022, 2:10 PM  Clinical Narrative:  7/28: Sherron Monday again with patient regarding STR/SNF choices after she received and review Medicare.gov list of [place] area choices. At this time her first choice was [facility] but may not accept her insurance, so [facility] was second choice. She wants to choose only facilities with 4 or 5 star ratings though I explained those can vary quite a bit. Reviewed some other options but wishes to try for those preferences first. Will submit bed search. Will need therapy notes as was seen last 09/28/22 for evaluation.   Gabriel Cirri MSN RN SCRN Transitions of Care Department RN [facility] [phone] Weekends Only           Expected Discharge Plan and Services                                               Social Determinants of Health (SDOH) Interventions SDOH Screenings   Tobacco Use: Low Risk  (09/22/2022)    Readmission Risk Interventions     No data to display

## 2022-09-30 NOTE — Plan of Care (Signed)
  Problem: Clinical Measurements: Goal: Respiratory complications will improve Outcome: Progressing   Problem: Clinical Measurements: Goal: Cardiovascular complication will be avoided Outcome: Progressing   Problem: Clinical Measurements: Goal: Ability to maintain clinical measurements within normal limits will improve Outcome: Progressing   Problem: Activity: Goal: Risk for activity intolerance will decrease Outcome: Progressing   Problem: Skin Integrity: Goal: Risk for impaired skin integrity will decrease Outcome: Progressing   Problem: Safety: Goal: Ability to remain free from injury will improve Outcome: Progressing   Problem: Pain Managment: Goal: General experience of comfort will improve Outcome: Progressing

## 2022-10-01 ENCOUNTER — Other Ambulatory Visit (HOSPITAL_COMMUNITY): Payer: Self-pay

## 2022-10-01 DIAGNOSIS — T796XXD Traumatic ischemia of muscle, subsequent encounter: Secondary | ICD-10-CM | POA: Diagnosis not present

## 2022-10-01 NOTE — TOC Progression Note (Signed)
Transition of Care [facility]) - Progression Note    Patient Details  Name: Gail Paul MRN: [id number] Date of Birth: 20-Jan-1934  Transition of Care [facility]) CM/SW Contact  Truddie Hidden, RN Phone Number: 10/01/2022, 4:07 PM  Clinical Narrative:    Spoke with patient at bedside to advised [facility] has declined and no bed offer had been given by [facility]. She is agreeable to widen search to [facility]. Patient has complaints of blisters to R breast, and rash to back and buttocks. Nurse notified. Bed search extended.         Expected Discharge Plan and Services                                               Social Determinants of Health (SDOH) Interventions SDOH Screenings   Tobacco Use: Low Risk  (09/22/2022)    Readmission Risk Interventions     No data to display

## 2022-10-01 NOTE — Care Management Important Message (Signed)
Important Message  Patient Details  Name: Gail Paul MRN: [id number] Date of Birth: [birth year]   Medicare Important Message Given:  Yes  Pt not in room   Bernadette Hoit 10/01/2022, 3:33 PM

## 2022-10-01 NOTE — Progress Notes (Signed)
PROGRESS NOTE    Gail Paul  QIO:[id number] DOB: 15-Sep-1933 DOA: 09/26/2022 PCP: Margaretann Loveless, MD    Brief Narrative:  89 y.o. Caucasian female with medical history significant for CHF, coronary artery disease, hypertension and osteoarthritis, who presented to the emergency room with acute onset of fall.  The patient was last heard from 3 days ago.  She lives alone.  Her friend was checking on her today and found her facedown on the ground.  She was complaining of pain in the upper back and the right side of her face.  She does not recall a fall or why she ended up on the ground.  She was not oriented to the day but was oriented to person, place and year.  No reported chest pain or palpitations.  No report cough or wheezing or hemoptysis.  No reported abdominal pain or nausea or vomiting or diarrhea.  No dysuria, oliguria, urinary frequency or urgency or flank pain.  The patient was fairly asleep during my interview no further history could be obtained.   7/25: Troponins trending down.  CK trending down. 7/27: Rhabdomyolysis resolved.  RUE xray survey negative 7/28: Medical ready for discharge   Assessment & Plan:   Principal Problem:   Rhabdomyolysis Active Problems:   Elevated troponin   Type 2 diabetes mellitus with peripheral neuropathy (HCC)   Acute kidney injury superimposed on chronic kidney disease (HCC)   Essential hypertension   Altered mental status   Congestive heart failure (HCC)   Anxiety and depression   Dyslipidemia  Traumatic rhabdomyolysis Likely secondary to extended time down.  Could be secondary to a fall associated with syncope.  Unclear etiology.  CK trending down. Plan: Continue PT and OT as tolerated Medically ready for or skilled nursing facility No further IV fluids  Elevated troponin Suspect this is related to supply/demand ischemia in the setting of acute rhabdomyolysis from extended time down.  Patient not having chest pain.  No EKG changes  just ACS.  Troponins now downtrending. Plan: Continue beta-blocker, aspirin, Plavix Outpatient cardiology follow-up   Acute kidney injury Unable to completely rule in chronic kidney disease.  Baseline creatinine from 2017 appears to be normal.  Suspect this is acute kidney injury in the setting of hypovolemia/dehydration from extended time down.  Creatinine downtrending Plan: Creatinine has plateaued.   No further IVF  Type 2 diabetes mellitus with peripheral neuropathy (HCC) PTA Farxiga and Lyrica continued.   Supplemental coverage with NovoLog.   Carb modified diet  Altered mental status Appears improved.   Patient alert and oriented x 3   Essential hypertension PTA blood pressure regimen   Dyslipidemia Statin on hold   Anxiety and depression PTA BuSpar and Cymbalta   Congestive heart failure (HCC) PTA Toprol and Farxiga  DVT prophylaxis: SQ Lovenox Code Status: DNR Family Communication: None today Disposition Plan: Status is: Inpatient Remains inpatient appropriate because: Unsafe discharge plan.  Medically ready for discharge to skilled nursing facility.   Level of care: Telemetry Cardiac  Consultants:  None  Procedures:  None  Antimicrobials: None    Subjective: Seen and examined.  Sitting up in chair.  Working with therapy.  No new complaints.  No acute overnight events.  Objective: Vitals:   09/30/22 1941 09/30/22 2333 10/01/22 0330 10/01/22 0824  BP: (!) 146/58 117/63 133/63 (!) 146/68  Pulse: 63 (!) 55 (!) 51 (!) 51  Resp: 18 18 18  (!) 22  Temp:  98.5 F (36.9 C) 98.1 F (36.7  C) 97.6 F (36.4 C)  TempSrc:  Oral    SpO2: 99% 97% 96% 99%  Weight:        Intake/Output Summary (Last 24 hours) at 10/01/2022 1104 Last data filed at 10/01/2022 1019 Gross per 24 hour  Intake 300 ml  Output --  Net 300 ml   Filed Weights   09/26/22 2111  Weight: 71.4 kg    Examination:  General exam: NAD Respiratory system: Lungs clear.  Normal work  of breathing.  Room air Cardiovascular system: S1 S2, regular rate, regular rhythm, no murmurs Gastrointestinal system: Soft, NT/ND, normal bowel sounds Central nervous system: Alert and oriented. No focal neurological deficits. Extremities: Decreased power bilateral lower extremities.  Gait not assessed.  Right upper extremity decreased ROM.  Pain on palpation Skin: No rashes, lesions or ulcers Psychiatry: Judgement and insight appear normal. Mood & affect flattened.     Data Reviewed: I have personally reviewed following labs and imaging studies  CBC: Recent Labs  Lab 09/26/22 2140 09/27/22 0340 09/28/22 1023  WBC 9.1 7.6 6.3  NEUTROABS 7.6  --  4.5  HGB 14.6 12.7 12.2  HCT 44.4 37.2 36.2  MCV 91.0 91.0 91.4  PLT 173 131* 135*   Basic Metabolic Panel: Recent Labs  Lab 09/26/22 2140 09/27/22 0340 09/28/22 1023 09/29/22 0410  NA 145 146* 139 138  K 3.9 3.8 3.7 3.9  CL 111 116* 112* 114*  CO2 21* 22 20* 20*  GLUCOSE 114* 107* 154* 102*  BUN 67* 62* 49* 47*  CREATININE 1.71* 1.47* 1.34* 1.38*  CALCIUM 9.4 8.3* 7.7* 7.5*   GFR: Estimated Creatinine Clearance: 26.6 mL/min (A) (by C-G formula based on SCr of 1.38 mg/dL (H)). Liver Function Tests: Recent Labs  Lab 09/26/22 2140  AST 71*  ALT 32  ALKPHOS 59  BILITOT 1.5*  PROT 7.4  ALBUMIN 4.1   No results for input(s): "LIPASE", "AMYLASE" in the last 168 hours. No results for input(s): "AMMONIA" in the last 168 hours. Coagulation Profile: Recent Labs  Lab 09/26/22 2140  INR 1.3*   Cardiac Enzymes: Recent Labs  Lab 09/26/22 2140 09/27/22 0340 09/28/22 0423 09/29/22 0410  CKTOTAL 1,607* 1,003* 797* 371*   BNP (last 3 results) No results for input(s): "PROBNP" in the last 8760 hours. HbA1C: No results for input(s): "HGBA1C" in the last 72 hours. CBG: Recent Labs  Lab 09/26/22 2125  GLUCAP 119*   Lipid Profile: No results for input(s): "CHOL", "HDL", "LDLCALC", "TRIG", "CHOLHDL", "LDLDIRECT" in  the last 72 hours.  Thyroid Function Tests: No results for input(s): "TSH", "T4TOTAL", "FREET4", "T3FREE", "THYROIDAB" in the last 72 hours. Anemia Panel: No results for input(s): "VITAMINB12", "FOLATE", "FERRITIN", "TIBC", "IRON", "RETICCTPCT" in the last 72 hours. Sepsis Labs: Recent Labs  Lab 09/26/22 2140 09/26/22 2343  LATICACIDVEN 2.4* 2.1*    Recent Results (from the past 240 hour(s))  Blood culture (routine single)     Status: None   Collection Time: 09/26/22  9:19 PM   Specimen: BLOOD  Result Value Ref Range Status   Specimen Description BLOOD BLOOD LEFT HAND  Final   Special Requests   Final    BOTTLES DRAWN AEROBIC AND ANAEROBIC Blood Culture results may not be optimal due to an inadequate volume of blood received in culture bottles   Culture   Final    NO GROWTH 5 DAYS Performed at [facility], [address] [place], [place] [address]    Report Status 10/01/2022 FINAL  Final  Radiology Studies: No results found.      Scheduled Meds:  acidophilus  1 capsule Oral Daily   amLODipine  5 mg Oral Daily   aspirin EC  81 mg Oral Daily   busPIRone  10 mg Oral BID   cholecalciferol  1,000 Units Oral Daily   clopidogrel  75 mg Oral Daily   DULoxetine  30 mg Oral Daily   enoxaparin (LOVENOX) injection  30 mg Subcutaneous Q24H   hydrOXYzine  10 mg Oral BID   icosapent Ethyl  2 g Oral BID   metoprolol succinate  25 mg Oral Daily   multivitamin with minerals  1 tablet Oral Daily   pregabalin  75 mg Oral BID   Continuous Infusions:     LOS: 5 days    Tresa Moore, MD [facility]   If 7PM-7AM, please contact night-coverage  10/01/2022, 11:04 AM

## 2022-10-01 NOTE — Progress Notes (Signed)
Occupational Therapy Treatment Patient Details Name: Gail Paul MRN: [id number] DOB: 03-28-1933 Today's Date: 10/01/2022   History of present illness Pt is an 89y/o female presenting with rhabdomyolysis and following a fall at home. PMH includes CHF, CAD, HTN, OA, T2D, Anxiety, and Depression   OT comments  Pt seen for OT tx. Pt completed sup>sit with MIN A and noted mild dizziness initially that improved with time seated EOB. Once improved, pt required MIN A To stand and CGA for short steps to recliner with RW and VC for RW mgt as she tends to leave it behind when turning. Pt instructed in RUE positioning for elevation to improve R hand edema. Pillow positioned. Pt verbalized understanding. Set up for lunch, all needs in reach. Pt demonstrating improvement particularly in RUE pain and functional use, able to use RUE for bed mobility and standing this date. Continues to benefit from skilled OT services.    Recommendations for follow up therapy are one component of a multi-disciplinary discharge planning process, led by the attending physician.  Recommendations may be updated based on patient status, additional functional criteria and insurance authorization.    Assistance Recommended at Discharge Frequent or constant Supervision/Assistance  Patient can return home with the following  A lot of help with bathing/dressing/bathroom;Assistance with cooking/housework;Help with stairs or ramp for entrance;Assist for transportation;Direct supervision/assist for medications management;Direct supervision/assist for financial management;A little help with walking and/or transfers   Equipment Recommendations  Other (comment) (defer to next venue)    Recommendations for Other Services      Precautions / Restrictions Precautions Precautions: Fall Restrictions Weight Bearing Restrictions: No       Mobility Bed Mobility Overal bed mobility: Needs Assistance Bed Mobility: Supine to Sit      Supine to sit: Min assist, HOB elevated          Transfers Overall transfer level: Needs assistance Equipment used: Rolling walker (2 wheels) Transfers: Sit to/from Stand, Bed to chair/wheelchair/BSC Sit to Stand: From elevated surface, Min assist     Step pivot transfers: Min guard     General transfer comment: VC for RW mgt as she tends to keep it too far away as she turns to the recliner     Balance Overall balance assessment: Needs assistance Sitting-balance support: Bilateral upper extremity supported, Feet supported, Single extremity supported Sitting balance-Leahy Scale: Fair     Standing balance support: Reliant on assistive device for balance, During functional activity Standing balance-Leahy Scale: Fair                             ADL either performed or assessed with clinical judgement   ADL Overall ADL's : Needs assistance/impaired Eating/Feeding: Sitting;Set up Eating/Feeding Details (indicate cue type and reason): setup for improved access using non-dom hand                                        Extremity/Trunk Assessment              Vision       Perception     Praxis      Cognition Arousal/Alertness: Awake/alert Behavior During Therapy: WFL for tasks assessed/performed Overall Cognitive Status: Within Functional Limits for tasks assessed  Exercises Other Exercises Other Exercises: Pt instructed in RUE positioning for elevation to improve R hand edema. Pillow positioned.    Shoulder Instructions       General Comments BP rest 147/67, sitting 166/77    Pertinent Vitals/ Pain       Pain Assessment Pain Assessment: Faces Faces Pain Scale: Hurts a little bit Pain Location: R shoulder Pain Descriptors / Indicators: Discomfort, Constant, Grimacing Pain Intervention(s): Limited activity within patient's tolerance, Monitored during session,  Repositioned  Home Living                                          Prior Functioning/Environment              Frequency  Min 1X/week        Progress Toward Goals  OT Goals(current goals can now be found in the care plan section)  Progress towards OT goals: Progressing toward goals  Acute Rehab OT Goals Patient Stated Goal: get better OT Goal Formulation: With patient Time For Goal Achievement: 10/12/22 Potential to Achieve Goals: Good  Plan Discharge plan remains appropriate;Frequency remains appropriate    Co-evaluation                 AM-PAC OT "6 Clicks" Daily Activity     Outcome Measure   Help from another person eating meals?: None Help from another person taking care of personal grooming?: A Little Help from another person toileting, which includes using toliet, bedpan, or urinal?: A Lot Help from another person bathing (including washing, rinsing, drying)?: A Lot Help from another person to put on and taking off regular upper body clothing?: A Lot Help from another person to put on and taking off regular lower body clothing?: A Lot 6 Click Score: 15    End of Session    OT Visit Diagnosis: Other abnormalities of gait and mobility (R26.89);Pain;History of falling (Z91.81) Pain - Right/Left: Right Pain - part of body: Shoulder;Arm;Hand   Activity Tolerance Patient tolerated treatment well   Patient Left in chair;with call bell/phone within reach;with chair alarm set   Nurse Communication          Time: 6578-4696 OT Time Calculation (min): 12 min  Charges: OT General Charges $OT Visit: 1 Visit OT Treatments $Self Care/Home Management : 8-22 mins  Arman Filter., MPH, MS, OTR/L ascom [phone] 10/01/22, 3:13 PM

## 2022-10-01 NOTE — Progress Notes (Addendum)
Physical Therapy Treatment Patient Details Name: Gail Paul MRN: [id number] DOB: 1934-03-04 Today's Date: 10/01/2022   History of Present Illness Pt is an 89y/o female presenting with rhabdomyolysis and following a fall at home. PMH includes CHF, CAD, HTN, OA, T2D, Anxiety, and Depression    PT Comments  Pt presents laying in bed, A&Ox4. She continues to have pain in RUE but has improved since previous PT visit. Pt able to perform supine>sit and sit<>stand with minA and RW but required multiple attempts/ rocking and elevated surface. Pt also transferred from bed> recliner with lateral stepping and required assistance for self cleaning from soiled sheets. Ambulation deferred due to breakfast arrival. She tolerated treatment well, but verbalizes she is not as mobile as she was PTA. Pt would benefit from continued skilled therapy to maximize functional assistance.    If plan is discharge home, recommend the following: Direct supervision/assist for financial management;Assistance with cooking/housework;Assist for transportation;Assistance with feeding;Help with stairs or ramp for entrance;Direct supervision/assist for medications management;A little help with walking and/or transfers;A lot of help with bathing/dressing/bathroom   Can travel by private vehicle     No  Equipment Recommendations  Other (comment)    Recommendations for Other Services       Precautions / Restrictions Precautions Precautions: Fall Restrictions Weight Bearing Restrictions: No     Mobility  Bed Mobility Overal bed mobility: Needs Assistance Bed Mobility: Supine to Sit     Supine to sit: Min assist     General bed mobility comments: Improved RUE use since previous visit. Required ModA for completing task    Transfers Overall transfer level: Needs assistance Equipment used: Rolling walker (2 wheels) Transfers: Sit to/from Stand, Bed to chair/wheelchair/BSC Sit to Stand: From elevated surface, Min  assist           General transfer comment: sit>stand required multiple attempts/ rocking    Ambulation/Gait                   Stairs             Wheelchair Mobility     Tilt Bed    Modified Rankin (Stroke Patients Only)       Balance Overall balance assessment: Needs assistance Sitting-balance support: Bilateral upper extremity supported, Feet supported Sitting balance-Leahy Scale: Fair Sitting balance - Comments: Able to maintain sitting with out therapist assistance     Standing balance-Leahy Scale: Poor                              Cognition Arousal/Alertness: Awake/alert Behavior During Therapy: WFL for tasks assessed/performed Overall Cognitive Status: Within Functional Limits for tasks assessed                                 General Comments: A&Ox4        Exercises      General Comments General comments (skin integrity, edema, etc.): BP rest 147/67, sitting 166/77      Pertinent Vitals/Pain Pain Assessment Pain Assessment: Faces Faces Pain Scale: Hurts a little bit Pain Location: R shoulder Pain Descriptors / Indicators: Discomfort, Constant, Grimacing Pain Intervention(s): Monitored during session    Home Living                          Prior Function  PT Goals (current goals can now be found in the care plan section)      Frequency    Min 1X/week      PT Plan Current plan remains appropriate    Co-evaluation              AM-PAC PT "6 Clicks" Mobility   Outcome Measure  Help needed turning from your back to your side while in a flat bed without using bedrails?: A Little Help needed moving from lying on your back to sitting on the side of a flat bed without using bedrails?: A Little Help needed moving to and from a bed to a chair (including a wheelchair)?: A Little Help needed standing up from a chair using your arms (e.g., wheelchair or bedside chair)?: A  Lot Help needed to walk in hospital room?: A Lot Help needed climbing 3-5 steps with a railing? : Total 6 Click Score: 14    End of Session Equipment Utilized During Treatment: Gait belt Activity Tolerance: Patient tolerated treatment well Patient left: in chair;with call bell/phone within reach;with chair alarm set Nurse Communication: Mobility status PT Visit Diagnosis: Other abnormalities of gait and mobility (R26.89);Repeated falls (R29.6);Difficulty in walking, not elsewhere classified (R26.2);Muscle weakness (generalized) (M62.81)     Time: 6237-6283 PT Time Calculation (min) (ACUTE ONLY): 28 min  Charges:    $Therapeutic Activity: 23-37 mins PT General Charges $$ ACUTE PT VISIT: 1 Visit                    Arman Loy, PT, SPT 12:05 PM,10/01/22

## 2022-10-01 NOTE — Plan of Care (Signed)

## 2022-10-01 NOTE — Consult Note (Signed)
[facility] Customer service manager [facility]) Accountable Care Organization (ACO) [facility] Liaison Note  10/01/2022  ANANA DEYTON Mar 08, 1933 [id number]  Location: [facility] Liaison screened the patient remotely at [facility].  Insurance: [facility] HMO   Hally I Schuerger is a 89 y.o. female who is a Primary Care Patient of Margaretann Loveless, MD. The patient was screened for readmission hospitalization with noted low risk score for unplanned readmission risk with 1 IP in 6 months.  The patient was assessed for potential [facility]) Care Management service needs for post hospital transition for care coordination. Review of patient's electronic medical record reveals patient was admitted for Rhabdomyolysis. Pt's will be discharged to a STR for SNF level of care. Facility to address pt's ongoing care management needs post hospital discharge.   [facility] Care Management/Population Health does not replace or interfere with any arrangements made by the Inpatient Transition of Care team.   For questions contact:   Elliot Cousin, RN, [facility] Liaison [place]   Population Health Office Hours MTWF  8:00 am-6:00 pm Off on Thursday [phone] mobile [phone] [Office toll free line] Office Hours are M-F 8:30 - 5 pm 24 hour nurse advise line [phone] Concierge  [email or web address]

## 2022-10-01 NOTE — TOC Benefit Eligibility Note (Signed)
Pharmacy Patient Advocate Encounter  Insurance verification completed.    The patient is insured through [employer] test claim for Jardiance and the current 30 day co-pay is $45.00.  Ran test claim for Marcelline Deist and the current 30 day co-pay is $95.00.   This test claim was processed through [facility]- copay amounts may vary at other pharmacies due to pharmacy/plan contracts, or as the patient moves through the different stages of their insurance plan.

## 2022-10-02 DIAGNOSIS — T796XXD Traumatic ischemia of muscle, subsequent encounter: Secondary | ICD-10-CM | POA: Diagnosis not present

## 2022-10-02 NOTE — Progress Notes (Signed)
PROGRESS NOTE    Gail Paul  OZD:[id number] DOB: December 03, 1933 DOA: 09/26/2022 PCP: Margaretann Loveless, MD    Brief Narrative:  89 y.o. Caucasian female with medical history significant for CHF, coronary artery disease, hypertension and osteoarthritis, who presented to the emergency room with acute onset of fall.  The patient was last heard from 3 days ago.  She lives alone.  Her friend was checking on her today and found her facedown on the ground.  She was complaining of pain in the upper back and the right side of her face.  She does not recall a fall or why she ended up on the ground.  She was not oriented to the day but was oriented to person, place and year.  No reported chest pain or palpitations.  No report cough or wheezing or hemoptysis.  No reported abdominal pain or nausea or vomiting or diarrhea.  No dysuria, oliguria, urinary frequency or urgency or flank pain.  The patient was fairly asleep during my interview no further history could be obtained.   7/25: Troponins trending down.  CK trending down. 7/27: Rhabdomyolysis resolved.  RUE xray survey negative 7/28: Medical ready for discharge   Assessment & Plan:   Principal Problem:   Rhabdomyolysis Active Problems:   Elevated troponin   Type 2 diabetes mellitus with peripheral neuropathy (HCC)   Acute kidney injury superimposed on chronic kidney disease (HCC)   Essential hypertension   Altered mental status   Congestive heart failure (HCC)   Anxiety and depression   Dyslipidemia  Traumatic rhabdomyolysis Likely secondary to extended time down.  Could be secondary to a fall associated with syncope.  Unclear etiology.  CK trending down. Plan: Continue PT and OT as tolerated Medically ready for or skilled nursing facility No need for IV fluids at this time  Elevated troponin Suspect this is related to supply/demand ischemia in the setting of acute rhabdomyolysis from extended time down.  Patient not having chest pain.  No  EKG changes just ACS.  Troponins now downtrending. Plan: Continue beta-blocker, aspirin, Plavix Outpatient cardiology follow-up   Acute kidney injury Unable to completely rule in chronic kidney disease.  Baseline creatinine from 2017 appears to be normal.  Suspect this is acute kidney injury in the setting of hypovolemia/dehydration from extended time down.  Creatinine downtrending Plan: Creatinine has plateaued.   No further IVF  Type 2 diabetes mellitus with peripheral neuropathy (HCC) PTA Farxiga and Lyrica continued.   Supplemental coverage with NovoLog.   Carb modified diet  Altered mental status Appears improved.   Patient alert and oriented x 3   Essential hypertension PTA blood pressure regimen   Dyslipidemia Statin on hold   Anxiety and depression PTA BuSpar and Cymbalta   Congestive heart failure (HCC) PTA Toprol and Farxiga  DVT prophylaxis: SQ Lovenox Code Status: DNR Family Communication: None today Disposition Plan: Status is: Inpatient Remains inpatient appropriate because: Unsafe discharge plan.  Medically ready for discharge to skilled nursing facility.   Level of care: Telemetry Cardiac  Consultants:  None  Procedures:  None  Antimicrobials: None    Subjective: Patient seen and examined.  Lying in bed.  No visible distress.  Does complain of pain in right upper extremity.  X-ray survey negative for fracture or dislocation.  Objective: Vitals:   10/02/22 0300 10/02/22 0313 10/02/22 0755 10/02/22 1220  BP:  (!) 118/56 (!) 137/57 128/70  Pulse:  66 (!) 56 64  Resp:  18 16 16  Temp:  98.4 F (36.9 C) 97.8 F (36.6 C) 97.8 F (36.6 C)  TempSrc:      SpO2:  99% 100% 98%  Weight: 78.2 kg       Intake/Output Summary (Last 24 hours) at 10/02/2022 1352 Last data filed at 10/02/2022 0931 Gross per 24 hour  Intake 1220 ml  Output 500 ml  Net 720 ml   Filed Weights   09/26/22 2111 10/02/22 0300  Weight: 71.4 kg 78.2 kg     Examination:  General exam: No acute distress Respiratory system: Lungs clear.  Normal work of breathing.  Room air Cardiovascular system: S1-S2, RRR, no murmurs, no pedal edema Gastrointestinal system: Soft, NT/ND, normal bowel sounds Central nervous system: Alert and oriented. No focal neurological deficits. Extremities: Decreased power bilateral lower extremities.  Gait not assessed.  Right upper extremity decreased ROM.  Pain on palpation Skin: No rashes, lesions or ulcers Psychiatry: Judgement and insight appear normal. Mood & affect flattened.     Data Reviewed: I have personally reviewed following labs and imaging studies  CBC: Recent Labs  Lab 09/26/22 2140 09/27/22 0340 09/28/22 1023  WBC 9.1 7.6 6.3  NEUTROABS 7.6  --  4.5  HGB 14.6 12.7 12.2  HCT 44.4 37.2 36.2  MCV 91.0 91.0 91.4  PLT 173 131* 135*   Basic Metabolic Panel: Recent Labs  Lab 09/26/22 2140 09/27/22 0340 09/28/22 1023 09/29/22 0410  NA 145 146* 139 138  K 3.9 3.8 3.7 3.9  CL 111 116* 112* 114*  CO2 21* 22 20* 20*  GLUCOSE 114* 107* 154* 102*  BUN 67* 62* 49* 47*  CREATININE 1.71* 1.47* 1.34* 1.38*  CALCIUM 9.4 8.3* 7.7* 7.5*   GFR: Estimated Creatinine Clearance: 27.8 mL/min (A) (by C-G formula based on SCr of 1.38 mg/dL (H)). Liver Function Tests: Recent Labs  Lab 09/26/22 2140  AST 71*  ALT 32  ALKPHOS 59  BILITOT 1.5*  PROT 7.4  ALBUMIN 4.1   No results for input(s): "LIPASE", "AMYLASE" in the last 168 hours. No results for input(s): "AMMONIA" in the last 168 hours. Coagulation Profile: Recent Labs  Lab 09/26/22 2140  INR 1.3*   Cardiac Enzymes: Recent Labs  Lab 09/26/22 2140 09/27/22 0340 09/28/22 0423 09/29/22 0410  CKTOTAL 1,607* 1,003* 797* 371*   BNP (last 3 results) No results for input(s): "PROBNP" in the last 8760 hours. HbA1C: No results for input(s): "HGBA1C" in the last 72 hours. CBG: Recent Labs  Lab 09/26/22 2125  GLUCAP 119*   Lipid  Profile: No results for input(s): "CHOL", "HDL", "LDLCALC", "TRIG", "CHOLHDL", "LDLDIRECT" in the last 72 hours.  Thyroid Function Tests: No results for input(s): "TSH", "T4TOTAL", "FREET4", "T3FREE", "THYROIDAB" in the last 72 hours. Anemia Panel: No results for input(s): "VITAMINB12", "FOLATE", "FERRITIN", "TIBC", "IRON", "RETICCTPCT" in the last 72 hours. Sepsis Labs: Recent Labs  Lab 09/26/22 2140 09/26/22 2343  LATICACIDVEN 2.4* 2.1*    Recent Results (from the past 240 hour(s))  Blood culture (routine single)     Status: None   Collection Time: 09/26/22  9:19 PM   Specimen: BLOOD  Result Value Ref Range Status   Specimen Description BLOOD BLOOD LEFT HAND  Final   Special Requests   Final    BOTTLES DRAWN AEROBIC AND ANAEROBIC Blood Culture results may not be optimal due to an inadequate volume of blood received in culture bottles   Culture   Final    NO GROWTH 5 DAYS Performed at [facility], [address], [place], [place] [address]    Report Status 10/01/2022 FINAL  Final         Radiology Studies: No results found.      Scheduled Meds:  acidophilus  1 capsule Oral Daily   amLODipine  5 mg Oral Daily   aspirin EC  81 mg Oral Daily   busPIRone  10 mg Oral BID   cholecalciferol  1,000 Units Oral Daily   clopidogrel  75 mg Oral Daily   DULoxetine  30 mg Oral Daily   enoxaparin (LOVENOX) injection  30 mg Subcutaneous Q24H   hydrOXYzine  10 mg Oral BID   icosapent Ethyl  2 g Oral BID   metoprolol succinate  25 mg Oral Daily   multivitamin with minerals  1 tablet Oral Daily   pregabalin  75 mg Oral BID   Continuous Infusions:     LOS: 6 days    Tresa Moore, MD [facility]   If 7PM-7AM, please contact night-coverage  10/02/2022, 1:52 PM

## 2022-10-02 NOTE — Plan of Care (Signed)

## 2022-10-02 NOTE — Progress Notes (Signed)
Occupational Therapy Treatment Patient Details Name: Gail Paul MRN: [id number] DOB: 09-19-1933 Today's Date: 10/02/2022   History of present illness Pt is an 89y/o female presenting with rhabdomyolysis and following a fall at home. PMH includes CHF, CAD, HTN, OA, T2D, Anxiety, and Depression   OT comments  Pt seen for OT tx. Pt received sleeping, wakes easily to OT's voice. Pt wakes and reports to author that she was cold. Found to be laying in bed saturated with urine. Supv for bed mobility. Once EOB, pt required MIN-MOD A for UB bathing, MOD A for LB bathing, and MOD A for UB dressing. CGA for transfers with RW and for mobility in room. Pt educated in risks of skin breakdown and pt notes she attempted to notify nursing overnight. Active listening utilized. Pt's day shift RN notified and verbalized that the unit director would be notified. Pt set up with breakfast in recliner. RUE noted with mild improvement in swelling today. Pt continues to benefit from skilled OT services.    Recommendations for follow up therapy are one component of a multi-disciplinary discharge planning process, led by the attending physician.  Recommendations may be updated based on patient status, additional functional criteria and insurance authorization.    Assistance Recommended at Discharge Frequent or constant Supervision/Assistance  Patient can return home with the following  A lot of help with bathing/dressing/bathroom;Assistance with cooking/housework;Help with stairs or ramp for entrance;Assist for transportation;Direct supervision/assist for medications management;Direct supervision/assist for financial management;A little help with walking and/or transfers   Equipment Recommendations  Other (comment) (defer to next venue)    Recommendations for Other Services      Precautions / Restrictions Precautions Precautions: Fall Restrictions Weight Bearing Restrictions: No       Mobility Bed  Mobility Overal bed mobility: Needs Assistance Bed Mobility: Supine to Sit     Supine to sit: Min guard, HOB elevated     General bed mobility comments: increased time/effort, heavy bed rail use    Transfers Overall transfer level: Needs assistance Equipment used: Rolling walker (2 wheels) Transfers: Sit to/from Stand Sit to Stand: From elevated surface, Min assist                 Balance Overall balance assessment: Needs assistance Sitting-balance support: No upper extremity supported, Feet supported Sitting balance-Leahy Scale: Fair     Standing balance support: Reliant on assistive device for balance, During functional activity Standing balance-Leahy Scale: Fair                             ADL either performed or assessed with clinical judgement   ADL Overall ADL's : Needs assistance/impaired Eating/Feeding: Sitting;Set up   Grooming: Sitting;Wash/dry face;Wash/dry hands;Set up   Upper Body Bathing: Sitting;Minimal assistance;Moderate assistance   Lower Body Bathing: Moderate assistance;Sit to/from stand   Upper Body Dressing : Sitting;Moderate assistance                          Extremity/Trunk Assessment              Vision       Perception     Praxis      Cognition Arousal/Alertness: Awake/alert Behavior During Therapy: WFL for tasks assessed/performed Overall Cognitive Status: Within Functional Limits for tasks assessed  Exercises      Shoulder Instructions       General Comments      Pertinent Vitals/ Pain       Pain Assessment Pain Assessment: Faces Faces Pain Scale: Hurts a little bit Pain Location: R shoulder Pain Descriptors / Indicators: Discomfort, Constant, Grimacing Pain Intervention(s): Limited activity within patient's tolerance, Monitored during session, Repositioned  Home Living                                           Prior Functioning/Environment              Frequency  Min 1X/week        Progress Toward Goals  OT Goals(current goals can now be found in the care plan section)  Progress towards OT goals: Progressing toward goals  Acute Rehab OT Goals Patient Stated Goal: get better OT Goal Formulation: With patient Time For Goal Achievement: 10/12/22 Potential to Achieve Goals: Good  Plan Discharge plan remains appropriate;Frequency remains appropriate    Co-evaluation                 AM-PAC OT "6 Clicks" Daily Activity     Outcome Measure   Help from another person eating meals?: None Help from another person taking care of personal grooming?: A Little Help from another person toileting, which includes using toliet, bedpan, or urinal?: A Little Help from another person bathing (including washing, rinsing, drying)?: A Lot Help from another person to put on and taking off regular upper body clothing?: A Lot Help from another person to put on and taking off regular lower body clothing?: A Lot 6 Click Score: 16    End of Session Equipment Utilized During Treatment: Rolling walker (2 wheels)  OT Visit Diagnosis: Other abnormalities of gait and mobility (R26.89);Pain;History of falling (Z91.81) Pain - Right/Left: Right Pain - part of body: Shoulder;Arm;Hand   Activity Tolerance Patient tolerated treatment well   Patient Left in chair;with call bell/phone within reach;with chair alarm set;with nursing/sitter in room   Nurse Communication Mobility status;Other (comment) (bed saturated from night shift)        Time: 2595-6387 OT Time Calculation (min): 17 min  Charges: OT General Charges $OT Visit: 1 Visit OT Treatments $Self Care/Home Management : 8-22 mins  Arman Filter., MPH, MS, OTR/L ascom [phone] 10/02/22, 10:04 AM

## 2022-10-02 NOTE — Plan of Care (Signed)

## 2022-10-02 NOTE — Progress Notes (Signed)
Physical Therapy Treatment Patient Details Name: Gail Paul MRN: [id number] DOB: 12-Mar-1933 Today's Date: 10/02/2022   History of Present Illness Pt is an 89y/o female presenting with rhabdomyolysis and following a fall at home. PMH includes CHF, CAD, HTN, OA, T2D, Anxiety, and Depression    PT Comments  Pt received on [facility] awaiting assistance. Able to perform self hygiene, ModA to stand after sitting for long period. ModA to complete side step to bedside chair due to weakness throughout. Pt was able to progress mobility with RW ~57ft with MinA for balance and safe maneuvering of AD around objects. Pt assisted back to bed with all needs met. Discussed POC. Pt is currently not at independent baseline LOF and will continue to benefit from acute PT services as well as STR prior to returning home.    If plan is discharge home, recommend the following: Direct supervision/assist for financial management;Assistance with cooking/housework;Assist for transportation;Assistance with feeding;Help with stairs or ramp for entrance;Direct supervision/assist for medications management;A little help with walking and/or transfers;A lot of help with bathing/dressing/bathroom   Can travel by private vehicle     No  Equipment Recommendations  Other (comment) (TBD at next facility)    Recommendations for Other Services       Precautions / Restrictions Precautions Precautions: Fall Restrictions Weight Bearing Restrictions: No     Mobility  Bed Mobility Overal bed mobility: Needs Assistance Bed Mobility: Sit to Supine       Sit to supine: Mod assist (for B LE's)        Transfers Overall transfer level: Needs assistance Equipment used: Rolling walker (2 wheels) Transfers: Sit to/from Stand Sit to Stand: Min assist, Mod assist           General transfer comment:  (vc's for hand placement and technique)    Ambulation/Gait Ambulation/Gait assistance: Min guard Gait Distance (Feet):   (40) Assistive device: Rolling walker (2 wheels) Gait Pattern/deviations: Step-to pattern, Trunk flexed, Decreased step length - right, Decreased step length - left Gait velocity: decreased     General Gait Details: Remains a high fall risk, repeated vc's for safe use of RW and maneuvering around objects   Stairs             Wheelchair Mobility     Tilt Bed    Modified Rankin (Stroke Patients Only)       Balance Overall balance assessment: Needs assistance Sitting-balance support: No upper extremity supported, Feet supported Sitting balance-Leahy Scale: Fair     Standing balance support: Reliant on assistive device for balance, During functional activity Standing balance-Leahy Scale: Fair Standing balance comment:  (Unsafe to ambulate without support of RW and staff present)                            Cognition Arousal/Alertness: Awake/alert Behavior During Therapy: WFL for tasks assessed/performed Overall Cognitive Status: Within Functional Limits for tasks assessed                                 General Comments: A&Ox4        Exercises General Exercises - Lower Extremity Ankle Circles/Pumps: AROM, Both, 10 reps, Seated Long Arc Quad: AROM, Both, 10 reps, Seated Hip ABduction/ADduction: AROM, Both, 10 reps, Seated Hip Flexion/Marching: AROM, Both, 10 reps, Seated Other Exercises Other Exercises: Pt educated on role of PT and benefits of continued PT post  d/c to facilitate return to functional baseline    General Comments General comments (skin integrity, edema, etc.): SpO2 96% on RA upon exertion, increased fatigue and minimal SOB      Pertinent Vitals/Pain Pain Assessment Pain Assessment: 0-10 Pain Score: 4  Pain Location:  (Back) Pain Descriptors / Indicators: Discomfort, Constant, Grimacing Pain Intervention(s): Monitored during session    Home Living                          Prior Function             PT Goals (current goals can now be found in the care plan section) Acute Rehab PT Goals Patient Stated Goal: return home Progress towards PT goals: Progressing toward goals    Frequency    Min 1X/week      PT Plan Current plan remains appropriate    Co-evaluation              AM-PAC PT "6 Clicks" Mobility   Outcome Measure  Help needed turning from your back to your side while in a flat bed without using bedrails?: A Little Help needed moving from lying on your back to sitting on the side of a flat bed without using bedrails?: A Little Help needed moving to and from a bed to a chair (including a wheelchair)?: A Little Help needed standing up from a chair using your arms (e.g., wheelchair or bedside chair)?: A Little Help needed to walk in hospital room?: A Lot Help needed climbing 3-5 steps with a railing? : Total 6 Click Score: 15    End of Session Equipment Utilized During Treatment: Gait belt Activity Tolerance: Patient tolerated treatment well Patient left: in bed;with call bell/phone within reach;with bed alarm set Nurse Communication: Mobility status;Other (comment) (No IV access since this am) PT Visit Diagnosis: Other abnormalities of gait and mobility (R26.89);Repeated falls (R29.6);Difficulty in walking, not elsewhere classified (R26.2);Muscle weakness (generalized) (M62.81)     Time: 6301-6010 PT Time Calculation (min) (ACUTE ONLY): 41 min  Charges:    $Gait Training: 8-22 mins $Therapeutic Exercise: 8-22 mins $Therapeutic Activity: 8-22 mins PT General Charges $$ ACUTE PT VISIT: 1 Visit                    Zadie Cleverly, PTA  [name] 10/02/2022, 3:53 PM

## 2022-10-03 DIAGNOSIS — T796XXA Traumatic ischemia of muscle, initial encounter: Secondary | ICD-10-CM | POA: Diagnosis not present

## 2022-10-03 LAB — BASIC METABOLIC PANEL
Anion gap: 5 (ref 5–15)
BUN: 26 mg/dL — ABNORMAL HIGH (ref 8–23)
CO2: 26 mmol/L (ref 22–32)
Calcium: 8.5 mg/dL — ABNORMAL LOW (ref 8.9–10.3)
Chloride: 108 mmol/L (ref 98–111)
Creatinine, Ser: 1.02 mg/dL — ABNORMAL HIGH (ref 0.44–1.00)
GFR, Estimated: 53 mL/min — ABNORMAL LOW (ref >60)
Glucose, Bld: 102 mg/dL — ABNORMAL HIGH (ref 70–99)
Potassium: 4.1 mmol/L (ref 3.5–5.1)
Sodium: 139 mmol/L (ref 135–145)

## 2022-10-03 LAB — MAGNESIUM: Magnesium: 1.6 mg/dL — ABNORMAL LOW (ref 1.7–2.4)

## 2022-10-03 LAB — CBC
HCT: 32.7 % — ABNORMAL LOW (ref 36.0–46.0)
Hemoglobin: 10.9 g/dL — ABNORMAL LOW (ref 12.0–15.0)
MCH: 30.5 pg (ref 26.0–34.0)
MCHC: 33.3 g/dL (ref 30.0–36.0)
MCV: 91.6 fL (ref 80.0–100.0)
Platelets: 223 10*3/uL (ref 150–400)
RBC: 3.57 MIL/uL — ABNORMAL LOW (ref 3.87–5.11)
RDW: 12.4 % (ref 11.5–15.5)
WBC: 5.9 10*3/uL (ref 4.0–10.5)
nRBC: 0 % (ref 0.0–0.2)

## 2022-10-03 MED ORDER — MAGNESIUM SULFATE 2 GM/50ML IV SOLN
2.0000 g | Freq: Once | INTRAVENOUS | Status: AC
  Administered 2022-10-03: 2 g via INTRAVENOUS
  Filled 2022-10-03: qty 50

## 2022-10-03 NOTE — Plan of Care (Signed)

## 2022-10-03 NOTE — TOC Progression Note (Signed)
Transition of Care [facility]) - Progression Note    Patient Details  Name: Gail Paul MRN: [id number] Date of Birth: [birth year]  [facility] of [facility]) CM/SW Contact  Truddie Hidden, RN Phone Number: 10/03/2022, 12:13 PM  Clinical Narrative:    Patient given bed offers for [facility], [facility], and [facility]. She would like time to discuss bed offers with her friend before making a decision.          Expected Discharge Plan and Services                                               Social Determinants of Health (SDOH) Interventions SDOH Screenings   Tobacco Use: Low Risk  (09/22/2022)    Readmission Risk Interventions     No data to display

## 2022-10-03 NOTE — Progress Notes (Addendum)
Physical Therapy Treatment Patient Details Name: Gail Paul MRN: [id number] DOB: 03-08-1933 Today's Date: 10/03/2022   History of Present Illness Pt is an 89y/o female presenting with rhabdomyolysis and following a fall at home. PMH includes CHF, CAD, HTN, OA, T2D, Anxiety, and Depression    PT Comments  Pt presents laying in bed, no complaints of pain but R arm remains swollen. Pt able to perform supine>sit with minA and improved contribution to movement, only relying assistance to complete task. She also performed sit<>stand and ambulated ~74ft with minA, RW, and 2 rest breaks. She was able to transfer from recliner<>BSC for toileting with minA and RW, but independently performed hygiene. Pt required verbal cueing throughout session for hand placement on RW with transfers.  Pt also noted some lightheadedness with standing that resolved quickly, B/L UE heaviness with walking, and some wooziness following activity. RN notified of wooziness and encouraged to check in at a later time, pt in no acute distress at end of session.    If plan is discharge home, recommend the following: Assistance with cooking/housework;Assist for transportation;Assistance with feeding;Help with stairs or ramp for entrance;Direct supervision/assist for medications management;A little help with walking and/or transfers;A little help with bathing/dressing/bathroom   Can travel by private vehicle     No  Equipment Recommendations  Other (comment) (TBD at next facility)    Recommendations for Other Services       Precautions / Restrictions Precautions Precautions: Fall Restrictions Weight Bearing Restrictions: No     Mobility  Bed Mobility Overal bed mobility: Needs Assistance Bed Mobility: Supine to Sit     Supine to sit: HOB elevated, Min assist, Min guard     General bed mobility comments: increased effort, required minA for completion of movement    Transfers Overall transfer level: Needs  assistance Equipment used: Rolling walker (2 wheels) Transfers: Sit to/from Stand Sit to Stand: Min assist           General transfer comment: repeated verbal cues for hand placement on RW    Ambulation/Gait Ambulation/Gait assistance: Min guard Gait Distance (Feet): 80 Feet Assistive device: Rolling walker (2 wheels) Gait Pattern/deviations: Trunk flexed, Decreased stride length Gait velocity: decreased     General Gait Details: complaints of arm heaviness while walking and some wooziness when returned to [employer]     Tilt Bed    Modified Rankin (Stroke Patients Only)       Balance Overall balance assessment: Needs assistance Sitting-balance support: No upper extremity supported, Feet supported Sitting balance-Leahy Scale: Fair Sitting balance - Comments: Posterior lean when distracted while sittting, able to correct with cueing Postural control: Posterior lean Standing balance support: Reliant on assistive device for balance, During functional activity Standing balance-Leahy Scale: Fair Standing balance comment: some dizziness with standing, resolves quickly                            Cognition Arousal/Alertness: Awake/alert Behavior During Therapy: WFL for tasks assessed/performed Overall Cognitive Status: Within Functional Limits for tasks assessed                                          Exercises      General Comments        Pertinent Vitals/Pain  Pain Assessment Pain Assessment: No/denies pain    Home Living                          Prior Function            PT Goals (current goals can now be found in the care plan section) Acute Rehab PT Goals Patient Stated Goal: return home PT Goal Formulation: With patient Time For Goal Achievement: 10/12/22 Potential to Achieve Goals: Good Progress towards PT goals: Progressing toward goals    Frequency     Min 1X/week      PT Plan Current plan remains appropriate    Co-evaluation              AM-PAC PT "6 Clicks" Mobility   Outcome Measure  Help needed turning from your back to your side while in a flat bed without using bedrails?: A Little Help needed moving from lying on your back to sitting on the side of a flat bed without using bedrails?: A Little Help needed moving to and from a bed to a chair (including a wheelchair)?: A Little Help needed standing up from a chair using your arms (e.g., wheelchair or bedside chair)?: A Little Help needed to walk in hospital room?: A Little Help needed climbing 3-5 steps with a railing? : A Lot 6 Click Score: 17    End of Session Equipment Utilized During Treatment: Gait belt Activity Tolerance: Patient tolerated treatment well;Patient limited by fatigue Patient left: in chair;with call bell/phone within reach;with chair alarm set Nurse Communication: Other (comment) (RN notified of wooziness following session and to check-in at later time) PT Visit Diagnosis: Other abnormalities of gait and mobility (R26.89);Repeated falls (R29.6);Difficulty in walking, not elsewhere classified (R26.2);Muscle weakness (generalized) (M62.81)     Time: 1610-9604 PT Time Calculation (min) (ACUTE ONLY): 30 min  Charges:    $Therapeutic Exercise: 8-22 mins $Therapeutic Activity: 8-22 mins PT General Charges $$ ACUTE PT VISIT: 1 Visit                    Bexlee Bergdoll, PT, SPT 3:48 PM,10/03/22

## 2022-10-03 NOTE — Progress Notes (Signed)
  PROGRESS NOTE    Gail Paul  HQI:[id number] DOB: [birth year] DOA: 09/26/2022 PCP: Margaretann Loveless, MD  [id number]  LOS: 7 days   Brief hospital course:   Assessment & Plan: 89 y.o. Caucasian female with medical history significant for CHF, coronary artery disease, hypertension and osteoarthritis, who presented to the emergency room with a fall.  The patient was last heard from 3 days ago.  She lives alone.  Her friend was checking on her today and found her facedown on the ground.  She was complaining of pain in the upper back and the right side of her face.    Traumatic rhabdomyolysis Likely secondary to extended time down.   --CK 1607 on presentation, trended down   Elevated troponin Suspect this is related to supply/demand ischemia in the setting of acute rhabdomyolysis from extended time down.  Patient not having chest pain.  No EKG changes just ACS.     Acute kidney injury Suspect this is acute kidney injury in the setting of hypovolemia/dehydration from extended time down, and rhabdo.  --Cr 1.71 on presentation.  Improved with IVF.  Cr 1.02 this morning, which appears to be the baseline. --oral hydration   Type 2 diabetes mellitus with peripheral neuropathy (HCC) --cont Lyrica --no need for BG checks   Altered mental status Appears improved.   Patient alert and oriented x 3   Essential hypertension --cont amlodipine, Toprol   Dyslipidemia --resume statin after discharge   Anxiety and depression Cont BuSpar and Cymbalta and Atarax   Chronic diastolic Congestive heart failure (HCC) --stable   DVT prophylaxis: Lovenox SQ Code Status: DNR  Family Communication:  Level of care: Telemetry Cardiac Dispo:   The patient is from: home Anticipated d/c is to: SNF rehab Anticipated d/c date is: whenever bed available   Subjective and Interval History:  Pt reported right arm and elbow pain, attributed to the fall, but said it's better  now.   Objective: Vitals:   10/03/22 0340 10/03/22 0740 10/03/22 1146 10/03/22 1606  BP: (!) 128/57 (!) 118/46 (!) 131/51 (!) 138/54  Pulse: (!) 58 (!) 53 (!) 59 (!) 58  Resp: 18 16 16 16   Temp: 97.8 F (36.6 C) 97.6 F (36.4 C) 98.4 F (36.9 C) 98.8 F (37.1 C)  TempSrc:      SpO2: 96% 96% 97% 98%  Weight: 79.4 kg       Intake/Output Summary (Last 24 hours) at 10/03/2022 1640 Last data filed at 10/03/2022 1425 Gross per 24 hour  Intake 480 ml  Output 2 ml  Net 478 ml   Filed Weights   09/26/22 2111 10/02/22 0300 10/03/22 0340  Weight: 71.4 kg 78.2 kg 79.4 kg    Examination:   Constitutional: NAD, AAOx3 HEENT: conjunctivae and lids normal, EOMI CV: No cyanosis.   RESP: normal respiratory effort, on RA Extremities: no erythema, edema in right arm SKIN: warm, dry Neuro: II - XII grossly intact.   Psych: Normal mood and affect.     Data Reviewed: I have personally reviewed labs and imaging studies  Time spent: 35 minutes  Darlin Priestly, MD [facility] If 7PM-7AM, please contact night-coverage 10/03/2022, 4:40 PM

## 2022-10-04 DIAGNOSIS — T796XXA Traumatic ischemia of muscle, initial encounter: Secondary | ICD-10-CM | POA: Diagnosis not present

## 2022-10-04 NOTE — Progress Notes (Signed)
Occupational Therapy Treatment Patient Details Name: Gail Paul MRN: [id number] DOB: 12-31-1933 Today's Date: 10/04/2022   History of present illness Pt is an 89y/o female presenting with rhabdomyolysis and following a fall at home. PMH includes CHF, CAD, HTN, OA, T2D, Anxiety, and Depression   OT comments  Upon entering the room, pt supine in bed and sleeping soundly. Pt wakes and does not realize lunch tray is in front of her. Pt needing to use bathroom and performs bed mobility with min A for trunk support to EOB. Pt stands and transfers to [facility] with min A for urgent toilet need. Pt performs hygiene and clothing management with sit <>Stand and min HHA. Several steps to recliner chair with min A and lunch tray placed in front of her with all needs within reach. Chair alarm activated.    Recommendations for follow up therapy are one component of a multi-disciplinary discharge planning process, led by the attending physician.  Recommendations may be updated based on patient status, additional functional criteria and insurance authorization.    Assistance Recommended at Discharge Frequent or constant Supervision/Assistance  Patient can return home with the following  A lot of help with bathing/dressing/bathroom;Assistance with cooking/housework;Help with stairs or ramp for entrance;Assist for transportation;Direct supervision/assist for medications management;Direct supervision/assist for financial management;A little help with walking and/or transfers   Equipment Recommendations  Other (comment) (defer to next venue of care)       Precautions / Restrictions Precautions Precautions: Fall Restrictions Weight Bearing Restrictions: No       Mobility Bed Mobility Overal bed mobility: Needs Assistance Bed Mobility: Supine to Sit     Supine to sit: HOB elevated, Min assist          Transfers Overall transfer level: Needs assistance Equipment used: 1 person hand held  assist Transfers: Sit to/from Stand, Bed to chair/wheelchair/BSC Sit to Stand: Min assist                 Balance Overall balance assessment: Needs assistance Sitting-balance support: No upper extremity supported, Feet supported, Single extremity supported Sitting balance-Leahy Scale: Good     Standing balance support: Reliant on assistive device for balance, During functional activity Standing balance-Leahy Scale: Fair                             ADL either performed or assessed with clinical judgement   ADL Overall ADL's : Needs assistance/impaired                         Toilet Transfer: Minimal assistance;BSC/3in1   Toileting- Clothing Manipulation and Hygiene: Minimal assistance;Sit to/from stand              Extremity/Trunk Assessment              Vision Baseline Vision/History: 1 Wears glasses Patient Visual Report: No change from baseline            Cognition Arousal/Alertness: Awake/alert Behavior During Therapy: WFL for tasks assessed/performed Overall Cognitive Status: Within Functional Limits for tasks assessed                                                     Pertinent Vitals/ Pain       Pain Assessment Pain Assessment: No/denies  pain         Frequency  Min 1X/week        Progress Toward Goals  OT Goals(current goals can now be found in the care plan section)  Progress towards OT goals: Progressing toward goals     Plan Discharge plan remains appropriate;Frequency remains appropriate       AM-PAC OT "6 Clicks" Daily Activity     Outcome Measure   Help from another person eating meals?: None Help from another person taking care of personal grooming?: A Little Help from another person toileting, which includes using toliet, bedpan, or urinal?: A Little Help from another person bathing (including washing, rinsing, drying)?: A Little Help from another person to put on and taking  off regular upper body clothing?: A Little Help from another person to put on and taking off regular lower body clothing?: A Little 6 Click Score: 19    End of Session    OT Visit Diagnosis: Other abnormalities of gait and mobility (R26.89);Pain;History of falling (Z91.81) Pain - Right/Left: Right Pain - part of body: Shoulder;Arm;Hand   Activity Tolerance Patient tolerated treatment well   Patient Left in chair;with call bell/phone within reach;with chair alarm set;with nursing/sitter in room   Nurse Communication Mobility status        Time: 1610-9604 OT Time Calculation (min): 14 min  Charges: OT General Charges $OT Visit: 1 Visit OT Treatments $Self Care/Home Management : 8-22 mins  Jackquline Denmark, MS, OTR/L , CBIS ascom [phone]  10/04/22, 2:37 PM

## 2022-10-04 NOTE — TOC Progression Note (Signed)
Transition of Care [facility]) - Progression Note    Patient Details  Name: Gail Paul MRN: [id number] Date of Birth: [birth year]  [facility]) CM/SW Contact  Truddie Hidden, RN Phone Number: 10/04/2022, 6:05 PM  Clinical Narrative:    Vesta Mixer pending         Expected Discharge Plan and Services                                               Social Determinants of Health (SDOH) Interventions SDOH Screenings   Tobacco Use: Low Risk  (09/22/2022)    Readmission Risk Interventions     No data to display

## 2022-10-04 NOTE — Progress Notes (Signed)
  PROGRESS NOTE    Gail Paul  NWG:[id number] DOB: 1933/10/30 DOA: 09/26/2022 PCP: Margaretann Loveless, MD  [id number]  LOS: 8 days   Brief hospital course:   Assessment & Plan: 89 y.o. Caucasian female with medical history significant for CHF, coronary artery disease, hypertension and osteoarthritis, who presented to the emergency room with a fall.  The patient was last heard from 3 days ago.  She lives alone.  Her friend was checking on her today and found her facedown on the ground.  She was complaining of pain in the upper back and the right side of her face.    Traumatic rhabdomyolysis Likely secondary to extended time down.   --CK 1607 on presentation, trended down   Elevated troponin Suspect this is related to supply/demand ischemia in the setting of acute rhabdomyolysis from extended time down.  Patient not having chest pain.  No EKG changes just ACS.     Acute kidney injury Suspect this is acute kidney injury in the setting of hypovolemia/dehydration from extended time down, and rhabdo.  --Cr 1.71 on presentation.  Improved with IVF to Cr 1.02, which appears to be the baseline. --oral hydration   Type 2 diabetes mellitus with peripheral neuropathy (HCC) --cont Lyrica --no need for BG checks   Altered mental status Appears improved.   Patient alert and oriented x 3   Essential hypertension --cont amlodipine, Toprol   Dyslipidemia --resume statin after discharge   Anxiety and depression Cont BuSpar and Cymbalta and Atarax   Chronic diastolic Congestive heart failure (HCC) --stable   DVT prophylaxis: Lovenox SQ Code Status: DNR  Family Communication:  Level of care: Telemetry Cardiac Dispo:   The patient is from: home Anticipated d/c is to: SNF rehab Anticipated d/c date is: whenever bed available   Subjective and Interval History:  No new event.   Objective: Vitals:   10/04/22 0439 10/04/22 0805 10/04/22 1232 10/04/22 1708  BP: 132/75 (!) 153/76  (!) 129/49 (!) 141/63  Pulse: (!) 58 (!) 54 60 (!) 56  Resp: 18 16 16 16   Temp: 98.1 F (36.7 C) 98.4 F (36.9 C) 98.2 F (36.8 C) 97.6 F (36.4 C)  TempSrc:      SpO2: 100% 95% 96% 98%  Weight:        Intake/Output Summary (Last 24 hours) at 10/04/2022 1845 Last data filed at 10/04/2022 1500 Gross per 24 hour  Intake 640 ml  Output --  Net 640 ml   Filed Weights   09/26/22 2111 10/02/22 0300 10/03/22 0340  Weight: 71.4 kg 78.2 kg 79.4 kg    Examination:   Constitutional: NAD CV: No cyanosis.   RESP: normal respiratory effort, on RA   Data Reviewed: I have personally reviewed labs and imaging studies  Time spent: 25 minutes  Darlin Priestly, MD [facility] If 7PM-7AM, please contact night-coverage 10/04/2022, 6:45 PM

## 2022-10-04 NOTE — Progress Notes (Addendum)
Physical Therapy Treatment Patient Details Name: Gail Paul MRN: [id number] DOB: 04/09/1933 Today's Date: 10/04/2022   History of Present Illness Pt is an 89y/o female presenting with rhabdomyolysis and following a fall at home. PMH includes CHF, CAD, HTN, OA, T2D, Anxiety, and Depression    PT Comments  Pt presents laying in bed, no complaints of pain. Orthostatics assessed due to complaints of wooziness during previous session but were unremarkable.    Pt able to perform supine > sit with minA, continuing to exhibit difficulty with transition from sidelying to sit and proper hand placement. Pt able to perform sit<>stand and transfer to [facility] with CGA and RW as well as independently perform hygiene. She required continuous cueing for proper hand placement and exhibited decreased safety awareness with transfers throughout session. Pt tolerated ambulating ~368ft with RW and CGA for safety, noting improved UE tolerance during activity. Pt attempted ambulating without RW but was unable to tolerate due to balance deficits and fatigue. Pt would continue to benefit from continued skilled therapy to maximize functional independence.    If plan is discharge home, recommend the following: Assistance with cooking/housework;Assist for transportation;Assistance with feeding;Help with stairs or ramp for entrance;Direct supervision/assist for medications management;A little help with walking and/or transfers;A little help with bathing/dressing/bathroom   Can travel by private vehicle     Yes  Equipment Recommendations  Other (comment) (TBD at next facility)    Recommendations for Other Services       Precautions / Restrictions Precautions Precautions: Fall Restrictions Weight Bearing Restrictions: No     Mobility  Bed Mobility Overal bed mobility: Needs Assistance Bed Mobility: Supine to Sit     Supine to sit: HOB elevated, Min assist     General bed mobility comments: improved contribution  to movement, continues to have difficulty with sidelying> sit transiton and hand placement    Transfers Overall transfer level: Needs assistance Equipment used: Rolling walker (2 wheels) Transfers: Sit to/from Stand, Bed to chair/wheelchair/BSC Sit to Stand: Min guard           General transfer comment: repeated verbal cues for hand placement on RW, CGA for safety    Ambulation/Gait Ambulation/Gait assistance: Min guard Gait Distance (Feet): 320 Feet Assistive device: Rolling walker (2 wheels) Gait Pattern/deviations: Trunk flexed, Decreased stride length Gait velocity: decreased     General Gait Details: Improved UE tolerance, requires VC for keeping RW close to body. Attempted to walk without RW but pt unable to tolerate   Stairs             Wheelchair Mobility     Tilt Bed    Modified Rankin (Stroke Patients Only)       Balance Overall balance assessment: Needs assistance Sitting-balance support: No upper extremity supported, Feet supported, Single extremity supported Sitting balance-Leahy Scale: Good Sitting balance - Comments: able to maintain upright EOB and while using BSC   Standing balance support: Reliant on assistive device for balance, During functional activity Standing balance-Leahy Scale: Fair Standing balance comment: able to balance with 1 hand on RW while blood pressure was taken                            Cognition Arousal/Alertness: Awake/alert Behavior During Therapy: WFL for tasks assessed/performed Overall Cognitive Status: Within Functional Limits for tasks assessed  Exercises      General Comments        Pertinent Vitals/Pain Pain Assessment Pain Assessment: No/denies pain    Home Living                          Prior Function            PT Goals (current goals can now be found in the care plan section) Acute Rehab PT Goals Patient  Stated Goal: return home PT Goal Formulation: With patient Time For Goal Achievement: 10/12/22 Potential to Achieve Goals: Good Progress towards PT goals: Progressing toward goals    Frequency    Min 1X/week      PT Plan Current plan remains appropriate    Co-evaluation              AM-PAC PT "6 Clicks" Mobility   Outcome Measure  Help needed turning from your back to your side while in a flat bed without using bedrails?: A Little Help needed moving from lying on your back to sitting on the side of a flat bed without using bedrails?: A Little Help needed moving to and from a bed to a chair (including a wheelchair)?: A Little Help needed standing up from a chair using your arms (e.g., wheelchair or bedside chair)?: A Little Help needed to walk in hospital room?: A Little Help needed climbing 3-5 steps with a railing? : A Lot 6 Click Score: 17    End of Session Equipment Utilized During Treatment: Gait belt Activity Tolerance: Patient tolerated treatment well Patient left: in chair;with call bell/phone within reach;with chair alarm set   PT Visit Diagnosis: Other abnormalities of gait and mobility (R26.89);Repeated falls (R29.6);Difficulty in walking, not elsewhere classified (R26.2);Muscle weakness (generalized) (M62.81)     Time: 8841-6606 PT Time Calculation (min) (ACUTE ONLY): 25 min  Charges:    $Therapeutic Activity: 23-37 mins PT General Charges $$ ACUTE PT VISIT: 1 Visit                    Braleigh Massoud, PT, SPT 1:02 PM,10/04/22

## 2022-10-04 NOTE — Care Management Important Message (Signed)
Important Message  Patient Details  Name: Gail Paul MRN: [id number] Date of Birth: Feb 25, 1934   Medicare Important Message Given:  Yes  I reviewed the form with the patient and obtained her signature. I will forward to Health Information Management to be scanned into her medical record. I wished her a speedy recovery and thanked her for her time.   Olegario Messier A Kenneisha Cochrane 10/04/2022, 12:03 PM

## 2022-10-05 DIAGNOSIS — T796XXA Traumatic ischemia of muscle, initial encounter: Secondary | ICD-10-CM | POA: Diagnosis not present

## 2022-10-05 MED ORDER — ACETAMINOPHEN 500 MG PO TABS
1000.0000 mg | ORAL_TABLET | Freq: Three times a day (TID) | ORAL | Status: DC | PRN
  Administered 2022-10-05: 1000 mg via ORAL
  Filled 2022-10-05: qty 2

## 2022-10-05 NOTE — Progress Notes (Signed)
  PROGRESS NOTE    MICAL BRUN  [id number] DOB: 09/30/1933 DOA: 09/26/2022 PCP: Margaretann Loveless, MD  233A/233A-AA  LOS: 9 days   Brief hospital course:   Assessment & Plan: 89 y.o. Caucasian female with medical history significant for CHF, coronary artery disease, hypertension and osteoarthritis, who presented to the emergency room with a fall.  The patient was last heard from 3 days ago.  She lives alone.  Her friend was checking on her today and found her facedown on the ground.  She was complaining of pain in the upper back and the right side of her face.    Traumatic rhabdomyolysis Likely secondary to extended time down.   --CK 1607 on presentation, trended down   Elevated troponin Suspect this is related to supply/demand ischemia in the setting of acute rhabdomyolysis from extended time down.  Patient not having chest pain.  No EKG changes just ACS.     Acute kidney injury Suspect this is acute kidney injury in the setting of hypovolemia/dehydration from extended time down, and rhabdo.  --Cr 1.71 on presentation.  Improved with IVF to Cr 1.02, which appears to be the baseline. --oral hydration   Type 2 diabetes mellitus with peripheral neuropathy (HCC) --cont Lyrica --no need for BG checks   Altered mental status Appears improved.   Patient alert and oriented x 3   Essential hypertension --cont amlodipine, Toprol   Dyslipidemia --resume statin after discharge   Anxiety and depression Cont BuSpar and Cymbalta and Atarax   Chronic diastolic Congestive heart failure (HCC) --stable   DVT prophylaxis: Lovenox SQ Code Status: DNR  Family Communication:  Level of care: Telemetry Cardiac Dispo:   The patient is from: home Anticipated d/c is to: SNF rehab Anticipated d/c date is: whenever bed available   Subjective and Interval History:  Pt reported bowel incontinence today with liquid stool, which is unusual for her.   Objective: Vitals:   10/05/22 0733  10/05/22 0933 10/05/22 1240 10/05/22 1603  BP: (!) 150/64  (!) 128/58 (!) 119/51  Pulse: (!) 54 61 (!) 54 (!) 55  Resp: 18  18 18   Temp: 97.6 F (36.4 C)  97.6 F (36.4 C) 98 F (36.7 C)  TempSrc:      SpO2: 96%  97% 96%  Weight:        Intake/Output Summary (Last 24 hours) at 10/05/2022 1733 Last data filed at 10/05/2022 1424 Gross per 24 hour  Intake 360 ml  Output 150 ml  Net 210 ml   Filed Weights   09/26/22 2111 10/02/22 0300 10/03/22 0340  Weight: 71.4 kg 78.2 kg 79.4 kg    Examination:   Constitutional: NAD, AAOx3 HEENT: conjunctivae and lids normal, EOMI CV: No cyanosis.   RESP: normal respiratory effort, on RA Neuro: II - XII grossly intact.   Psych: Normal mood and affect.  Appropriate judgement and reason   Data Reviewed: I have personally reviewed labs and imaging studies  Time spent: 25 minutes  Darlin Priestly, MD [facility] If 7PM-7AM, please contact night-coverage 10/05/2022, 5:33 PM

## 2022-10-05 NOTE — Plan of Care (Signed)

## 2022-10-05 NOTE — Progress Notes (Signed)
Occupational Therapy Treatment Patient Details Name: Gail Paul MRN: [id number] DOB: 02-03-1934 Today's Date: 10/05/2022   History of present illness Pt is an 89y/o female presenting with rhabdomyolysis and following a fall at home. PMH includes CHF, CAD, HTN, OA, T2D, Anxiety, and Depression   OT comments  Pt seen for OT tx. Pt sleeping with lights off upon OT's arrival. VC to wake. Bed flattened and pt able to complete posterior scooting with VC for bed rail use to improve positioning in bed for pressure relief and comfort. Pt noted she is fatigued from earlier bath with nurse tech after an episode of bowel and bladder incontinence. Sensation testing was normal. MD and RN notified. Further ADL/mobility deferred 2/2 symptoms reported. Will continue to progress as able.    Recommendations for follow up therapy are one component of a multi-disciplinary discharge planning process, led by the attending physician.  Recommendations may be updated based on patient status, additional functional criteria and insurance authorization.    Assistance Recommended at Discharge Frequent or constant Supervision/Assistance  Patient can return home with the following  A lot of help with bathing/dressing/bathroom;Assistance with cooking/housework;Help with stairs or ramp for entrance;Assist for transportation;Direct supervision/assist for medications management;Direct supervision/assist for financial management;A little help with walking and/or transfers   Equipment Recommendations  Other (comment) (defer to next venue)    Recommendations for Other Services      Precautions / Restrictions Precautions Precautions: Fall Restrictions Weight Bearing Restrictions: No       Mobility Bed Mobility               General bed mobility comments: pt able to reposition with bed flat and VC to use bed rails    Transfers                   General transfer comment: deferred 2/2 fatigue and pt  reported symptoms     Balance                                           ADL either performed or assessed with clinical judgement   ADL                                              Extremity/Trunk Assessment              Vision       Perception     Praxis      Cognition Arousal/Alertness: Lethargic Behavior During Therapy: WFL for tasks assessed/performed Overall Cognitive Status: Within Functional Limits for tasks assessed                                 General Comments: groggy initially but improves with time and lights on        Exercises      Shoulder Instructions       General Comments      Pertinent Vitals/ Pain       Pain Assessment Pain Assessment: Faces Faces Pain Scale: Hurts a little bit Pain Location: RUE Pain Descriptors / Indicators: Discomfort Pain Intervention(s): Monitored during session, Repositioned  Home Living  Prior Functioning/Environment              Frequency  Min 1X/week        Progress Toward Goals  OT Goals(current goals can now be found in the care plan section)  Progress towards OT goals: Progressing toward goals  Acute Rehab OT Goals Patient Stated Goal: get better OT Goal Formulation: With patient Time For Goal Achievement: 10/12/22 Potential to Achieve Goals: Good  Plan Discharge plan remains appropriate;Frequency remains appropriate    Co-evaluation                 AM-PAC OT "6 Clicks" Daily Activity     Outcome Measure   Help from another person eating meals?: None Help from another person taking care of personal grooming?: A Little Help from another person toileting, which includes using toliet, bedpan, or urinal?: A Little Help from another person bathing (including washing, rinsing, drying)?: A Little Help from another person to put on and taking off regular upper body  clothing?: A Little Help from another person to put on and taking off regular lower body clothing?: A Little 6 Click Score: 19    End of Session    OT Visit Diagnosis: Other abnormalities of gait and mobility (R26.89);Pain;History of falling (Z91.81) Pain - Right/Left: Right Pain - part of body: Shoulder;Arm;Hand   Activity Tolerance Treatment limited secondary to medical complications (Comment)   Patient Left in bed;with call bell/phone within reach;with bed alarm set   Nurse Communication Other (comment) (MD and RN: recent incontinence of bowel and bladder)        Time: 1157-1209 OT Time Calculation (min): 12 min  Charges: OT General Charges $OT Visit: 1 Visit OT Treatments $Therapeutic Activity: 8-22 mins  Arman Filter., MPH, MS, OTR/L ascom [phone] 10/05/22, 1:02 PM

## 2022-10-05 NOTE — TOC Progression Note (Addendum)
Transition of Care [facility]) - Progression Note    Patient Details  Name: Gail Paul MRN: [id number] Date of Birth: 02-23-1934  Transition of Care [facility]) CM/SW Contact  Truddie Hidden, RN Phone Number: 10/05/2022, 3:04 PM  Clinical Narrative:    Per [employer] portal patient approved 8/2-8/6 for [facility]  #[id number]. MD notified.  Attempt to reach [facility]. No answer left a message regarding patients acceptance for tomorrow. Left message with assigned TOC contact information for tomorrow.   3:50pm Retrieved call from  [place]. Patient will have a $20 /day copayment. $400 upfront is needed. Spoke with patient. She is agreeable and will have her friend to pay it for her. Per [place] patient can not be accepted this weekend due to staffing.       Expected Discharge Plan and Services                                               Social Determinants of Health (SDOH) Interventions SDOH Screenings   Tobacco Use: Low Risk  (09/22/2022)    Readmission Risk Interventions     No data to display

## 2022-10-05 NOTE — Plan of Care (Signed)
  Problem: Education: ?Goal: Knowledge of General Education information will improve ?Description: Including pain rating scale, medication(s)/side effects and non-pharmacologic comfort measures ?Outcome: Progressing ?  ?Problem: Health Behavior/Discharge Planning: ?Goal: Ability to manage health-related needs will improve ?Outcome: Progressing ?  ?Problem: Clinical Measurements: ?Goal: Will remain free from infection ?Outcome: Progressing ?Goal: Diagnostic test results will improve ?Outcome: Progressing ?Goal: Cardiovascular complication will be avoided ?Outcome: Progressing ?  ?Problem: Activity: ?Goal: Risk for activity intolerance will decrease ?Outcome: Progressing ?  ?

## 2022-10-06 ENCOUNTER — Encounter: Payer: Self-pay | Admitting: Family Medicine

## 2022-10-06 ENCOUNTER — Other Ambulatory Visit: Payer: Self-pay

## 2022-10-06 DIAGNOSIS — T796XXA Traumatic ischemia of muscle, initial encounter: Secondary | ICD-10-CM | POA: Diagnosis not present

## 2022-10-06 MED ORDER — ENSURE ENLIVE PO LIQD
237.0000 mL | Freq: Two times a day (BID) | ORAL | Status: DC
  Administered 2022-10-07 – 2022-10-08 (×3): 237 mL via ORAL

## 2022-10-06 NOTE — Progress Notes (Signed)
  PROGRESS NOTE    Gail Paul  UYQ:[id number] DOB: [birth year] DOA: 09/26/2022 PCP: Margaretann Loveless, MD  252A/252A-AA  LOS: 10 days   Brief hospital course:   Assessment & Plan: 89 y.o. Caucasian female with medical history significant for CHF, coronary artery disease, hypertension and osteoarthritis, who presented to the emergency room with a fall.  The patient was last heard from 3 days ago.  She lives alone.  Her friend was checking on her today and found her facedown on the ground.  She was complaining of pain in the upper back and the right side of her face.    Traumatic rhabdomyolysis Likely secondary to extended time down.   --CK 1607 on presentation, trended down   Elevated troponin Suspect this is related to supply/demand ischemia in the setting of acute rhabdomyolysis from extended time down.  Patient not having chest pain.  No EKG changes just ACS.     Acute kidney injury Suspect this is acute kidney injury in the setting of hypovolemia/dehydration from extended time down, and rhabdo.  --Cr 1.71 on presentation.  Improved with IVF to Cr 1.02, which appears to be the baseline. --oral hydration   Type 2 diabetes mellitus with peripheral neuropathy (HCC) --cont Lyrica --no need for BG checks   Altered mental status Appears improved.   Patient alert and oriented x 3   Essential hypertension --cont amlodipine, Toprol   Dyslipidemia --resume statin after discharge   Anxiety and depression Cont BuSpar and Cymbalta and Atarax   Chronic diastolic Congestive heart failure (HCC) --stable   DVT prophylaxis: Lovenox SQ Code Status: DNR  Family Communication:  Level of care: Med-Surg Dispo:   The patient is from: home Anticipated d/c is to: SNF rehab Anticipated d/c date is: whenever bed available   Subjective and Interval History:  No new event today.   Objective: Vitals:   10/06/22 0420 10/06/22 0807 10/06/22 0934 10/06/22 1142  BP: (!) 124/55 (!) 166/65   (!) 132/55  Pulse: (!) 51 (!) 52 62 (!) 53  Resp: 17 19  18   Temp: 98.7 F (37.1 C) 98.1 F (36.7 C)  (!) 97.5 F (36.4 C)  TempSrc: Oral Oral  Oral  SpO2: 98% 96%  94%  Weight:        Intake/Output Summary (Last 24 hours) at 10/06/2022 1530 Last data filed at 10/06/2022 1044 Gross per 24 hour  Intake 300 ml  Output --  Net 300 ml   Filed Weights   09/26/22 2111 10/02/22 0300 10/03/22 0340  Weight: 71.4 kg 78.2 kg 79.4 kg    Examination:   Constitutional: NAD CV: No cyanosis.   RESP: normal respiratory effort, on RA   Data Reviewed: I have personally reviewed labs and imaging studies  Time spent: 25 minutes  Darlin Priestly, MD [facility] If 7PM-7AM, please contact night-coverage 10/06/2022, 3:30 PM

## 2022-10-07 DIAGNOSIS — T796XXA Traumatic ischemia of muscle, initial encounter: Secondary | ICD-10-CM | POA: Diagnosis not present

## 2022-10-07 NOTE — Progress Notes (Signed)
  PROGRESS NOTE    Gail Paul  [id number]:[id number] DOB: October 29, 1933 DOA: 09/26/2022 PCP: Margaretann Loveless, MD  117A/117A-AA  LOS: 11 days   Brief hospital course:   Assessment & Plan: 89 y.o. Caucasian female with medical history significant for CHF, coronary artery disease, hypertension and osteoarthritis, who presented to the emergency room with a fall.  The patient was last heard from 3 days ago.  She lives alone.  Her friend was checking on her today and found her facedown on the ground.  She was complaining of pain in the upper back and the right side of her face.    Traumatic rhabdomyolysis Likely secondary to extended time down.   --CK 1607 on presentation, trended down   Elevated troponin Suspect this is related to supply/demand ischemia in the setting of acute rhabdomyolysis from extended time down.  Patient not having chest pain.  No EKG changes just ACS.     Acute kidney injury Suspect this is acute kidney injury in the setting of hypovolemia/dehydration from extended time down, and rhabdo.  --Cr 1.71 on presentation.  Improved with IVF to Cr 1.02, which appears to be the baseline. --oral hydration   Type 2 diabetes mellitus with peripheral neuropathy (HCC) --cont Lyrica --no need for BG checks   Altered mental status Appears improved.   Patient alert and oriented x 3   Essential hypertension --cont amlodipine, Toprol   Dyslipidemia --resume statin after discharge   Anxiety and depression Cont BuSpar and Cymbalta and Atarax   Chronic diastolic Congestive heart failure (HCC) --stable   DVT prophylaxis: Lovenox SQ Code Status: DNR  Family Communication:  Level of care: Med-Surg Dispo:   The patient is from: home Anticipated d/c is to: SNF rehab Anticipated d/c date is: whenever bed available   Subjective and Interval History:  No more bowel incontinence episode.  Reported some pain in her back side.  Requested RN to put foam dressing over  it.   Objective: Vitals:   10/06/22 2103 10/07/22 0435 10/07/22 0740 10/07/22 1532  BP: (!) 159/82 (!) 131/52 129/64 (!) 125/49  Pulse: (!) 59 (!) 51 (!) 49 (!) 59  Resp: 18 18 14 16   Temp: 97.8 F (36.6 C) 97.7 F (36.5 C) 98.5 F (36.9 C) 98.8 F (37.1 C)  TempSrc:  Oral Oral Oral  SpO2: 99% 95% 99% 98%  Weight:      Height:       No intake or output data in the 24 hours ending 10/07/22 1901  Filed Weights   09/26/22 2111 10/02/22 0300 10/03/22 0340  Weight: 71.4 kg 78.2 kg 79.4 kg    Examination:   Constitutional: NAD, AAOx3 HEENT: conjunctivae and lids normal, EOMI CV: No cyanosis.   RESP: normal respiratory effort, on RA SKIN: superficial ulceration over left buttock Neuro: II - XII grossly intact.   Psych: Normal mood and affect.  Appropriate judgement and reason   Data Reviewed: I have personally reviewed labs and imaging studies  Time spent: 25 minutes  Darlin Priestly, MD [facility] If 7PM-7AM, please contact night-coverage 10/07/2022, 7:01 PM

## 2022-10-08 ENCOUNTER — Ambulatory Visit: Payer: Medicare HMO | Admitting: Internal Medicine

## 2022-10-08 DIAGNOSIS — Z9181 History of falling: Secondary | ICD-10-CM | POA: Diagnosis not present

## 2022-10-08 DIAGNOSIS — E1140 Type 2 diabetes mellitus with diabetic neuropathy, unspecified: Secondary | ICD-10-CM | POA: Diagnosis not present

## 2022-10-08 DIAGNOSIS — I110 Hypertensive heart disease with heart failure: Secondary | ICD-10-CM | POA: Diagnosis not present

## 2022-10-08 DIAGNOSIS — Z7902 Long term (current) use of antithrombotics/antiplatelets: Secondary | ICD-10-CM | POA: Diagnosis not present

## 2022-10-08 DIAGNOSIS — Y92009 Unspecified place in unspecified non-institutional (private) residence as the place of occurrence of the external cause: Secondary | ICD-10-CM | POA: Diagnosis not present

## 2022-10-08 DIAGNOSIS — R2681 Unsteadiness on feet: Secondary | ICD-10-CM | POA: Diagnosis not present

## 2022-10-08 DIAGNOSIS — M48061 Spinal stenosis, lumbar region without neurogenic claudication: Secondary | ICD-10-CM | POA: Diagnosis not present

## 2022-10-08 DIAGNOSIS — M6282 Rhabdomyolysis: Secondary | ICD-10-CM | POA: Diagnosis not present

## 2022-10-08 DIAGNOSIS — I959 Hypotension, unspecified: Secondary | ICD-10-CM | POA: Diagnosis not present

## 2022-10-08 DIAGNOSIS — I252 Old myocardial infarction: Secondary | ICD-10-CM | POA: Diagnosis not present

## 2022-10-08 DIAGNOSIS — J4520 Mild intermittent asthma, uncomplicated: Secondary | ICD-10-CM | POA: Diagnosis not present

## 2022-10-08 DIAGNOSIS — R110 Nausea: Secondary | ICD-10-CM | POA: Diagnosis not present

## 2022-10-08 DIAGNOSIS — I5032 Chronic diastolic (congestive) heart failure: Secondary | ICD-10-CM | POA: Diagnosis not present

## 2022-10-08 DIAGNOSIS — F419 Anxiety disorder, unspecified: Secondary | ICD-10-CM | POA: Diagnosis not present

## 2022-10-08 DIAGNOSIS — T796XXA Traumatic ischemia of muscle, initial encounter: Secondary | ICD-10-CM | POA: Diagnosis not present

## 2022-10-08 DIAGNOSIS — I10 Essential (primary) hypertension: Secondary | ICD-10-CM | POA: Diagnosis not present

## 2022-10-08 DIAGNOSIS — M6281 Muscle weakness (generalized): Secondary | ICD-10-CM | POA: Diagnosis not present

## 2022-10-08 DIAGNOSIS — R2689 Other abnormalities of gait and mobility: Secondary | ICD-10-CM | POA: Diagnosis not present

## 2022-10-08 DIAGNOSIS — R5381 Other malaise: Secondary | ICD-10-CM | POA: Diagnosis not present

## 2022-10-08 DIAGNOSIS — I25119 Atherosclerotic heart disease of native coronary artery with unspecified angina pectoris: Secondary | ICD-10-CM | POA: Diagnosis not present

## 2022-10-08 DIAGNOSIS — R269 Unspecified abnormalities of gait and mobility: Secondary | ICD-10-CM | POA: Diagnosis not present

## 2022-10-08 DIAGNOSIS — Z23 Encounter for immunization: Secondary | ICD-10-CM | POA: Diagnosis not present

## 2022-10-08 DIAGNOSIS — T796XXD Traumatic ischemia of muscle, subsequent encounter: Secondary | ICD-10-CM | POA: Diagnosis not present

## 2022-10-08 DIAGNOSIS — Z7982 Long term (current) use of aspirin: Secondary | ICD-10-CM | POA: Diagnosis not present

## 2022-10-08 DIAGNOSIS — R41841 Cognitive communication deficit: Secondary | ICD-10-CM | POA: Diagnosis not present

## 2022-10-08 DIAGNOSIS — E785 Hyperlipidemia, unspecified: Secondary | ICD-10-CM | POA: Diagnosis not present

## 2022-10-08 DIAGNOSIS — R1311 Dysphagia, oral phase: Secondary | ICD-10-CM | POA: Diagnosis not present

## 2022-10-08 DIAGNOSIS — E1142 Type 2 diabetes mellitus with diabetic polyneuropathy: Secondary | ICD-10-CM | POA: Diagnosis not present

## 2022-10-08 DIAGNOSIS — W19XXXA Unspecified fall, initial encounter: Secondary | ICD-10-CM | POA: Diagnosis not present

## 2022-10-08 DIAGNOSIS — Z741 Need for assistance with personal care: Secondary | ICD-10-CM | POA: Diagnosis not present

## 2022-10-08 MED ORDER — PNEUMOCOCCAL 20-VAL CONJ VACC 0.5 ML IM SUSY
0.5000 mL | PREFILLED_SYRINGE | INTRAMUSCULAR | Status: AC
  Administered 2022-10-08: 0.5 mL via INTRAMUSCULAR
  Filled 2022-10-08 (×2): qty 0.5

## 2022-10-08 MED ORDER — ENSURE ENLIVE PO LIQD: 237.0000 mL | Freq: Two times a day (BID) | ORAL | Status: DC

## 2022-10-08 NOTE — Care Management Important Message (Signed)
Important Message  Patient Details  Name: Gail Paul MRN: [id number] Date of Birth: 22-Sep-1933   Medicare Important Message Given:  Yes     Johnell Comings 10/08/2022, 10:11 AM

## 2022-10-08 NOTE — Progress Notes (Signed)
Occupational Therapy Treatment Patient Details Name: Gail Paul MRN: [id number] DOB: 22-Oct-1933 Today's Date: 10/08/2022   History of present illness Pt is an 89y/o female presenting with rhabdomyolysis and following a fall at home. PMH includes CHF, CAD, HTN, OA, T2D, Anxiety, and Depression   OT comments  Pt seated EOB upon OT's arrival with RN for medication administration. Pt demonstrating good sitting balance, endorsing feeling better and eager to improve enough to return home to her pets. Pt required CGA for ADL transfers and ADL mobility in room with RW with fair balance throughout. Pt alert and oriented, much more clear mentally than in previous sessions. Pt demonstrating good progress towards goals. Continues to benefit from skilled OT services.    Recommendations for follow up therapy are one component of a multi-disciplinary discharge planning process, led by the attending physician.  Recommendations may be updated based on patient status, additional functional criteria and insurance authorization.    Assistance Recommended at Discharge Intermittent Supervision/Assistance  Patient can return home with the following  Assistance with cooking/housework;Help with stairs or ramp for entrance;Assist for transportation;Direct supervision/assist for medications management;Direct supervision/assist for financial management;A little help with walking and/or transfers;A little help with bathing/dressing/bathroom   Equipment Recommendations  Other (comment) (defer to next venue)    Recommendations for Other Services      Precautions / Restrictions Precautions Precautions: Fall Restrictions Weight Bearing Restrictions: No       Mobility Bed Mobility               General bed mobility comments: NT, EOB upon OT arrival    Transfers Overall transfer level: Needs assistance Equipment used: Rolling walker (2 wheels) Transfers: Sit to/from Stand Sit to Stand: Min guard                  Balance Overall balance assessment: Needs assistance Sitting-balance support: No upper extremity supported, Feet supported Sitting balance-Leahy Scale: Good     Standing balance support: During functional activity, Bilateral upper extremity supported Standing balance-Leahy Scale: Fair                             ADL either performed or assessed with clinical judgement   ADL Overall ADL's : Needs assistance/impaired Eating/Feeding: Sitting;Set up                       Toilet Transfer: Min guard;BSC/3in1;Rolling walker (2 wheels)   Toileting- Clothing Manipulation and Hygiene: Sit to/from stand;Min guard;Set up              Extremity/Trunk Assessment              Vision       Perception     Praxis      Cognition Arousal/Alertness: Awake/alert Behavior During Therapy: WFL for tasks assessed/performed Overall Cognitive Status: Within Functional Limits for tasks assessed                                          Exercises Other Exercises Other Exercises: Pt ambulated in room wiht RW requiring CGA ~ 10'    Shoulder Instructions       General Comments      Pertinent Vitals/ Pain       Pain Assessment Pain Assessment: No/denies pain  Home Living  Prior Functioning/Environment              Frequency  Min 1X/week        Progress Toward Goals  OT Goals(current goals can now be found in the care plan section)  Progress towards OT goals: Progressing toward goals  Acute Rehab OT Goals Patient Stated Goal: get better OT Goal Formulation: With patient Time For Goal Achievement: 10/12/22 Potential to Achieve Goals: Good  Plan Discharge plan remains appropriate;Frequency remains appropriate    Co-evaluation                 AM-PAC OT "6 Clicks" Daily Activity     Outcome Measure   Help from another person eating meals?:  None Help from another person taking care of personal grooming?: A Little Help from another person toileting, which includes using toliet, bedpan, or urinal?: A Little Help from another person bathing (including washing, rinsing, drying)?: A Little Help from another person to put on and taking off regular upper body clothing?: A Little Help from another person to put on and taking off regular lower body clothing?: A Little 6 Click Score: 19    End of Session Equipment Utilized During Treatment: Rolling walker (2 wheels)  OT Visit Diagnosis: Other abnormalities of gait and mobility (R26.89);History of falling (Z91.81)   Activity Tolerance Patient tolerated treatment well   Patient Left in chair;with call bell/phone within reach;with nursing/sitter in room   Nurse Communication          Time: 4259-5638 OT Time Calculation (min): 9 min  Charges: OT General Charges $OT Visit: 1 Visit OT Treatments $Self Care/Home Management : 8-22 mins  Arman Filter., MPH, MS, OTR/L ascom [phone] 10/08/22, 9:40 AM

## 2022-10-08 NOTE — TOC Transition Note (Signed)
Transition of Care [facility]) - CM/SW Discharge Note   Patient Details  Name: Gail Paul MRN: [id number] Date of Birth: [birth year]  [facility]) CM/SW Contact:  Allena Katz, LCSW Phone Number: 10/08/2022, 9:03 AM   Clinical Narrative:   Pt has orders to discharge to [facility]. Ricky notified. [phone] RN given number for report. [facility] called pt is first on the list.   Final next level of care: Skilled Nursing Facility Barriers to Discharge: Barriers Resolved   Patient Goals and CMS Choice CMS Medicare.gov Compare Post Acute Care list provided to:: Patient Choice offered to / list presented to : Patient  Discharge Placement                         Discharge Plan and Services Additional resources added to the After Visit Summary for                                       Social Determinants of Health (SDOH) Interventions SDOH Screenings   Food Insecurity: No Food Insecurity (10/06/2022)  Housing: Patient Declined (10/06/2022)  Transportation Needs: No Transportation Needs (10/06/2022)  Utilities: Not At Risk (10/06/2022)  Tobacco Use: Low Risk  (10/06/2022)     Readmission Risk Interventions     No data to display

## 2022-10-08 NOTE — Discharge Summary (Signed)
Physician Discharge Summary   Gail Paul  female DOB: 1933-09-30  IRJ:[id number]  PCP: Margaretann Loveless, MD  Admit date: 09/26/2022 Discharge date: 10/08/2022  Admitted From: home Disposition:  SNF rehab CODE STATUS: DNR   Hospital Course:  For full details, please see H&P, progress notes, consult notes and ancillary notes.  Briefly,  Gail Paul is a 89 y.o. Caucasian female with medical history significant for dCHF, coronary artery disease, hypertension and osteoarthritis, who presented to the emergency room with a fall.    The patient was last heard from 3 days prior.  She lives alone.  Her friend was checking on her and found her facedown on the ground.      Traumatic rhabdomyolysis Likely secondary to extended time down.   --CK 1607 on presentation, trended down with IVF.   Elevated troponin Suspect this is related to supply/demand ischemia in the setting of acute rhabdomyolysis from extended time down.  Patient not having chest pain.     Acute kidney injury Suspect 2/2 hypovolemia/dehydration from extended time down, and rhabdo.  --Cr 1.71 on presentation.  Improved with IVF to Cr 1.02, which appears to be the baseline.   Type 2 diabetes mellitus with peripheral neuropathy (HCC) --well controlled.  Last A1c 6.5.  Not taking hypoglycemics PTA. --cont Lyrica   Altered mental status, resolved   Essential hypertension --pt not on Toprol PTA --cont home amlodipine   Dyslipidemia --resume statin after discharge   Anxiety and depression --pt not taking BuSpar and Cymbalta PTA --cont home Atarax   Chronic diastolic Congestive heart failure (HCC) --stable  CAD --cont home ASA, plavix and statin   Unless noted above, medications under "STOP" list are ones pt was not taking PTA.  Discharge Diagnoses:  Principal Problem:   Rhabdomyolysis Active Problems:   Elevated troponin   Type 2 diabetes mellitus with peripheral neuropathy (HCC)   Acute kidney  injury superimposed on chronic kidney disease (HCC)   Essential hypertension   Altered mental status   Congestive heart failure (HCC)   Anxiety and depression   Dyslipidemia   30 Day Unplanned Readmission Risk Score    Flowsheet Row ED to Hosp-Admission (Current) from 09/26/2022 in [facility] 1C MEDICAL TELEMETRY  30 Day Unplanned Readmission Risk Score (%) 11.2 Filed at 10/08/2022 0801       This score is the patient's risk of an unplanned readmission within 30 days of being discharged (0 -100%). The score is based on dignosis, age, lab data, medications, orders, and past utilization.   Low:  0-14.9   Medium: 15-21.9   High: 22-29.9   Extreme: 30 and above         Discharge Instructions:  Allergies as of 10/08/2022       Reactions   Shellfish Allergy Swelling   Fenofibrate    Meloxicam    Tramadol         Medication List     STOP taking these medications    amoxicillin-clavulanate 500-125 MG tablet Commonly known as: Augmentin   busPIRone 10 MG tablet Commonly known as: BUSPAR   DULoxetine 30 MG capsule Commonly known as: CYMBALTA   Farxiga 10 MG Tabs tablet Generic drug: dapagliflozin propanediol   methocarbamol 500 MG tablet Commonly known as: ROBAXIN   metoprolol succinate 25 MG 24 hr tablet Commonly known as: TOPROL-XL   triamcinolone 0.025 % cream Commonly known as: KENALOG   Vascepa 1 g capsule Generic drug: icosapent Ethyl  TAKE these medications    albuterol 108 (90 Base) MCG/ACT inhaler Commonly known as: VENTOLIN HFA Inhale 2 puffs into the lungs every 6 (six) hours as needed for wheezing or shortness of breath.   amLODipine 5 MG tablet Commonly known as: NORVASC Take 5 mg by mouth daily.   aspirin 81 MG tablet Take 81 mg by mouth daily.   CENTRUM SILVER 50+WOMEN PO Take by mouth daily.   cholecalciferol 1000 units tablet Commonly known as: VITAMIN D Take 1,000 Units by mouth daily.    clopidogrel 75 MG tablet Commonly known as: PLAVIX Take 75 mg by mouth daily.   feeding supplement Liqd Take 237 mLs by mouth 2 (two) times daily between meals.   hydrOXYzine 10 MG tablet Commonly known as: ATARAX TAKE 1 TABLET BY MOUTH TWICE A DAY   nitroGLYCERIN 0.4 MG SL tablet Commonly known as: NITROSTAT Place 0.4 mg under the tongue every 5 (five) minutes as needed.   ondansetron 4 MG tablet Commonly known as: Zofran Take 1 tablet (4 mg total) by mouth every 8 (eight) hours as needed for nausea or vomiting.   OVER THE COUNTER MEDICATION at bedtime. CBD   OVER THE COUNTER MEDICATION daily. BEET ROOT   PREBIOTIC PRODUCT PO Take by mouth daily.   pregabalin 75 MG capsule Commonly known as: LYRICA Take 1 capsule (75 mg total) by mouth 2 (two) times daily.   PROBIOTIC PO Take by mouth daily.   rosuvastatin 40 MG tablet Commonly known as: CRESTOR Take 40 mg by mouth daily.         Follow-up Information     Margaretann Loveless, MD Follow up.   Specialty: Internal Medicine Contact information: [address] [place] [address] [phone]                 Allergies  Allergen Reactions   Shellfish Allergy Swelling   Fenofibrate    Meloxicam    Tramadol      The results of significant diagnostics from this hospitalization (including imaging, microbiology, ancillary and laboratory) are listed below for reference.   Consultations:   Procedures/Studies: DG Wrist 2 Views Right  Result Date: 09/28/2022 CLINICAL DATA:  History of prior wrist surgery with pain following fall, initial encounter EXAM: RIGHT WRIST - 2 VIEW COMPARISON:  None Available. FINDINGS: The proximal carpal row has been surgically removed with the exception of the pisiform. Distal radius and ulna appear within normal limits. Degenerative changes at the first [facility] joint are noted. No other focal abnormality is noted. IMPRESSION: Postsurgical changes with removal of the majority of  the proximal carpal row. No acute abnormality noted. Electronically Signed   By: Alcide Clever M.D.   On: 09/28/2022 15:06   DG Forearm Right  Result Date: 09/28/2022 CLINICAL DATA:  Recent fall with right arm pain, initial encounter EXAM: RIGHT FOREARM - 2 VIEW COMPARISON:  None FINDINGS: No acute fracture or dislocation in the radius or ulna is seen. Postsurgical changes are noted in the wrist with removal of the first carpal row. This is better visualized on recent wrist films. No soft tissue abnormality is noted. IMPRESSION: No acute abnormality noted. Electronically Signed   By: Alcide Clever M.D.   On: 09/28/2022 15:05   DG Humerus Right  Result Date: 09/28/2022 CLINICAL DATA:  Right upper arm pain, no known injury, initial encounter EXAM: RIGHT HUMERUS - 2+ VIEW COMPARISON:  None Available. FINDINGS: Degenerative changes are noted in the acromioclavicular and glenohumeral joints. No  acute fracture or dislocation is seen. Humeral head is high-riding likely related to chronic rotator cuff injury. No soft tissue abnormality is noted. IMPRESSION: Chronic changes without acute abnormality. Electronically Signed   By: Alcide Clever M.D.   On: 09/28/2022 15:03   DG Humerus Left  Result Date: 09/26/2022 CLINICAL DATA:  Fall and trauma to the left upper extremity. EXAM: LEFT HUMERUS - 2+ VIEW; LEFT WRIST - COMPLETE 3+ VIEW; LEFT FOREARM - 2 VIEW COMPARISON:  None Available. FINDINGS: There is no acute fracture or dislocation. The bones are osteopenic. There is degenerative changes of the left AC joint as well as arthritic changes of the base of the thumb. No joint effusion. The soft tissues are unremarkable. IMPRESSION: 1. No acute fracture or dislocation. 2. Osteopenia and degenerative changes. Electronically Signed   By: Elgie Collard M.D.   On: 09/26/2022 22:31   DG Forearm Left  Result Date: 09/26/2022 CLINICAL DATA:  Fall and trauma to the left upper extremity. EXAM: LEFT HUMERUS - 2+ VIEW; LEFT  WRIST - COMPLETE 3+ VIEW; LEFT FOREARM - 2 VIEW COMPARISON:  None Available. FINDINGS: There is no acute fracture or dislocation. The bones are osteopenic. There is degenerative changes of the left AC joint as well as arthritic changes of the base of the thumb. No joint effusion. The soft tissues are unremarkable. IMPRESSION: 1. No acute fracture or dislocation. 2. Osteopenia and degenerative changes. Electronically Signed   By: Elgie Collard M.D.   On: 09/26/2022 22:31   DG Wrist Complete Left  Result Date: 09/26/2022 CLINICAL DATA:  Fall and trauma to the left upper extremity. EXAM: LEFT HUMERUS - 2+ VIEW; LEFT WRIST - COMPLETE 3+ VIEW; LEFT FOREARM - 2 VIEW COMPARISON:  None Available. FINDINGS: There is no acute fracture or dislocation. The bones are osteopenic. There is degenerative changes of the left AC joint as well as arthritic changes of the base of the thumb. No joint effusion. The soft tissues are unremarkable. IMPRESSION: 1. No acute fracture or dislocation. 2. Osteopenia and degenerative changes. Electronically Signed   By: Elgie Collard M.D.   On: 09/26/2022 22:31   DG Chest Port 1 View  Result Date: 09/26/2022 CLINICAL DATA:  Questionable sepsis EXAM: PORTABLE CHEST 1 VIEW COMPARISON:  Chest x-ray 06/22/2022 FINDINGS: The heart size and mediastinal contours are within normal limits. Both lungs are clear. The visualized skeletal structures are unremarkable. IMPRESSION: No active disease. Electronically Signed   By: Darliss Cheney M.D.   On: 09/26/2022 22:28   CT Maxillofacial Wo Contrast  Result Date: 09/26/2022 CLINICAL DATA:  Facial trauma, blunt.  Found down. EXAM: CT MAXILLOFACIAL WITHOUT CONTRAST TECHNIQUE: Multidetector CT imaging of the maxillofacial structures was performed. Multiplanar CT image reconstructions were also generated. RADIATION DOSE REDUCTION: This exam was performed according to the departmental dose-optimization program which includes automated exposure  control, adjustment of the mA and/or kV according to patient size and/or use of iterative reconstruction technique. COMPARISON:  None Available. FINDINGS: Osseous: No fracture or mandibular dislocation. No destructive process. Orbits: Negative. No traumatic or inflammatory finding. Sinuses: Clear Soft tissues: Soft tissue swelling in the right face. Limited intracranial: See head CT report IMPRESSION: No facial or orbital fracture. Electronically Signed   By: Charlett Nose M.D.   On: 09/26/2022 22:07   CT Cervical Spine Wo Contrast  Result Date: 09/26/2022 CLINICAL DATA:  Neck trauma (Age >= 65y).  Found down. EXAM: CT CERVICAL SPINE WITHOUT CONTRAST TECHNIQUE: Multidetector CT imaging of the cervical  spine was performed without intravenous contrast. Multiplanar CT image reconstructions were also generated. RADIATION DOSE REDUCTION: This exam was performed according to the departmental dose-optimization program which includes automated exposure control, adjustment of the mA and/or kV according to patient size and/or use of iterative reconstruction technique. COMPARISON:  None Available. FINDINGS: Alignment: Normal Skull base and vertebrae: No acute fracture. No primary bone lesion or focal pathologic process. Soft tissues and spinal canal: No prevertebral fluid or swelling. No visible canal hematoma. Disc levels: Degenerative disc disease with disc space narrowing and spurring most pronounced in the lower cervical spine. Moderate bilateral degenerative facet disease, left greater than right. Upper chest: No acute findings Other: None IMPRESSION: Degenerative disc and facet disease. No acute bony abnormality. Electronically Signed   By: Charlett Nose M.D.   On: 09/26/2022 22:04   CT Head Wo Contrast  Result Date: 09/26/2022 CLINICAL DATA:  Head trauma, minor (Age >= 65y).  Found down EXAM: CT HEAD WITHOUT CONTRAST TECHNIQUE: Contiguous axial images were obtained from the base of the skull through the vertex  without intravenous contrast. RADIATION DOSE REDUCTION: This exam was performed according to the departmental dose-optimization program which includes automated exposure control, adjustment of the mA and/or kV according to patient size and/or use of iterative reconstruction technique. COMPARISON:  None Available. FINDINGS: Brain: There is atrophy and chronic small vessel disease changes. No acute intracranial abnormality. Specifically, no hemorrhage, hydrocephalus, mass lesion, acute infarction, or significant intracranial injury. Vascular: No hyperdense vessel or unexpected calcification. Skull: No acute calvarial abnormality. Sinuses/Orbits: No acute findings Other: None IMPRESSION: Atrophy, chronic microvascular disease. No acute intracranial abnormality. Electronically Signed   By: Charlett Nose M.D.   On: 09/26/2022 22:03      Labs: BNP (last 3 results) No results for input(s): "BNP" in the last 8760 hours. Basic Metabolic Panel: Recent Labs  Lab 10/03/22 1104 10/04/22 0603  NA 139 139  K 4.1 4.5  CL 108 107  CO2 26 24  GLUCOSE 102* 95  BUN 26* 30*  CREATININE 1.02* 1.10*  CALCIUM 8.5* 8.4*  MG 1.6* 1.8   Liver Function Tests: No results for input(s): "AST", "ALT", "ALKPHOS", "BILITOT", "PROT", "ALBUMIN" in the last 168 hours. No results for input(s): "LIPASE", "AMYLASE" in the last 168 hours. No results for input(s): "AMMONIA" in the last 168 hours. CBC: Recent Labs  Lab 10/03/22 1104 10/04/22 0603  WBC 5.9 5.3  HGB 10.9* 10.1*  HCT 32.7* 30.8*  MCV 91.6 92.5  PLT 223 215   Cardiac Enzymes: No results for input(s): "CKTOTAL", "CKMB", "CKMBINDEX", "TROPONINI" in the last 168 hours. BNP: Invalid input(s): "POCBNP" CBG: No results for input(s): "GLUCAP" in the last 168 hours. D-Dimer No results for input(s): "DDIMER" in the last 72 hours. Hgb A1c No results for input(s): "HGBA1C" in the last 72 hours. Lipid Profile No results for input(s): "CHOL", "HDL", "LDLCALC",  "TRIG", "CHOLHDL", "LDLDIRECT" in the last 72 hours. Thyroid function studies No results for input(s): "TSH", "T4TOTAL", "T3FREE", "THYROIDAB" in the last 72 hours.  Invalid input(s): "FREET3" Anemia work up No results for input(s): "VITAMINB12", "FOLATE", "FERRITIN", "TIBC", "IRON", "RETICCTPCT" in the last 72 hours. Urinalysis    Component Value Date/Time   COLORURINE YELLOW (A) 10/31/2019 1335   APPEARANCEUR CLEAR (A) 10/31/2019 1335   LABSPEC 1.013 10/31/2019 1335   PHURINE 7.0 10/31/2019 1335   GLUCOSEU NEGATIVE 10/31/2019 1335   HGBUR NEGATIVE 10/31/2019 1335   BILIRUBINUR NEGATIVE 10/31/2019 1335   KETONESUR NEGATIVE 10/31/2019 1335  PROTEINUR NEGATIVE 10/31/2019 1335   NITRITE NEGATIVE 10/31/2019 1335   LEUKOCYTESUR NEGATIVE 10/31/2019 1335   Sepsis Labs Recent Labs  Lab 10/03/22 1104 10/04/22 0603  WBC 5.9 5.3   Microbiology No results found for this or any previous visit (from the past 240 hour(s)).   Total time spend on discharging this patient, including the last patient exam, discussing the hospital stay, instructions for ongoing care as it relates to all pertinent caregivers, as well as preparing the medical discharge records, prescriptions, and/or referrals as applicable, is 30 minutes.    Darlin Priestly, MD  [facility] 10/08/2022, 9:07 AM

## 2022-10-08 NOTE — Progress Notes (Signed)
Patient being discharged to [facility]. PIV removed. Called report to nurse, Johnny Bridge at [place]. All discharge paper work, DNR and PNA vaccine information are in discharge packet given to EMS. Patient will be transported via EMS.

## 2022-10-09 DIAGNOSIS — T796XXA Traumatic ischemia of muscle, initial encounter: Secondary | ICD-10-CM | POA: Diagnosis not present

## 2022-10-09 DIAGNOSIS — E1142 Type 2 diabetes mellitus with diabetic polyneuropathy: Secondary | ICD-10-CM | POA: Diagnosis not present

## 2022-10-09 DIAGNOSIS — R5381 Other malaise: Secondary | ICD-10-CM | POA: Diagnosis not present

## 2022-10-09 DIAGNOSIS — I25119 Atherosclerotic heart disease of native coronary artery with unspecified angina pectoris: Secondary | ICD-10-CM | POA: Diagnosis not present

## 2022-10-09 DIAGNOSIS — W19XXXA Unspecified fall, initial encounter: Secondary | ICD-10-CM | POA: Diagnosis not present

## 2022-10-09 DIAGNOSIS — I252 Old myocardial infarction: Secondary | ICD-10-CM | POA: Diagnosis not present

## 2022-10-09 DIAGNOSIS — I5032 Chronic diastolic (congestive) heart failure: Secondary | ICD-10-CM | POA: Diagnosis not present

## 2022-10-09 DIAGNOSIS — R110 Nausea: Secondary | ICD-10-CM | POA: Diagnosis not present

## 2022-10-09 DIAGNOSIS — F419 Anxiety disorder, unspecified: Secondary | ICD-10-CM | POA: Diagnosis not present

## 2022-10-09 DIAGNOSIS — E785 Hyperlipidemia, unspecified: Secondary | ICD-10-CM | POA: Diagnosis not present

## 2022-10-09 DIAGNOSIS — J4520 Mild intermittent asthma, uncomplicated: Secondary | ICD-10-CM | POA: Diagnosis not present

## 2022-10-09 DIAGNOSIS — E1140 Type 2 diabetes mellitus with diabetic neuropathy, unspecified: Secondary | ICD-10-CM | POA: Diagnosis not present

## 2022-10-09 DIAGNOSIS — M48061 Spinal stenosis, lumbar region without neurogenic claudication: Secondary | ICD-10-CM | POA: Diagnosis not present

## 2022-10-09 DIAGNOSIS — I110 Hypertensive heart disease with heart failure: Secondary | ICD-10-CM | POA: Diagnosis not present

## 2022-10-17 ENCOUNTER — Ambulatory Visit: Payer: Medicare HMO | Admitting: Pain Medicine

## 2022-10-18 ENCOUNTER — Telehealth: Payer: Self-pay | Admitting: Internal Medicine

## 2022-10-18 DIAGNOSIS — E785 Hyperlipidemia, unspecified: Secondary | ICD-10-CM | POA: Diagnosis not present

## 2022-10-18 DIAGNOSIS — I110 Hypertensive heart disease with heart failure: Secondary | ICD-10-CM | POA: Diagnosis not present

## 2022-10-18 DIAGNOSIS — I5032 Chronic diastolic (congestive) heart failure: Secondary | ICD-10-CM | POA: Diagnosis not present

## 2022-10-18 DIAGNOSIS — Z7982 Long term (current) use of aspirin: Secondary | ICD-10-CM | POA: Diagnosis not present

## 2022-10-18 DIAGNOSIS — T796XXD Traumatic ischemia of muscle, subsequent encounter: Secondary | ICD-10-CM | POA: Diagnosis not present

## 2022-10-18 DIAGNOSIS — R5381 Other malaise: Secondary | ICD-10-CM | POA: Diagnosis not present

## 2022-10-18 DIAGNOSIS — J4520 Mild intermittent asthma, uncomplicated: Secondary | ICD-10-CM | POA: Diagnosis not present

## 2022-10-18 DIAGNOSIS — F419 Anxiety disorder, unspecified: Secondary | ICD-10-CM | POA: Diagnosis not present

## 2022-10-18 DIAGNOSIS — M48061 Spinal stenosis, lumbar region without neurogenic claudication: Secondary | ICD-10-CM | POA: Diagnosis not present

## 2022-10-18 DIAGNOSIS — Z7902 Long term (current) use of antithrombotics/antiplatelets: Secondary | ICD-10-CM | POA: Diagnosis not present

## 2022-10-18 DIAGNOSIS — I25119 Atherosclerotic heart disease of native coronary artery with unspecified angina pectoris: Secondary | ICD-10-CM | POA: Diagnosis not present

## 2022-10-18 DIAGNOSIS — E1142 Type 2 diabetes mellitus with diabetic polyneuropathy: Secondary | ICD-10-CM | POA: Diagnosis not present

## 2022-10-18 DIAGNOSIS — E1140 Type 2 diabetes mellitus with diabetic neuropathy, unspecified: Secondary | ICD-10-CM | POA: Diagnosis not present

## 2022-10-18 NOTE — Telephone Encounter (Signed)
Patient left VM that she had a fall and would like orders for a home health aide to help her around her house and getting around. Please advise.

## 2022-10-22 ENCOUNTER — Telehealth: Payer: Self-pay | Admitting: Internal Medicine

## 2022-10-22 DIAGNOSIS — Z79899 Other long term (current) drug therapy: Secondary | ICD-10-CM | POA: Insufficient documentation

## 2022-10-22 DIAGNOSIS — M4804 Spinal stenosis, thoracic region: Secondary | ICD-10-CM | POA: Insufficient documentation

## 2022-10-22 DIAGNOSIS — R936 Abnormal findings on diagnostic imaging of limbs: Secondary | ICD-10-CM | POA: Insufficient documentation

## 2022-10-22 DIAGNOSIS — M47814 Spondylosis without myelopathy or radiculopathy, thoracic region: Secondary | ICD-10-CM | POA: Insufficient documentation

## 2022-10-22 DIAGNOSIS — M899 Disorder of bone, unspecified: Secondary | ICD-10-CM | POA: Insufficient documentation

## 2022-10-22 DIAGNOSIS — M8580 Other specified disorders of bone density and structure, unspecified site: Secondary | ICD-10-CM | POA: Insufficient documentation

## 2022-10-22 DIAGNOSIS — N189 Chronic kidney disease, unspecified: Secondary | ICD-10-CM | POA: Insufficient documentation

## 2022-10-22 DIAGNOSIS — M5030 Other cervical disc degeneration, unspecified cervical region: Secondary | ICD-10-CM | POA: Insufficient documentation

## 2022-10-22 DIAGNOSIS — M5134 Other intervertebral disc degeneration, thoracic region: Secondary | ICD-10-CM | POA: Insufficient documentation

## 2022-10-22 DIAGNOSIS — Z7901 Long term (current) use of anticoagulants: Secondary | ICD-10-CM | POA: Insufficient documentation

## 2022-10-22 DIAGNOSIS — M47812 Spondylosis without myelopathy or radiculopathy, cervical region: Secondary | ICD-10-CM | POA: Insufficient documentation

## 2022-10-22 DIAGNOSIS — Z789 Other specified health status: Secondary | ICD-10-CM | POA: Insufficient documentation

## 2022-10-22 DIAGNOSIS — G894 Chronic pain syndrome: Secondary | ICD-10-CM | POA: Insufficient documentation

## 2022-10-22 DIAGNOSIS — E1142 Type 2 diabetes mellitus with diabetic polyneuropathy: Secondary | ICD-10-CM | POA: Insufficient documentation

## 2022-10-22 DIAGNOSIS — G8929 Other chronic pain: Secondary | ICD-10-CM | POA: Insufficient documentation

## 2022-10-22 NOTE — Telephone Encounter (Signed)
Patient called in telling that she had been in the hospital for 4 weeks after being found unconscious in her home. She states that she fell on her wrist and broke it. She is telling me about her needing home health. Advised her that we have already received a call from [facility] this morning about them coming out to her house tomorrow for PT. She is wanting a personal aide to help her with getting around her home.   She would like to use [facility] for personal aide. Does [facility] offer personal aide services?

## 2022-10-22 NOTE — Progress Notes (Deleted)
Patient: Gail Paul  Service Category: E/M  Provider: Oswaldo Done, MD  DOB: [birth year]  DOS: 10/24/2022  Referring Provider: Elijah Birk, MD  MRN: [id number]  Setting: Ambulatory outpatient  PCP: Margaretann Loveless, MD  Type: New Patient  Specialty: Interventional Pain Management    Location: Office  Delivery: Face-to-face     Primary Reason(s) for Visit: Encounter for initial evaluation of one or more chronic problems (new to examiner) potentially causing chronic pain, and posing a threat to normal musculoskeletal function. (Level of risk: High) CC: No chief complaint on file.  HPI  Gail Paul is a 89 y.o. year old, female patient, who comes for the first time to our practice referred by Filomena Jungling I, MD for our initial evaluation of her chronic pain. She has Diabetes (HCC); Essential hypertension, benign; CAD (coronary artery disease); Full thickness rotator cuff tear; Radial styloid tenosynovitis; Shoulder pain; Sprain of rotator cuff capsule; Stiffness of shoulder joint; PAD (peripheral artery disease) (HCC); Acute encephalopathy; Obesity (BMI 30-39.9); Adverse drug interaction with prescription medication; Arthritis of carpometacarpal (CMC) joint of left thumb; Bilateral hand pain; Status post proximal row carpectomy of wrist; GAD (generalized anxiety disorder); Hyperlipidemia; Thoracic spondylosis with radiculopathy; Mild intermittent asthma without complication; 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); Mitral valve disorder; Congestive heart failure (HCC); Spinal stenosis of lumbar region; Acute cough; Acute nasopharyngitis; Nausea; Rhabdomyolysis; Elevated troponin; Essential hypertension; Anxiety and depression; Dyslipidemia; Type 2 diabetes mellitus with peripheral neuropathy (HCC); Acute kidney injury superimposed on chronic kidney disease (HCC); Altered mental status; Chronic pain syndrome; Pharmacologic therapy; Disorder of skeletal system; Problems influencing health  status; Diabetic peripheral neuropathy (HCC); Chronic kidney disease; Chronic anticoagulation (Plavix); DDD (degenerative disc disease), cervical; Cervical facet joint arthropathy (Multilevel) (Bilateral) (L>R); Chronic shoulder pain (Bilateral); Abnormal MRI, shoulder (10/01/2014) (Right); DDD (degenerative disc disease), thoracic; Thoracic foraminal stenosis (Bilateral: T9-10 through T12-L1) (Severe: T12-L1); Thoracic central spinal stenosis (T10-11, T12-L1); Thoracic facet arthropathy (Multilevel) (Bilateral); and Osteopenia determined by x-ray on their problem list. Today she comes in for evaluation of her No chief complaint on file.  Pain Assessment: Location:     Radiating:   Onset:   Duration:   Quality:   Severity:  /10 (subjective, self-reported pain score)  Effect on ADL:   Timing:   Modifying factors:   BP:    HR:    Onset and Duration: {Hx; Onset and Duration:210120511} Cause of pain: {Hx; Cause:210120521} Severity: {Pain Severity:210120502} Timing: {Symptoms; Timing:210120501} Aggravating Factors: {Causes; Aggravating pain factors:210120507} Alleviating Factors: {Causes; Alleviating Factors:210120500} Associated Problems: {Hx; Associated problems:210120515} Quality of Pain: {Hx; Symptom quality or Descriptor:210120531} Previous Examinations or Tests: {Hx; Previous examinations or test:210120529} Previous Treatments: {Hx; Previous Treatment:210120503}  Gail Paul is being evaluated for possible interventional pain management therapies for the treatment of her chronic pain.   ***  Gail Paul has been informed that this initial visit was an evaluation only.  On the follow up appointment I will go over the results, including ordered tests and available interventional therapies. At that time she will have the opportunity to decide whether to proceed with offered therapies or not. In the event that Gail Paul prefers avoiding interventional options, this will conclude our  involvement in the case.  Medication management recommendations may be provided upon request.  Historic Controlled Substance Pharmacotherapy Review  PMP and historical list of controlled substances: Pregabalin 75 mg capsule (60/month) (last filled on 10/09/2022) Most recently prescribed opioid analgesics:   None MME/day: 0 mg/day  Historical Monitoring: The patient  reports no history of drug use. List of prior UDS Testing: Lab Results  Component Value Date   MDMA NONE DETECTED 10/31/2019   COCAINSCRNUR NONE DETECTED 10/31/2019   PCPSCRNUR NONE DETECTED 10/31/2019   THCU NONE DETECTED 10/31/2019   Historical Background Evaluation: [place] PMP: PDMP reviewed during this encounter. Review of the past 35-months conducted.             PMP NARX Score Report:  Narcotic: 120 Sedative: 251 Stimulant: 000 [place] Department of public safety, offender search: Engineer, mining Information) Non-contributory Risk Assessment Profile: Aberrant behavior: None observed or detected today Risk factors for fatal opioid overdose: None identified today PMP NARX Overdose Risk Score: 050 Fatal overdose hazard ratio (HR): Calculation deferred Non-fatal overdose hazard ratio (HR): Calculation deferred Risk of opioid abuse or dependence: 0.7-3.0% with doses ? 36 MME/day and 6.1-26% with doses ? 120 MME/day. Substance use disorder (SUD) risk level: See below Personal History of Substance Abuse (SUD-Substance use disorder):  Alcohol:    Illegal Drugs:    Rx Drugs:    ORT Risk Level calculation:    ORT Scoring interpretation table:  Score <3 = Low Risk for SUD  Score between 4-7 = Moderate Risk for SUD  Score >8 = High Risk for Opioid Abuse   PHQ-2 Depression Scale:  Total score:    PHQ-2 Scoring interpretation table: (Score and probability of major depressive disorder)  Score 0 = No depression  Score 1 = 15.4% Probability  Score 2 = 21.1% Probability  Score 3 = 38.4% Probability  Score 4 = 45.5% Probability  Score 5  = 56.4% Probability  Score 6 = 78.6% Probability   PHQ-9 Depression Scale:  Total score:    PHQ-9 Scoring interpretation table:  Score 0-4 = No depression  Score 5-9 = Mild depression  Score 10-14 = Moderate depression  Score 15-19 = Moderately severe depression  Score 20-27 = Severe depression (2.4 times higher risk of SUD and 2.89 times higher risk of overuse)   Pharmacologic Plan: As per protocol, I have not taken over any controlled substance management, pending the results of ordered tests and/or consults.            Initial impression: Pending review of available data and ordered tests.  Meds   Current Outpatient Medications:    albuterol (VENTOLIN HFA) 108 (90 Base) MCG/ACT inhaler, Inhale 2 puffs into the lungs every 6 (six) hours as needed for wheezing or shortness of breath., Disp: 8 g, Rfl: 2   amLODipine (NORVASC) 5 MG tablet, Take 5 mg by mouth daily., Disp: , Rfl:    aspirin 81 MG tablet, Take 81 mg by mouth daily., Disp: , Rfl:    cholecalciferol (VITAMIN D) 1000 UNITS tablet, Take 1,000 Units by mouth daily., Disp: , Rfl:    clopidogrel (PLAVIX) 75 MG tablet, Take 75 mg by mouth daily., Disp: , Rfl:    feeding supplement (ENSURE ENLIVE / ENSURE PLUS) LIQD, Take 237 mLs by mouth 2 (two) times daily between meals., Disp: , Rfl:    hydrOXYzine (ATARAX) 10 MG tablet, TAKE 1 TABLET BY MOUTH TWICE A DAY, Disp: 180 tablet, Rfl: 1   Multiple Vitamins-Minerals (CENTRUM SILVER 50+WOMEN PO), Take by mouth daily., Disp: , Rfl:    nitroGLYCERIN (NITROSTAT) 0.4 MG SL tablet, Place 0.4 mg under the tongue every 5 (five) minutes as needed., Disp: , Rfl:    ondansetron (ZOFRAN) 4 MG tablet, Take 1 tablet (4 mg total) by mouth every 8 (eight) hours  as needed for nausea or vomiting., Disp: 20 tablet, Rfl: 0   OVER THE COUNTER MEDICATION, at bedtime. CBD, Disp: , Rfl:    OVER THE COUNTER MEDICATION, daily. BEET ROOT, Disp: , Rfl:    PREBIOTIC PRODUCT PO, Take by mouth daily., Disp: , Rfl:     pregabalin (LYRICA) 75 MG capsule, Take 1 capsule (75 mg total) by mouth 2 (two) times daily., Disp: 60 capsule, Rfl: 0   Probiotic Product (PROBIOTIC PO), Take by mouth daily., Disp: , Rfl:    rosuvastatin (CRESTOR) 40 MG tablet, Take 40 mg by mouth daily., Disp: , Rfl:   Imaging Review  Cervical Imaging: Cervical CT wo contrast: Results for orders placed during the hospital encounter of 09/26/22 CT Cervical Spine Wo Contrast  Narrative CLINICAL DATA:  Neck trauma (Age >= 65y).  Found down.  EXAM: CT CERVICAL SPINE WITHOUT CONTRAST  TECHNIQUE: Multidetector CT imaging of the cervical spine was performed without intravenous contrast. Multiplanar CT image reconstructions were also generated.  RADIATION DOSE REDUCTION: This exam was performed according to the departmental dose-optimization program which includes automated exposure control, adjustment of the mA and/or kV according to patient size and/or use of iterative reconstruction technique.  COMPARISON:  None Available.  FINDINGS: Alignment: Normal  Skull base and vertebrae: No acute fracture. No primary bone lesion or focal pathologic process.  Soft tissues and spinal canal: No prevertebral fluid or swelling. No visible canal hematoma.  Disc levels: Degenerative disc disease with disc space narrowing and spurring most pronounced in the lower cervical spine. Moderate bilateral degenerative facet disease, left greater than right.  Upper chest: No acute findings  Other: None  IMPRESSION: Degenerative disc and facet disease.  No acute bony abnormality.   Electronically Signed By: Charlett Nose M.D. On: 09/26/2022 22:04  Shoulder Imaging: Shoulder-R MR wo contrast: Results for orders placed during the hospital encounter of 10/01/14 MR Shoulder Right Wo Contrast  Narrative CLINICAL DATA:  Chronic bilateral shoulder pain. Status post fall 2 months ago with a right shoulder injury. Initial  encounter.  EXAM: MRI OF THE RIGHT SHOULDER WITHOUT CONTRAST  TECHNIQUE: Multiplanar, multisequence MR imaging of the shoulder was performed. No intravenous contrast was administered.  COMPARISON:  None.  FINDINGS: With  Rotator cuff: The supraspinatus and infraspinatus tendons are completely torn and retracted to the level of the glenoid, 4-5 cm. The subscapularis is also completely torn with 1-2 cm of retraction.  Muscles: There is fatty atrophy of the rotator cuff musculature. The subscapularis appears most preserved.  Biceps long head: The tendon is completely torn from the superior labrum no retracted approximately 2 cm below the top of the humeral head.  Acromioclavicular Joint:  Bulky degenerative change is seen.  Glenohumeral Joint: The humeral head is markedly high-riding secondary to the patient's rotator cuff tears.  Labrum: The superior labrum is not visualized consistent with degenerative maceration.  Bones: The acromion is type 2. There is fluid in the subacromial/subdeltoid bursa.  IMPRESSION: Complete supraspinatus, infraspinatus and subscapularis tendon tears. There is fatty atrophy of all muscle bellies although the subscapularis appears most preserved. The supraspinatus and infraspinatus tendons are retracted 4-5 cm. The subscapularis is retracted 1-2 cm. The humeral head is markedly high-riding secondary to the patient's rotator cuff tears.  Complete tear of the long head of biceps from the superior labrum.  Nonvisualization of the superior labrum consistent with degenerative maceration.  Bulky acromioclavicular osteoarthritis.   Electronically Signed By: Drusilla Kanner M.D. On: 10/01/2014 13:06  Thoracic Imaging: Thoracic MR wo contrast: Results for orders placed during the hospital encounter of 05/19/22 MR THORACIC SPINE WO CONTRAST  Narrative CLINICAL DATA:  Chronic mid and lower back pain. Lumbar fusion 20 years  ago.  EXAM: MRI THORACIC SPINE WITHOUT CONTRAST  TECHNIQUE: Multiplanar, multisequence MR imaging of the thoracic spine was performed. No intravenous contrast was administered.  COMPARISON:  MRI thoracic spine 05/15/2021.  FINDINGS: Alignment:  Normal.  Vertebrae: Modic type 2 degenerative endplate marrow signal changes at T10-11 and T11-12.  Cord:  Normal spinal cord signal and volume.  Paraspinal and other soft tissues: Unchanged 2.5 cm right adrenal adenoma. No acute findings.  Disc levels:  Unchanged scattered small disc bulges and lower thoracic facet arthropathy. Mild spinal canal stenosis at T10-11. Moderate spinal canal stenosis at T12-L1. Moderate bilateral neural foraminal narrowing from T9-10 through T11-12. Severe bilateral neural foraminal narrowing at T12-L1.  IMPRESSION: 1. Unchanged multilevel thoracic degenerative disc disease and lower thoracic facet arthropathy worst at T12-L1, where there is moderate spinal canal stenosis and severe bilateral neural foraminal narrowing. 2. Mild spinal canal stenosis at T10-11. 3. Moderate bilateral neural foraminal narrowing from T9-10 through T11-12.   Electronically Signed By: Orvan Falconer M.D. On: 05/23/2022 12:56  Wrist Imaging: Wrist-L DG Complete: Results for orders placed during the hospital encounter of 09/26/22 DG Wrist Complete Left  Narrative CLINICAL DATA:  Fall and trauma to the left upper extremity.  EXAM: LEFT HUMERUS - 2+ VIEW; LEFT WRIST - COMPLETE 3+ VIEW; LEFT FOREARM - 2 VIEW  COMPARISON:  None Available.  FINDINGS: There is no acute fracture or dislocation. The bones are osteopenic. There is degenerative changes of the left AC joint as well as arthritic changes of the base of the thumb. No joint effusion. The soft tissues are unremarkable.  IMPRESSION: 1. No acute fracture or dislocation. 2. Osteopenia and degenerative changes.   Electronically Signed By: Elgie Collard  M.D. On: 09/26/2022 22:31  Complexity Note: Imaging results reviewed.                         ROS  Cardiovascular: {Hx; Cardiovascular History:210120525} Pulmonary or Respiratory: {Hx; Pumonary and/or Respiratory History:210120523} Neurological: {Hx; Neurological:210120504} Psychological-Psychiatric: {Hx; Psychological-Psychiatric History:210120512} Gastrointestinal: {Hx; Gastrointestinal:210120527} Genitourinary: {Hx; Genitourinary:210120506} Hematological: {Hx; Hematological:210120510} Endocrine: {Hx; Endocrine history:210120509} Rheumatologic: {Hx; Rheumatological:210120530} Musculoskeletal: {Hx; Musculoskeletal:210120528} Work History: {Hx; Work history:210120514}  Allergies  Gail Paul is allergic to shellfish allergy, fenofibrate, meloxicam, and tramadol.  Laboratory Chemistry Profile   Renal Lab Results  Component Value Date   BUN 30 (H) 10/04/2022   CREATININE 1.10 (H) 10/04/2022   BCR 20 05/18/2022   GFRAA 44 (L) 10/31/2019   GFRNONAA 49 (L) 10/04/2022   PROTEINUR NEGATIVE 10/31/2019     Electrolytes Lab Results  Component Value Date   NA 139 10/04/2022   K 4.5 10/04/2022   CL 107 10/04/2022   CALCIUM 8.4 (L) 10/04/2022   MG 1.8 10/04/2022     Hepatic Lab Results  Component Value Date   AST 71 (H) 09/26/2022   ALT 32 09/26/2022   ALBUMIN 4.1 09/26/2022   ALKPHOS 59 09/26/2022   AMMONIA 11 10/31/2019     ID Lab Results  Component Value Date   SARSCOV2NAA neg 06/22/2022     Bone No results found for: "VD25OH", "VD125OH2TOT", "PX1062IR4", "WN4627OJ5", "25OHVITD1", "25OHVITD2", "25OHVITD3", "TESTOFREE", "TESTOSTERONE"   Endocrine Lab Results  Component Value Date   GLUCOSE 95 10/04/2022   GLUCOSEU NEGATIVE 10/31/2019  HGBA1C 6.5 (H) 05/18/2022     Neuropathy Lab Results  Component Value Date   HGBA1C 6.5 (H) 05/18/2022     CNS No results found for: "COLORCSF", "APPEARCSF", "RBCCOUNTCSF", "WBCCSF", "POLYSCSF", "LYMPHSCSF", "EOSCSF",  "PROTEINCSF", "GLUCCSF", "JCVIRUS", "CSFOLI", "IGGCSF", "LABACHR", "ACETBL"   Inflammation (CRP: Acute  ESR: Chronic) Lab Results  Component Value Date   LATICACIDVEN 2.1 (HH) 09/26/2022     Rheumatology No results found for: "RF", "Daron", "LABURIC", "URICUR", "LYMEIGGIGMAB", "LYMEABIGMQN", "HLAB27"   Coagulation Lab Results  Component Value Date   INR 1.3 (H) 09/26/2022   LABPROT 15.9 (H) 09/26/2022   APTT 27 09/26/2022   PLT 215 10/04/2022     Cardiovascular Lab Results  Component Value Date   CKTOTAL 371 (H) 09/29/2022   CKMB 30.1 (H) 12/22/2012   TROPONINI 0.03 10/04/2014   HGB 10.1 (L) 10/04/2022   HCT 30.8 (L) 10/04/2022     Screening Lab Results  Component Value Date   SARSCOV2NAA neg 06/22/2022     Cancer No results found for: "CEA", "CA125", "LABCA2"   Allergens No results found for: "ALMOND", "APPLE", "ASPARAGUS", "AVOCADO", "BANANA", "BARLEY", "BASIL", "BAYLEAF", "GREENBEAN", "LIMABEAN", "WHITEBEAN", "BEEFIGE", "REDBEET", "BLUEBERRY", "BROCCOLI", "CABBAGE", "MELON", "CARROT", "CASEIN", "CASHEWNUT", "CAULIFLOWER", "CELERY"     Note: Lab results reviewed.  PFSH  Drug: Gail Paul  reports no history of drug use. Alcohol:  reports no history of alcohol use. Tobacco:  reports that she has never smoked. She has never used smokeless tobacco. Medical:  has a past medical history of Arthritis of carpometacarpal (CMC) joint of left thumb (07/11/2021), CHF (congestive heart failure) (HCC), Heart attack (HCC), Hypertension, and Shortness of breath (12/22/2015). Family: family history includes Deep vein thrombosis in her mother; Emphysema in her father; Healthy in her brother; Hypertension in her father; Stroke in her mother.  Past Surgical History:  Procedure Laterality Date   BACK SURGERY     carpel tunnel surgery     CATARACT EXTRACTION     ROTATOR CUFF REPAIR Left    Active Ambulatory Problems    Diagnosis Date Noted   Diabetes (HCC) 12/16/2018    Essential hypertension, benign 04/03/2016   CAD (coronary artery disease) 12/26/2012   Full thickness rotator cuff tear 12/16/2018   Radial styloid tenosynovitis 12/16/2018   Shoulder pain 12/16/2018   Sprain of rotator cuff capsule 12/16/2018   Stiffness of shoulder joint 12/16/2018   PAD (peripheral artery disease) (HCC) 12/16/2018   Acute encephalopathy 10/31/2019   Obesity (BMI 30-39.9) 11/01/2019   Adverse drug interaction with prescription medication 11/01/2019   Arthritis of carpometacarpal (CMC) joint of left thumb 07/11/2021   Bilateral hand pain 08/22/2021   Status post proximal row carpectomy of wrist 08/22/2021   GAD (generalized anxiety disorder) 05/04/2022   Hyperlipidemia 12/26/2012   Thoracic spondylosis with radiculopathy 05/04/2022   Mild intermittent asthma without complication 05/04/2022   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) 12/26/2012   Mitral valve disorder 03/16/2014   Congestive heart failure (HCC) 01/09/2013   Spinal stenosis of lumbar region 06/16/2022   Acute cough 06/22/2022   Acute nasopharyngitis 06/22/2022   Nausea 07/02/2022   Rhabdomyolysis 09/26/2022   Elevated troponin 09/26/2022   Essential hypertension 09/26/2022   Anxiety and depression 09/26/2022   Dyslipidemia 09/26/2022   Type 2 diabetes mellitus with peripheral neuropathy (HCC) 09/26/2022   Acute kidney injury superimposed on chronic kidney disease (HCC) 09/27/2022   Altered mental status 09/27/2022   Chronic pain syndrome 10/22/2022   Pharmacologic therapy 10/22/2022   Disorder of skeletal system  10/22/2022   Problems influencing health status 10/22/2022   Diabetic peripheral neuropathy (HCC) 10/22/2022   Chronic kidney disease 10/22/2022   Chronic anticoagulation (Plavix) 10/22/2022   DDD (degenerative disc disease), cervical 10/22/2022   Cervical facet joint arthropathy (Multilevel) (Bilateral) (L>R) 10/22/2022   Chronic shoulder pain (Bilateral) 10/22/2022   Abnormal MRI,  shoulder (10/01/2014) (Right) 10/22/2022   DDD (degenerative disc disease), thoracic 10/22/2022   Thoracic foraminal stenosis (Bilateral: T9-10 through T12-L1) (Severe: T12-L1) 10/22/2022   Thoracic central spinal stenosis (T10-11, T12-L1) 10/22/2022   Thoracic facet arthropathy (Multilevel) (Bilateral) 10/22/2022   Osteopenia determined by x-ray 10/22/2022   Resolved Ambulatory Problems    Diagnosis Date Noted   Shortness of breath 12/22/2015   Atypical chest pain 10/28/2015   Past Medical History:  Diagnosis Date   CHF (congestive heart failure) (HCC)    Heart attack (HCC)    Hypertension    Constitutional Exam  General appearance: Well nourished, well developed, and well hydrated. In no apparent acute distress There were no vitals filed for this visit. BMI Assessment: Estimated body mass index is 32.02 kg/m as calculated from the following:   Height as of 10/06/22: 5\' 2"  (1.575 m).   Weight as of 10/03/22: 175 lb 0.7 oz (79.4 kg).  BMI interpretation table: BMI level Category Range association with higher incidence of chronic pain  <18 kg/m2 Underweight   18.5-24.9 kg/m2 Ideal body weight   25-29.9 kg/m2 Overweight Increased incidence by 20%  30-34.9 kg/m2 Obese (Class I) Increased incidence by 68%  35-39.9 kg/m2 Severe obesity (Class II) Increased incidence by 136%  >40 kg/m2 Extreme obesity (Class III) Increased incidence by 254%   Patient's current BMI Ideal Body weight  There is no height or weight on file to calculate BMI. Patient weight not recorded   BMI Readings from Last 4 Encounters:  10/06/22 32.02 kg/m  09/13/22 29.81 kg/m  07/19/22 31.83 kg/m  07/02/22 34.70 kg/m   Wt Readings from Last 4 Encounters:  10/03/22 175 lb 0.7 oz (79.4 kg)  09/13/22 163 lb (73.9 kg)  07/19/22 174 lb (78.9 kg)  07/02/22 171 lb 12.8 oz (77.9 kg)    Psych/Mental status: Alert, oriented x 3 (person, place, & time)       Eyes: PERLA Respiratory: No evidence of acute  respiratory distress  Assessment  Primary Diagnosis & Pertinent Problem List: The primary encounter diagnosis was Chronic pain syndrome. Diagnoses of Diabetic peripheral neuropathy (HCC), DDD (degenerative disc disease), cervical, Cervical facet joint arthropathy (Multilevel) (Bilateral) (L>R), Chronic shoulder pain (Bilateral), DDD (degenerative disc disease), thoracic, Thoracic foraminal stenosis (Bilateral: T9-10 through T12-L1) (Severe: T12-L1), Thoracic central spinal stenosis (T10-11, T12-L1), Thoracic facet arthropathy (Multilevel) (Bilateral), Abnormal MRI, shoulder (10/01/2014) (Right), Pharmacologic therapy, Chronic anticoagulation (Plavix), Disorder of skeletal system, Osteopenia determined by x-ray, Problems influencing health status, 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), and Chronic kidney disease, unspecified CKD stage were also pertinent to this visit.  Visit Diagnosis (New problems to examiner): 1. Chronic pain syndrome   2. Diabetic peripheral neuropathy (HCC)   3. DDD (degenerative disc disease), cervical   4. Cervical facet joint arthropathy (Multilevel) (Bilateral) (L>R)   5. Chronic shoulder pain (Bilateral)   6. DDD (degenerative disc disease), thoracic   7. Thoracic foraminal stenosis (Bilateral: T9-10 through T12-L1) (Severe: T12-L1)   8. Thoracic central spinal stenosis (T10-11, T12-L1)   9. Thoracic facet arthropathy (Multilevel) (Bilateral)   10. Abnormal MRI, shoulder (10/01/2014) (Right)   11. Pharmacologic therapy   12. Chronic anticoagulation (Plavix)  13. Disorder of skeletal system   14. Osteopenia determined by x-ray   15. Problems influencing health status   16. 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)   17. Chronic kidney disease, unspecified CKD stage    Plan of Care (Initial workup plan)  Note: Gail Paul was reminded that as per protocol, today's visit has been an evaluation only. We have not taken over the patient's controlled substance  management.  Problem-specific plan: No problem-specific Assessment & Plan notes found for this encounter.  Lab Orders  No laboratory test(s) ordered today   Imaging Orders  No imaging studies ordered today   Referral Orders  No referral(s) requested today   Procedure Orders    No procedure(s) ordered today   Pharmacotherapy (current): Medications ordered:  No orders of the defined types were placed in this encounter.  Medications administered during this visit: Gail Paul had no medications administered during this visit.   Analgesic Pharmacotherapy:  Opioid Analgesics: For patients currently taking or requesting to take opioid analgesics, in accordance with [facility] Guidelines, we will assess their risks and indications for the use of these substances. After completing our evaluation, we may offer recommendations, but we no longer take patients for medication management. The prescribing physician will ultimately decide, based on his/her training and level of comfort whether to adopt any of the recommendations, including whether or not to prescribe such medicines.  Membrane stabilizer: To be determined at a later time  Muscle relaxant: To be determined at a later time  NSAID: To be determined at a later time  Other analgesic(s): To be determined at a later time   Interventional management options: Gail Paul was informed that there is no guarantee that she would be a candidate for interventional therapies. The decision will be based on the results of diagnostic studies, as well as Gail Paul's risk profile.  Procedure(s) under consideration:  Pending results of ordered studies      Interventional Therapies  Risk Factors  Considerations  Medical Comorbidities:  PLAVIX Anticoagulation: (Stop: 7-10 days  Restart: 2 hours)  CAD  CHF  Hx. MI (12/26/2012)  Mv Disorder  PVD  CKD  Hx Rhabdomyolysis  T2NIDDM w/ DPN     Planned  Pending:       Under consideration:   Pending   Completed:   None at this time   Therapeutic  Palliative (PRN) options:   None established   Completed by other providers:   None reported      Provider-requested follow-up: No follow-ups on file.  Future Appointments  Date Time Provider Department Center  10/24/2022  1:00 PM Delano Metz, MD [facility] None  11/20/2022  1:15 PM Laurier Nancy, MD [facility] None    Duration of encounter: *** minutes.  Total time on encounter, as per AMA guidelines included both the face-to-face and non-face-to-face time personally spent by the physician and/or other qualified health care professional(s) on the day of the encounter (includes time in activities that require the physician or other qualified health care professional and does not include time in activities normally performed by clinical staff). Physician's time may include the following activities when performed: Preparing to see the patient (e.g., pre-charting review of records, searching for previously ordered imaging, lab work, and nerve conduction tests) Review of prior analgesic pharmacotherapies. Reviewing PMP Interpreting ordered tests (e.g., lab work, imaging, nerve conduction tests) Performing post-procedure evaluations, including interpretation of diagnostic procedures Obtaining and/or reviewing separately obtained history Performing a medically appropriate  examination and/or evaluation Counseling and educating the patient/family/caregiver Ordering medications, tests, or procedures Referring and communicating with other health care professionals (when not separately reported) Documenting clinical information in the electronic or other health record Independently interpreting results (not separately reported) and communicating results to the patient/ family/caregiver Care coordination (not separately reported)  Note by: Oswaldo Done, MD (TTS technology used. I apologize for any  typographical errors that were not detected and corrected.) Date: 10/24/2022; Time: 8:21 AM

## 2022-10-23 ENCOUNTER — Telehealth: Payer: Self-pay | Admitting: Internal Medicine

## 2022-10-23 DIAGNOSIS — M48061 Spinal stenosis, lumbar region without neurogenic claudication: Secondary | ICD-10-CM | POA: Diagnosis not present

## 2022-10-23 DIAGNOSIS — T796XXD Traumatic ischemia of muscle, subsequent encounter: Secondary | ICD-10-CM | POA: Diagnosis not present

## 2022-10-23 DIAGNOSIS — M1812 Unilateral primary osteoarthritis of first carpometacarpal joint, left hand: Secondary | ICD-10-CM | POA: Diagnosis not present

## 2022-10-23 DIAGNOSIS — I5032 Chronic diastolic (congestive) heart failure: Secondary | ICD-10-CM | POA: Diagnosis not present

## 2022-10-23 DIAGNOSIS — W19XXXD Unspecified fall, subsequent encounter: Secondary | ICD-10-CM | POA: Diagnosis not present

## 2022-10-23 DIAGNOSIS — N179 Acute kidney failure, unspecified: Secondary | ICD-10-CM | POA: Diagnosis not present

## 2022-10-23 DIAGNOSIS — I2510 Atherosclerotic heart disease of native coronary artery without angina pectoris: Secondary | ICD-10-CM | POA: Diagnosis not present

## 2022-10-23 DIAGNOSIS — I110 Hypertensive heart disease with heart failure: Secondary | ICD-10-CM | POA: Diagnosis not present

## 2022-10-23 DIAGNOSIS — E1142 Type 2 diabetes mellitus with diabetic polyneuropathy: Secondary | ICD-10-CM | POA: Diagnosis not present

## 2022-10-23 NOTE — Telephone Encounter (Signed)
Soni, PT with [facility] called needing verbal orders for PT 1x8 weeks. Verbal orders given.  Also a personal aide will be coming out twice a week for 6-7 weeks to help bathe and dress.  Patient will still require PCS services additionally.

## 2022-10-23 NOTE — Telephone Encounter (Signed)
Patient left VM that she has a question about her medication but did not state which medication.

## 2022-10-24 ENCOUNTER — Telehealth: Payer: Self-pay | Admitting: Internal Medicine

## 2022-10-24 ENCOUNTER — Ambulatory Visit: Payer: Medicare HMO | Admitting: Pain Medicine

## 2022-10-24 DIAGNOSIS — T796XXD Traumatic ischemia of muscle, subsequent encounter: Secondary | ICD-10-CM | POA: Diagnosis not present

## 2022-10-24 DIAGNOSIS — I5032 Chronic diastolic (congestive) heart failure: Secondary | ICD-10-CM | POA: Diagnosis not present

## 2022-10-24 DIAGNOSIS — M5134 Other intervertebral disc degeneration, thoracic region: Secondary | ICD-10-CM

## 2022-10-24 DIAGNOSIS — E1142 Type 2 diabetes mellitus with diabetic polyneuropathy: Secondary | ICD-10-CM | POA: Diagnosis not present

## 2022-10-24 DIAGNOSIS — W19XXXD Unspecified fall, subsequent encounter: Secondary | ICD-10-CM | POA: Diagnosis not present

## 2022-10-24 DIAGNOSIS — M4804 Spinal stenosis, thoracic region: Secondary | ICD-10-CM

## 2022-10-24 DIAGNOSIS — I2510 Atherosclerotic heart disease of native coronary artery without angina pectoris: Secondary | ICD-10-CM | POA: Diagnosis not present

## 2022-10-24 DIAGNOSIS — I252 Old myocardial infarction: Secondary | ICD-10-CM

## 2022-10-24 DIAGNOSIS — Z79899 Other long term (current) drug therapy: Secondary | ICD-10-CM

## 2022-10-24 DIAGNOSIS — R936 Abnormal findings on diagnostic imaging of limbs: Secondary | ICD-10-CM

## 2022-10-24 DIAGNOSIS — M8580 Other specified disorders of bone density and structure, unspecified site: Secondary | ICD-10-CM

## 2022-10-24 DIAGNOSIS — G8929 Other chronic pain: Secondary | ICD-10-CM

## 2022-10-24 DIAGNOSIS — N179 Acute kidney failure, unspecified: Secondary | ICD-10-CM | POA: Diagnosis not present

## 2022-10-24 DIAGNOSIS — M1812 Unilateral primary osteoarthritis of first carpometacarpal joint, left hand: Secondary | ICD-10-CM | POA: Diagnosis not present

## 2022-10-24 DIAGNOSIS — Z789 Other specified health status: Secondary | ICD-10-CM

## 2022-10-24 DIAGNOSIS — M5030 Other cervical disc degeneration, unspecified cervical region: Secondary | ICD-10-CM

## 2022-10-24 DIAGNOSIS — N189 Chronic kidney disease, unspecified: Secondary | ICD-10-CM

## 2022-10-24 DIAGNOSIS — M899 Disorder of bone, unspecified: Secondary | ICD-10-CM

## 2022-10-24 DIAGNOSIS — I110 Hypertensive heart disease with heart failure: Secondary | ICD-10-CM | POA: Diagnosis not present

## 2022-10-24 DIAGNOSIS — M47812 Spondylosis without myelopathy or radiculopathy, cervical region: Secondary | ICD-10-CM

## 2022-10-24 DIAGNOSIS — Z7901 Long term (current) use of anticoagulants: Secondary | ICD-10-CM

## 2022-10-24 DIAGNOSIS — M47814 Spondylosis without myelopathy or radiculopathy, thoracic region: Secondary | ICD-10-CM

## 2022-10-24 DIAGNOSIS — G894 Chronic pain syndrome: Secondary | ICD-10-CM

## 2022-10-24 DIAGNOSIS — M48061 Spinal stenosis, lumbar region without neurogenic claudication: Secondary | ICD-10-CM | POA: Diagnosis not present

## 2022-10-24 NOTE — Telephone Encounter (Signed)
Patient left VM that she wants to know if it is okay for her to take some of the medications that the hospital sent her home on. The medications are amlodipine 5 mg, acidophilus probiotic, and ondansetron 4 mg. She stopped them today because she read the side effects on them and was afraid to continue them without consulting with Dr. Welton Flakes first. Please advise.

## 2022-10-24 NOTE — Telephone Encounter (Signed)
Judeth Cornfield, OT with [facility], left a VM requesting verbal orders for OT 1 x 4 weeks and then once a week every other week for 2 visits.   Callback # [phone]

## 2022-10-29 DIAGNOSIS — I2510 Atherosclerotic heart disease of native coronary artery without angina pectoris: Secondary | ICD-10-CM | POA: Diagnosis not present

## 2022-10-29 DIAGNOSIS — M48061 Spinal stenosis, lumbar region without neurogenic claudication: Secondary | ICD-10-CM | POA: Diagnosis not present

## 2022-10-29 DIAGNOSIS — W19XXXD Unspecified fall, subsequent encounter: Secondary | ICD-10-CM | POA: Diagnosis not present

## 2022-10-29 DIAGNOSIS — T796XXD Traumatic ischemia of muscle, subsequent encounter: Secondary | ICD-10-CM | POA: Diagnosis not present

## 2022-10-29 DIAGNOSIS — I110 Hypertensive heart disease with heart failure: Secondary | ICD-10-CM | POA: Diagnosis not present

## 2022-10-29 DIAGNOSIS — N179 Acute kidney failure, unspecified: Secondary | ICD-10-CM | POA: Diagnosis not present

## 2022-10-29 DIAGNOSIS — I5032 Chronic diastolic (congestive) heart failure: Secondary | ICD-10-CM | POA: Diagnosis not present

## 2022-10-29 DIAGNOSIS — E1142 Type 2 diabetes mellitus with diabetic polyneuropathy: Secondary | ICD-10-CM | POA: Diagnosis not present

## 2022-10-29 DIAGNOSIS — M1812 Unilateral primary osteoarthritis of first carpometacarpal joint, left hand: Secondary | ICD-10-CM | POA: Diagnosis not present

## 2022-10-30 DIAGNOSIS — M48061 Spinal stenosis, lumbar region without neurogenic claudication: Secondary | ICD-10-CM | POA: Diagnosis not present

## 2022-10-30 DIAGNOSIS — N179 Acute kidney failure, unspecified: Secondary | ICD-10-CM | POA: Diagnosis not present

## 2022-10-30 DIAGNOSIS — E1142 Type 2 diabetes mellitus with diabetic polyneuropathy: Secondary | ICD-10-CM | POA: Diagnosis not present

## 2022-10-30 DIAGNOSIS — I110 Hypertensive heart disease with heart failure: Secondary | ICD-10-CM | POA: Diagnosis not present

## 2022-10-30 DIAGNOSIS — I2510 Atherosclerotic heart disease of native coronary artery without angina pectoris: Secondary | ICD-10-CM | POA: Diagnosis not present

## 2022-10-30 DIAGNOSIS — M1812 Unilateral primary osteoarthritis of first carpometacarpal joint, left hand: Secondary | ICD-10-CM | POA: Diagnosis not present

## 2022-10-30 DIAGNOSIS — I5032 Chronic diastolic (congestive) heart failure: Secondary | ICD-10-CM | POA: Diagnosis not present

## 2022-10-30 DIAGNOSIS — T796XXD Traumatic ischemia of muscle, subsequent encounter: Secondary | ICD-10-CM | POA: Diagnosis not present

## 2022-10-30 DIAGNOSIS — W19XXXD Unspecified fall, subsequent encounter: Secondary | ICD-10-CM | POA: Diagnosis not present

## 2022-10-31 DIAGNOSIS — M48061 Spinal stenosis, lumbar region without neurogenic claudication: Secondary | ICD-10-CM | POA: Diagnosis not present

## 2022-10-31 DIAGNOSIS — M1812 Unilateral primary osteoarthritis of first carpometacarpal joint, left hand: Secondary | ICD-10-CM | POA: Diagnosis not present

## 2022-10-31 DIAGNOSIS — N179 Acute kidney failure, unspecified: Secondary | ICD-10-CM | POA: Diagnosis not present

## 2022-10-31 DIAGNOSIS — I110 Hypertensive heart disease with heart failure: Secondary | ICD-10-CM | POA: Diagnosis not present

## 2022-10-31 DIAGNOSIS — I2510 Atherosclerotic heart disease of native coronary artery without angina pectoris: Secondary | ICD-10-CM | POA: Diagnosis not present

## 2022-10-31 DIAGNOSIS — I5032 Chronic diastolic (congestive) heart failure: Secondary | ICD-10-CM | POA: Diagnosis not present

## 2022-10-31 DIAGNOSIS — W19XXXD Unspecified fall, subsequent encounter: Secondary | ICD-10-CM | POA: Diagnosis not present

## 2022-10-31 DIAGNOSIS — E1142 Type 2 diabetes mellitus with diabetic polyneuropathy: Secondary | ICD-10-CM | POA: Diagnosis not present

## 2022-10-31 DIAGNOSIS — T796XXD Traumatic ischemia of muscle, subsequent encounter: Secondary | ICD-10-CM | POA: Diagnosis not present

## 2022-11-02 DIAGNOSIS — M1812 Unilateral primary osteoarthritis of first carpometacarpal joint, left hand: Secondary | ICD-10-CM | POA: Diagnosis not present

## 2022-11-02 DIAGNOSIS — M48061 Spinal stenosis, lumbar region without neurogenic claudication: Secondary | ICD-10-CM | POA: Diagnosis not present

## 2022-11-02 DIAGNOSIS — W19XXXD Unspecified fall, subsequent encounter: Secondary | ICD-10-CM | POA: Diagnosis not present

## 2022-11-02 DIAGNOSIS — E1142 Type 2 diabetes mellitus with diabetic polyneuropathy: Secondary | ICD-10-CM | POA: Diagnosis not present

## 2022-11-02 DIAGNOSIS — I2510 Atherosclerotic heart disease of native coronary artery without angina pectoris: Secondary | ICD-10-CM | POA: Diagnosis not present

## 2022-11-02 DIAGNOSIS — T796XXD Traumatic ischemia of muscle, subsequent encounter: Secondary | ICD-10-CM | POA: Diagnosis not present

## 2022-11-02 DIAGNOSIS — I5032 Chronic diastolic (congestive) heart failure: Secondary | ICD-10-CM | POA: Diagnosis not present

## 2022-11-02 DIAGNOSIS — I110 Hypertensive heart disease with heart failure: Secondary | ICD-10-CM | POA: Diagnosis not present

## 2022-11-02 DIAGNOSIS — N179 Acute kidney failure, unspecified: Secondary | ICD-10-CM | POA: Diagnosis not present

## 2022-11-05 DIAGNOSIS — T796XXD Traumatic ischemia of muscle, subsequent encounter: Secondary | ICD-10-CM | POA: Diagnosis not present

## 2022-11-05 DIAGNOSIS — I5032 Chronic diastolic (congestive) heart failure: Secondary | ICD-10-CM | POA: Diagnosis not present

## 2022-11-05 DIAGNOSIS — M1812 Unilateral primary osteoarthritis of first carpometacarpal joint, left hand: Secondary | ICD-10-CM | POA: Diagnosis not present

## 2022-11-05 DIAGNOSIS — I2510 Atherosclerotic heart disease of native coronary artery without angina pectoris: Secondary | ICD-10-CM | POA: Diagnosis not present

## 2022-11-05 DIAGNOSIS — E1142 Type 2 diabetes mellitus with diabetic polyneuropathy: Secondary | ICD-10-CM | POA: Diagnosis not present

## 2022-11-05 DIAGNOSIS — N179 Acute kidney failure, unspecified: Secondary | ICD-10-CM | POA: Diagnosis not present

## 2022-11-05 DIAGNOSIS — W19XXXD Unspecified fall, subsequent encounter: Secondary | ICD-10-CM | POA: Diagnosis not present

## 2022-11-05 DIAGNOSIS — I110 Hypertensive heart disease with heart failure: Secondary | ICD-10-CM | POA: Diagnosis not present

## 2022-11-05 DIAGNOSIS — M48061 Spinal stenosis, lumbar region without neurogenic claudication: Secondary | ICD-10-CM | POA: Diagnosis not present

## 2022-11-06 ENCOUNTER — Telehealth: Payer: Self-pay

## 2022-11-06 DIAGNOSIS — I5032 Chronic diastolic (congestive) heart failure: Secondary | ICD-10-CM | POA: Diagnosis not present

## 2022-11-06 DIAGNOSIS — M1812 Unilateral primary osteoarthritis of first carpometacarpal joint, left hand: Secondary | ICD-10-CM | POA: Diagnosis not present

## 2022-11-06 DIAGNOSIS — I110 Hypertensive heart disease with heart failure: Secondary | ICD-10-CM | POA: Diagnosis not present

## 2022-11-06 DIAGNOSIS — E1142 Type 2 diabetes mellitus with diabetic polyneuropathy: Secondary | ICD-10-CM | POA: Diagnosis not present

## 2022-11-06 DIAGNOSIS — M48061 Spinal stenosis, lumbar region without neurogenic claudication: Secondary | ICD-10-CM | POA: Diagnosis not present

## 2022-11-06 DIAGNOSIS — I2510 Atherosclerotic heart disease of native coronary artery without angina pectoris: Secondary | ICD-10-CM | POA: Diagnosis not present

## 2022-11-06 DIAGNOSIS — T796XXD Traumatic ischemia of muscle, subsequent encounter: Secondary | ICD-10-CM | POA: Diagnosis not present

## 2022-11-06 DIAGNOSIS — N179 Acute kidney failure, unspecified: Secondary | ICD-10-CM | POA: Diagnosis not present

## 2022-11-06 DIAGNOSIS — W19XXXD Unspecified fall, subsequent encounter: Secondary | ICD-10-CM | POA: Diagnosis not present

## 2022-11-06 NOTE — Telephone Encounter (Signed)
Patient LM aking for call back about her chair for the bathroom

## 2022-11-07 DIAGNOSIS — M48061 Spinal stenosis, lumbar region without neurogenic claudication: Secondary | ICD-10-CM | POA: Diagnosis not present

## 2022-11-07 DIAGNOSIS — N179 Acute kidney failure, unspecified: Secondary | ICD-10-CM | POA: Diagnosis not present

## 2022-11-07 DIAGNOSIS — E1142 Type 2 diabetes mellitus with diabetic polyneuropathy: Secondary | ICD-10-CM | POA: Diagnosis not present

## 2022-11-07 DIAGNOSIS — I5032 Chronic diastolic (congestive) heart failure: Secondary | ICD-10-CM | POA: Diagnosis not present

## 2022-11-07 DIAGNOSIS — W19XXXD Unspecified fall, subsequent encounter: Secondary | ICD-10-CM | POA: Diagnosis not present

## 2022-11-07 DIAGNOSIS — I110 Hypertensive heart disease with heart failure: Secondary | ICD-10-CM | POA: Diagnosis not present

## 2022-11-07 DIAGNOSIS — I2510 Atherosclerotic heart disease of native coronary artery without angina pectoris: Secondary | ICD-10-CM | POA: Diagnosis not present

## 2022-11-07 DIAGNOSIS — M1812 Unilateral primary osteoarthritis of first carpometacarpal joint, left hand: Secondary | ICD-10-CM | POA: Diagnosis not present

## 2022-11-07 DIAGNOSIS — T796XXD Traumatic ischemia of muscle, subsequent encounter: Secondary | ICD-10-CM | POA: Diagnosis not present

## 2022-11-08 DIAGNOSIS — M1812 Unilateral primary osteoarthritis of first carpometacarpal joint, left hand: Secondary | ICD-10-CM | POA: Diagnosis not present

## 2022-11-08 DIAGNOSIS — M48061 Spinal stenosis, lumbar region without neurogenic claudication: Secondary | ICD-10-CM | POA: Diagnosis not present

## 2022-11-08 DIAGNOSIS — W19XXXD Unspecified fall, subsequent encounter: Secondary | ICD-10-CM | POA: Diagnosis not present

## 2022-11-08 DIAGNOSIS — T796XXD Traumatic ischemia of muscle, subsequent encounter: Secondary | ICD-10-CM | POA: Diagnosis not present

## 2022-11-08 DIAGNOSIS — N179 Acute kidney failure, unspecified: Secondary | ICD-10-CM | POA: Diagnosis not present

## 2022-11-08 DIAGNOSIS — I5032 Chronic diastolic (congestive) heart failure: Secondary | ICD-10-CM | POA: Diagnosis not present

## 2022-11-08 DIAGNOSIS — I2510 Atherosclerotic heart disease of native coronary artery without angina pectoris: Secondary | ICD-10-CM | POA: Diagnosis not present

## 2022-11-08 DIAGNOSIS — E1142 Type 2 diabetes mellitus with diabetic polyneuropathy: Secondary | ICD-10-CM | POA: Diagnosis not present

## 2022-11-08 DIAGNOSIS — I110 Hypertensive heart disease with heart failure: Secondary | ICD-10-CM | POA: Diagnosis not present

## 2022-11-09 ENCOUNTER — Ambulatory Visit (INDEPENDENT_AMBULATORY_CARE_PROVIDER_SITE_OTHER): Payer: Medicare HMO | Admitting: Cardiology

## 2022-11-09 ENCOUNTER — Encounter: Payer: Self-pay | Admitting: Cardiology

## 2022-11-09 VITALS — BP 140/80 | HR 79 | Ht 62.0 in | Wt 164.4 lb

## 2022-11-09 DIAGNOSIS — I10 Essential (primary) hypertension: Secondary | ICD-10-CM

## 2022-11-09 DIAGNOSIS — E1142 Type 2 diabetes mellitus with diabetic polyneuropathy: Secondary | ICD-10-CM | POA: Diagnosis not present

## 2022-11-09 DIAGNOSIS — E782 Mixed hyperlipidemia: Secondary | ICD-10-CM | POA: Diagnosis not present

## 2022-11-09 DIAGNOSIS — M8580 Other specified disorders of bone density and structure, unspecified site: Secondary | ICD-10-CM

## 2022-11-09 DIAGNOSIS — R2689 Other abnormalities of gait and mobility: Secondary | ICD-10-CM | POA: Diagnosis not present
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
## 2022-11-13 ENCOUNTER — Telehealth: Payer: Self-pay | Admitting: Internal Medicine

## 2022-11-13 NOTE — Telephone Encounter (Signed)
Kara at [facility] left VM that patient's Medicare does not cover a shower chair, only Medicaid does. But her Medicare will cover a 3 in 1 bedside commode that she could use as a shower chair. They were unable to reach the patient and see if this is something that she would like to try. We need to try to reach her and ask if she would like [place] to order the bedside commode.

## 2022-11-14 ENCOUNTER — Other Ambulatory Visit: Payer: Self-pay

## 2022-11-14 ENCOUNTER — Other Ambulatory Visit: Payer: Medicare HMO

## 2022-11-14 ENCOUNTER — Other Ambulatory Visit: Payer: Self-pay | Admitting: Internal Medicine

## 2022-11-14 DIAGNOSIS — I10 Essential (primary) hypertension: Secondary | ICD-10-CM

## 2022-11-14 DIAGNOSIS — E782 Mixed hyperlipidemia: Secondary | ICD-10-CM

## 2022-11-14 DIAGNOSIS — E1142 Type 2 diabetes mellitus with diabetic polyneuropathy: Secondary | ICD-10-CM

## 2022-11-15 ENCOUNTER — Other Ambulatory Visit: Payer: Self-pay | Admitting: Internal Medicine

## 2022-11-15 DIAGNOSIS — I2510 Atherosclerotic heart disease of native coronary artery without angina pectoris: Secondary | ICD-10-CM

## 2022-11-15 DIAGNOSIS — I739 Peripheral vascular disease, unspecified: Secondary | ICD-10-CM

## 2022-11-15 DIAGNOSIS — M4724 Other spondylosis with radiculopathy, thoracic region: Secondary | ICD-10-CM

## 2022-11-15 LAB — CMP14+EGFR
ALT: 12 IU/L (ref 0–32)
AST: 22 IU/L (ref 0–40)
Albumin: 4.3 g/dL (ref 3.7–4.7)
Alkaline Phosphatase: 60 IU/L (ref 44–121)
BUN/Creatinine Ratio: 17 (ref 12–28)
BUN: 17 mg/dL (ref 8–27)
Bilirubin Total: 0.2 mg/dL (ref 0.0–1.2)
CO2: 22 mmol/L (ref 20–29)
Calcium: 8.9 mg/dL (ref 8.7–10.3)
Chloride: 110 mmol/L — ABNORMAL HIGH (ref 96–106)
Creatinine, Ser: 1.03 mg/dL — ABNORMAL HIGH (ref 0.57–1.00)
Globulin, Total: 1.8 g/dL (ref 1.5–4.5)
Glucose: 94 mg/dL (ref 70–99)
Potassium: 4.4 mmol/L (ref 3.5–5.2)
Sodium: 146 mmol/L — ABNORMAL HIGH (ref 134–144)
Total Protein: 6.1 g/dL (ref 6.0–8.5)
eGFR: 53 mL/min/{1.73_m2} — ABNORMAL LOW (ref >59)

## 2022-11-15 LAB — LIPID PANEL
Chol/HDL Ratio: 4.3 ratio (ref 0.0–4.4)
Cholesterol, Total: 185 mg/dL (ref 100–199)
HDL: 43 mg/dL (ref >39)
LDL Chol Calc (NIH): 105 mg/dL — ABNORMAL HIGH (ref 0–99)
Triglycerides: 216 mg/dL — ABNORMAL HIGH (ref 0–149)
VLDL Cholesterol Cal: 37 mg/dL (ref 5–40)

## 2022-11-15 LAB — HEMOGLOBIN A1C
Est. average glucose Bld gHb Est-mCnc: 128 mg/dL
Hgb A1c MFr Bld: 6.1 % — ABNORMAL HIGH (ref 4.8–5.6)

## 2022-11-19 ENCOUNTER — Ambulatory Visit: Payer: Medicare HMO | Admitting: Cardiovascular Disease

## 2022-11-19 ENCOUNTER — Encounter: Payer: Self-pay | Admitting: Cardiovascular Disease

## 2022-11-19 VITALS — BP 132/68 | HR 85 | Ht 62.0 in | Wt 167.8 lb

## 2022-11-19 DIAGNOSIS — E1142 Type 2 diabetes mellitus with diabetic polyneuropathy: Secondary | ICD-10-CM | POA: Diagnosis not present

## 2022-11-19 DIAGNOSIS — E782 Mixed hyperlipidemia: Secondary | ICD-10-CM | POA: Diagnosis not present

## 2022-11-19 DIAGNOSIS — I110 Hypertensive heart disease with heart failure: Secondary | ICD-10-CM | POA: Diagnosis not present

## 2022-11-19 DIAGNOSIS — I739 Peripheral vascular disease, unspecified: Secondary | ICD-10-CM

## 2022-11-19 DIAGNOSIS — I10 Essential (primary) hypertension: Secondary | ICD-10-CM

## 2022-11-19 DIAGNOSIS — I2510 Atherosclerotic heart disease of native coronary artery without angina pectoris: Secondary | ICD-10-CM | POA: Diagnosis not present

## 2022-11-19 DIAGNOSIS — R55 Syncope and collapse: Secondary | ICD-10-CM | POA: Diagnosis not present

## 2022-11-19 DIAGNOSIS — I059 Rheumatic mitral valve disease, unspecified: Secondary | ICD-10-CM | POA: Diagnosis not present

## 2022-11-19 DIAGNOSIS — M1812 Unilateral primary osteoarthritis of first carpometacarpal joint, left hand: Secondary | ICD-10-CM | POA: Diagnosis not present

## 2022-11-19 DIAGNOSIS — R7989 Other specified abnormal findings of blood chemistry: Secondary | ICD-10-CM | POA: Diagnosis not present

## 2022-11-19 DIAGNOSIS — N179 Acute kidney failure, unspecified: Secondary | ICD-10-CM | POA: Diagnosis not present

## 2022-11-19 DIAGNOSIS — I5032 Chronic diastolic (congestive) heart failure: Secondary | ICD-10-CM | POA: Diagnosis not present

## 2022-11-19 DIAGNOSIS — W19XXXD Unspecified fall, subsequent encounter: Secondary | ICD-10-CM | POA: Diagnosis not present

## 2022-11-19 DIAGNOSIS — T796XXD Traumatic ischemia of muscle, subsequent encounter: Secondary | ICD-10-CM | POA: Diagnosis not present

## 2022-11-19 DIAGNOSIS — M48061 Spinal stenosis, lumbar region without neurogenic claudication: Secondary | ICD-10-CM | POA: Diagnosis not present

## 2022-11-19 MED ORDER — NITROGLYCERIN 0.4 MG SL SUBL
0.4000 mg | SUBLINGUAL_TABLET | SUBLINGUAL | 2 refills | Status: DC | PRN
Start: 2022-11-19 — End: 2022-11-26

## 2022-11-19 NOTE — Progress Notes (Signed)
Cardiology Office Note   Date:  11/19/2022   ID:  Gail Paul, DOB 1934-01-19, MRN [id number]  PCP:  Margaretann Loveless, MD  Cardiologist:  Adrian Blackwater, MD      History of Present Illness: Gail Paul is a 89 y.o. female who presents for  Chief Complaint  Patient presents with   Follow-up    Doing well      Past Medical History:  Diagnosis Date   Arthritis of carpometacarpal [facility]) joint of left thumb 07/11/2021   CHF (congestive heart failure) (HCC)    Heart attack (HCC)    Hypertension    Shortness of breath 12/22/2015   Note: Unchanged     Past Surgical History:  Procedure Laterality Date   BACK SURGERY     carpel tunnel surgery     CATARACT EXTRACTION     ROTATOR CUFF REPAIR Left      Current Outpatient Medications  Medication Sig Dispense Refill   amLODipine (NORVASC) 5 MG tablet Take 5 mg by mouth daily.     cholecalciferol (VITAMIN D) 1000 UNITS tablet Take 1,000 Units by mouth daily.     clopidogrel (PLAVIX) 75 MG tablet Take 75 mg by mouth daily.     feeding supplement (ENSURE ENLIVE / ENSURE PLUS) LIQD Take 237 mLs by mouth 2 (two) times daily between meals.     hydrOXYzine (ATARAX) 10 MG tablet TAKE 1 TABLET BY MOUTH TWICE A DAY 180 tablet 1   methocarbamol (ROBAXIN) 500 MG tablet Take 500 mg by mouth 2 (two) times daily as needed for muscle spasms.     Multiple Vitamins-Minerals (CENTRUM SILVER 50+WOMEN PO) Take by mouth daily.     nitroGLYCERIN (NITROSTAT) 0.4 MG SL tablet Place 1 tablet (0.4 mg total) under the tongue every 5 (five) minutes as needed. 90 tablet 2   OVER THE COUNTER MEDICATION daily. BEET ROOT     PREBIOTIC PRODUCT PO Take by mouth daily.     pregabalin (LYRICA) 50 MG capsule Take 50 mg by mouth daily.     Probiotic Product (PROBIOTIC PO) Take by mouth daily.     rosuvastatin (CRESTOR) 40 MG tablet Take 40 mg by mouth daily.     No current facility-administered medications for this visit.    Allergies:   Shellfish  allergy, Fenofibrate, Meloxicam, and Tramadol    Social History:   reports that she has never smoked. She has never used smokeless tobacco. She reports that she does not drink alcohol and does not use drugs.   Family History:  family history includes Deep vein thrombosis in her mother; Emphysema in her father; Healthy in her brother; Hypertension in her father; Stroke in her mother.    ROS:     Review of Systems  Constitutional: Negative.   HENT: Negative.    Eyes: Negative.   Respiratory: Negative.    Gastrointestinal: Negative.   Genitourinary: Negative.   Musculoskeletal: Negative.   Skin: Negative.   Neurological: Negative.   Endo/Heme/Allergies: Negative.   Psychiatric/Behavioral: Negative.    All other systems reviewed and are negative.     All other systems are reviewed and negative.    PHYSICAL EXAM: VS:  BP 132/68   Pulse 85   Ht 5\' 2"  (1.575 m)   Wt 167 lb 12.8 oz (76.1 kg)   SpO2 98%   BMI 30.69 kg/m  , BMI Body mass index is 30.69 kg/m. Last weight:  Wt Readings from Last 3 Encounters:  11/19/22 167 lb 12.8 oz (76.1 kg)  11/09/22 164 lb 6.4 oz (74.6 kg)  10/03/22 175 lb 0.7 oz (79.4 kg)     Physical Exam Constitutional:      Appearance: Normal appearance.  Cardiovascular:     Rate and Rhythm: Normal rate and regular rhythm.     Heart sounds: Normal heart sounds.  Pulmonary:     Effort: Pulmonary effort is normal.     Breath sounds: Normal breath sounds.  Musculoskeletal:     Right lower leg: No edema.     Left lower leg: No edema.  Neurological:     Mental Status: She is alert.       EKG:   Recent Labs: 10/04/2022: Hemoglobin 10.1; Magnesium 1.8; Platelets 215 11/14/2022: ALT 12; BUN 17; Creatinine, Ser 1.03; Potassium 4.4; Sodium 146    Lipid Panel    Component Value Date/Time   CHOL 185 11/14/2022 1057   CHOL 205 (H) 12/21/2012 1855   TRIG 216 (H) 11/14/2022 1057   TRIG 167 12/21/2012 1855   HDL 43 11/14/2022 1057   HDL 44  12/21/2012 1855   CHOLHDL 4.3 11/14/2022 1057   CHOLHDL 7.7 09/27/2022 0340   VLDL 39 09/27/2022 0340   VLDL 33 12/21/2012 1855   LDLCALC 105 (H) 11/14/2022 1057   LDLCALC 128 (H) 12/21/2012 1855      Other studies Reviewed: Additional studies/ records that were reviewed today include:  Review of the above records demonstrates:       No data to display            ASSESSMENT AND PLAN:    ICD-10-CM   1. PAD (peripheral artery disease) (HCC)  I73.9 MYOCARDIAL PERFUSION IMAGING    nitroGLYCERIN (NITROSTAT) 0.4 MG SL tablet    2. Coronary artery disease involving native coronary artery of native heart, unspecified whether angina present  I25.10 MYOCARDIAL PERFUSION IMAGING    nitroGLYCERIN (NITROSTAT) 0.4 MG SL tablet    3. Essential hypertension  I10 MYOCARDIAL PERFUSION IMAGING    nitroGLYCERIN (NITROSTAT) 0.4 MG SL tablet    4. Mixed hyperlipidemia  E78.2 MYOCARDIAL PERFUSION IMAGING    nitroGLYCERIN (NITROSTAT) 0.4 MG SL tablet    5. Mitral valve disorder  I05.9 MYOCARDIAL PERFUSION IMAGING    nitroGLYCERIN (NITROSTAT) 0.4 MG SL tablet   mild MR    6. Troponin level elevated  R79.89 MYOCARDIAL PERFUSION IMAGING    nitroGLYCERIN (NITROSTAT) 0.4 MG SL tablet   875 TROPONINS ON 7/24 AT [facility], ADVISE STRESS TEST/ECHO    7. Syncope, unspecified syncope type  R55 nitroGLYCERIN (NITROSTAT) 0.4 MG SL tablet   ELEVATED TROPONINS AS ADMITTED WITH SYNCOPE       Problem List Items Addressed This Visit       Cardiovascular and Mediastinum   CAD (coronary artery disease) (Chronic)   Relevant Medications   nitroGLYCERIN (NITROSTAT) 0.4 MG SL tablet   Other Relevant Orders   MYOCARDIAL PERFUSION IMAGING   Mitral valve disorder (Chronic)   Relevant Medications   nitroGLYCERIN (NITROSTAT) 0.4 MG SL tablet   Other Relevant Orders   MYOCARDIAL PERFUSION IMAGING   Essential hypertension (Chronic)   Relevant Medications   nitroGLYCERIN (NITROSTAT) 0.4 MG SL tablet   Other  Relevant Orders   MYOCARDIAL PERFUSION IMAGING   PAD (peripheral artery disease) (HCC) - Primary   Relevant Medications   nitroGLYCERIN (NITROSTAT) 0.4 MG SL tablet   Other Relevant Orders   MYOCARDIAL PERFUSION IMAGING     Other  Hyperlipidemia   Relevant Medications   nitroGLYCERIN (NITROSTAT) 0.4 MG SL tablet   Other Relevant Orders   MYOCARDIAL PERFUSION IMAGING   Other Visit Diagnoses     Troponin level elevated       875 TROPONINS ON 7/24 AT [facility], ADVISE STRESS TEST/ECHO   Relevant Medications   nitroGLYCERIN (NITROSTAT) 0.4 MG SL tablet   Other Relevant Orders   MYOCARDIAL PERFUSION IMAGING   Syncope, unspecified syncope type       ELEVATED TROPONINS AS ADMITTED WITH SYNCOPE   Relevant Medications   nitroGLYCERIN (NITROSTAT) 0.4 MG SL tablet          Disposition:   Return in about 4 weeks (around 12/17/2022) for STRESS TEST AND F/U.    Total time spent: 30 minutes  Signed,  Adrian Blackwater, MD  11/19/2022 2:36 PM    [facility]

## 2022-11-20 ENCOUNTER — Telehealth: Payer: Self-pay

## 2022-11-20 ENCOUNTER — Ambulatory Visit: Payer: Medicare HMO | Admitting: Cardiovascular Disease

## 2022-11-20 DIAGNOSIS — T796XXD Traumatic ischemia of muscle, subsequent encounter: Secondary | ICD-10-CM | POA: Diagnosis not present

## 2022-11-20 DIAGNOSIS — N179 Acute kidney failure, unspecified: Secondary | ICD-10-CM | POA: Diagnosis not present

## 2022-11-20 DIAGNOSIS — I5032 Chronic diastolic (congestive) heart failure: Secondary | ICD-10-CM | POA: Diagnosis not present

## 2022-11-20 DIAGNOSIS — M1812 Unilateral primary osteoarthritis of first carpometacarpal joint, left hand: Secondary | ICD-10-CM | POA: Diagnosis not present

## 2022-11-20 DIAGNOSIS — I2510 Atherosclerotic heart disease of native coronary artery without angina pectoris: Secondary | ICD-10-CM | POA: Diagnosis not present

## 2022-11-20 DIAGNOSIS — I110 Hypertensive heart disease with heart failure: Secondary | ICD-10-CM | POA: Diagnosis not present

## 2022-11-20 DIAGNOSIS — W19XXXD Unspecified fall, subsequent encounter: Secondary | ICD-10-CM | POA: Diagnosis not present

## 2022-11-20 DIAGNOSIS — M48061 Spinal stenosis, lumbar region without neurogenic claudication: Secondary | ICD-10-CM | POA: Diagnosis not present

## 2022-11-20 DIAGNOSIS — E1142 Type 2 diabetes mellitus with diabetic polyneuropathy: Secondary | ICD-10-CM | POA: Diagnosis not present

## 2022-11-20 NOTE — Telephone Encounter (Signed)
Patient Gail Paul asking for call back states she received a call but couldn't get to the phone in time

## 2022-11-24 DIAGNOSIS — I5032 Chronic diastolic (congestive) heart failure: Secondary | ICD-10-CM | POA: Diagnosis not present

## 2022-11-24 DIAGNOSIS — T796XXD Traumatic ischemia of muscle, subsequent encounter: Secondary | ICD-10-CM | POA: Diagnosis not present

## 2022-11-24 DIAGNOSIS — M1812 Unilateral primary osteoarthritis of first carpometacarpal joint, left hand: Secondary | ICD-10-CM | POA: Diagnosis not present

## 2022-11-24 DIAGNOSIS — M48061 Spinal stenosis, lumbar region without neurogenic claudication: Secondary | ICD-10-CM | POA: Diagnosis not present

## 2022-11-24 DIAGNOSIS — E1142 Type 2 diabetes mellitus with diabetic polyneuropathy: Secondary | ICD-10-CM | POA: Diagnosis not present

## 2022-11-24 DIAGNOSIS — W19XXXD Unspecified fall, subsequent encounter: Secondary | ICD-10-CM | POA: Diagnosis not present

## 2022-11-24 DIAGNOSIS — I2510 Atherosclerotic heart disease of native coronary artery without angina pectoris: Secondary | ICD-10-CM | POA: Diagnosis not present

## 2022-11-24 DIAGNOSIS — N179 Acute kidney failure, unspecified: Secondary | ICD-10-CM | POA: Diagnosis not present

## 2022-11-24 DIAGNOSIS — I110 Hypertensive heart disease with heart failure: Secondary | ICD-10-CM | POA: Diagnosis not present

## 2022-11-26 ENCOUNTER — Other Ambulatory Visit: Payer: Self-pay | Admitting: Internal Medicine

## 2022-11-26 DIAGNOSIS — R7989 Other specified abnormal findings of blood chemistry: Secondary | ICD-10-CM

## 2022-11-26 DIAGNOSIS — R55 Syncope and collapse: Secondary | ICD-10-CM

## 2022-11-26 DIAGNOSIS — I2510 Atherosclerotic heart disease of native coronary artery without angina pectoris: Secondary | ICD-10-CM

## 2022-11-26 DIAGNOSIS — I739 Peripheral vascular disease, unspecified: Secondary | ICD-10-CM

## 2022-11-26 DIAGNOSIS — I10 Essential (primary) hypertension: Secondary | ICD-10-CM

## 2022-11-26 DIAGNOSIS — E782 Mixed hyperlipidemia: Secondary | ICD-10-CM

## 2022-11-26 DIAGNOSIS — I059 Rheumatic mitral valve disease, unspecified: Secondary | ICD-10-CM

## 2022-11-26 MED ORDER — NITROGLYCERIN 0.4 MG SL SUBL: 0.4000 mg | SUBLINGUAL_TABLET | SUBLINGUAL | 2 refills | Status: DC | PRN

## 2022-11-27 DIAGNOSIS — W19XXXD Unspecified fall, subsequent encounter: Secondary | ICD-10-CM | POA: Diagnosis not present

## 2022-11-27 DIAGNOSIS — M1812 Unilateral primary osteoarthritis of first carpometacarpal joint, left hand: Secondary | ICD-10-CM | POA: Diagnosis not present

## 2022-11-27 DIAGNOSIS — N179 Acute kidney failure, unspecified: Secondary | ICD-10-CM | POA: Diagnosis not present

## 2022-11-27 DIAGNOSIS — I2510 Atherosclerotic heart disease of native coronary artery without angina pectoris: Secondary | ICD-10-CM | POA: Diagnosis not present

## 2022-11-27 DIAGNOSIS — I110 Hypertensive heart disease with heart failure: Secondary | ICD-10-CM | POA: Diagnosis not present

## 2022-11-27 DIAGNOSIS — E1142 Type 2 diabetes mellitus with diabetic polyneuropathy: Secondary | ICD-10-CM | POA: Diagnosis not present

## 2022-11-27 DIAGNOSIS — I5032 Chronic diastolic (congestive) heart failure: Secondary | ICD-10-CM | POA: Diagnosis not present

## 2022-11-27 DIAGNOSIS — T796XXD Traumatic ischemia of muscle, subsequent encounter: Secondary | ICD-10-CM | POA: Diagnosis not present

## 2022-11-27 DIAGNOSIS — M48061 Spinal stenosis, lumbar region without neurogenic claudication: Secondary | ICD-10-CM | POA: Diagnosis not present

## 2022-11-28 ENCOUNTER — Ambulatory Visit (INDEPENDENT_AMBULATORY_CARE_PROVIDER_SITE_OTHER): Payer: Medicare HMO

## 2022-11-28 DIAGNOSIS — I2510 Atherosclerotic heart disease of native coronary artery without angina pectoris: Secondary | ICD-10-CM

## 2022-11-28 DIAGNOSIS — R7989 Other specified abnormal findings of blood chemistry: Secondary | ICD-10-CM

## 2022-11-28 DIAGNOSIS — I10 Essential (primary) hypertension: Secondary | ICD-10-CM

## 2022-11-28 DIAGNOSIS — E782 Mixed hyperlipidemia: Secondary | ICD-10-CM

## 2022-11-28 DIAGNOSIS — I739 Peripheral vascular disease, unspecified: Secondary | ICD-10-CM

## 2022-11-28 DIAGNOSIS — I059 Rheumatic mitral valve disease, unspecified: Secondary | ICD-10-CM

## 2022-11-28 MED ORDER — TECHNETIUM TC 99M SESTAMIBI GENERIC - CARDIOLITE
32.4000 | Freq: Once | INTRAVENOUS | Status: AC | PRN
  Administered 2022-11-28: 32.4 via INTRAVENOUS

## 2022-11-28 MED ORDER — TECHNETIUM TC 99M SESTAMIBI GENERIC - CARDIOLITE
8.9000 | Freq: Once | INTRAVENOUS | Status: AC | PRN
  Administered 2022-11-28: 8.9 via INTRAVENOUS

## 2022-11-29 ENCOUNTER — Other Ambulatory Visit: Payer: Self-pay

## 2022-11-29 ENCOUNTER — Telehealth: Payer: Self-pay

## 2022-11-29 DIAGNOSIS — I2510 Atherosclerotic heart disease of native coronary artery without angina pectoris: Secondary | ICD-10-CM | POA: Diagnosis not present

## 2022-11-29 DIAGNOSIS — N179 Acute kidney failure, unspecified: Secondary | ICD-10-CM | POA: Diagnosis not present

## 2022-11-29 DIAGNOSIS — W19XXXD Unspecified fall, subsequent encounter: Secondary | ICD-10-CM | POA: Diagnosis not present

## 2022-11-29 DIAGNOSIS — I739 Peripheral vascular disease, unspecified: Secondary | ICD-10-CM

## 2022-11-29 DIAGNOSIS — E1142 Type 2 diabetes mellitus with diabetic polyneuropathy: Secondary | ICD-10-CM | POA: Diagnosis not present

## 2022-11-29 DIAGNOSIS — T796XXD Traumatic ischemia of muscle, subsequent encounter: Secondary | ICD-10-CM | POA: Diagnosis not present

## 2022-11-29 DIAGNOSIS — R55 Syncope and collapse: Secondary | ICD-10-CM

## 2022-11-29 DIAGNOSIS — R7989 Other specified abnormal findings of blood chemistry: Secondary | ICD-10-CM

## 2022-11-29 DIAGNOSIS — I10 Essential (primary) hypertension: Secondary | ICD-10-CM

## 2022-11-29 DIAGNOSIS — I110 Hypertensive heart disease with heart failure: Secondary | ICD-10-CM | POA: Diagnosis not present

## 2022-11-29 DIAGNOSIS — I059 Rheumatic mitral valve disease, unspecified: Secondary | ICD-10-CM

## 2022-11-29 DIAGNOSIS — I5032 Chronic diastolic (congestive) heart failure: Secondary | ICD-10-CM | POA: Diagnosis not present

## 2022-11-29 DIAGNOSIS — M1812 Unilateral primary osteoarthritis of first carpometacarpal joint, left hand: Secondary | ICD-10-CM | POA: Diagnosis not present

## 2022-11-29 DIAGNOSIS — E782 Mixed hyperlipidemia: Secondary | ICD-10-CM

## 2022-11-29 DIAGNOSIS — M48061 Spinal stenosis, lumbar region without neurogenic claudication: Secondary | ICD-10-CM | POA: Diagnosis not present

## 2022-11-30 ENCOUNTER — Telehealth: Payer: Self-pay

## 2022-11-30 ENCOUNTER — Other Ambulatory Visit: Payer: Self-pay

## 2022-11-30 ENCOUNTER — Other Ambulatory Visit: Payer: Self-pay | Admitting: Internal Medicine

## 2022-11-30 DIAGNOSIS — I5032 Chronic diastolic (congestive) heart failure: Secondary | ICD-10-CM | POA: Diagnosis not present

## 2022-11-30 DIAGNOSIS — I2510 Atherosclerotic heart disease of native coronary artery without angina pectoris: Secondary | ICD-10-CM | POA: Diagnosis not present

## 2022-11-30 DIAGNOSIS — W19XXXD Unspecified fall, subsequent encounter: Secondary | ICD-10-CM | POA: Diagnosis not present

## 2022-11-30 DIAGNOSIS — I110 Hypertensive heart disease with heart failure: Secondary | ICD-10-CM | POA: Diagnosis not present

## 2022-11-30 DIAGNOSIS — M1812 Unilateral primary osteoarthritis of first carpometacarpal joint, left hand: Secondary | ICD-10-CM | POA: Diagnosis not present

## 2022-11-30 DIAGNOSIS — R55 Syncope and collapse: Secondary | ICD-10-CM

## 2022-11-30 DIAGNOSIS — E782 Mixed hyperlipidemia: Secondary | ICD-10-CM

## 2022-11-30 DIAGNOSIS — E1142 Type 2 diabetes mellitus with diabetic polyneuropathy: Secondary | ICD-10-CM | POA: Diagnosis not present

## 2022-11-30 DIAGNOSIS — I059 Rheumatic mitral valve disease, unspecified: Secondary | ICD-10-CM

## 2022-11-30 DIAGNOSIS — I739 Peripheral vascular disease, unspecified: Secondary | ICD-10-CM

## 2022-11-30 DIAGNOSIS — T796XXD Traumatic ischemia of muscle, subsequent encounter: Secondary | ICD-10-CM | POA: Diagnosis not present

## 2022-11-30 DIAGNOSIS — R7989 Other specified abnormal findings of blood chemistry: Secondary | ICD-10-CM

## 2022-11-30 DIAGNOSIS — I10 Essential (primary) hypertension: Secondary | ICD-10-CM

## 2022-11-30 DIAGNOSIS — N179 Acute kidney failure, unspecified: Secondary | ICD-10-CM | POA: Diagnosis not present

## 2022-11-30 DIAGNOSIS — M48061 Spinal stenosis, lumbar region without neurogenic claudication: Secondary | ICD-10-CM | POA: Diagnosis not present

## 2022-11-30 MED ORDER — NITROGLYCERIN 0.4 MG SL SUBL: 0.4000 mg | SUBLINGUAL_TABLET | SUBLINGUAL | 2 refills | Status: DC | PRN

## 2022-11-30 NOTE — Telephone Encounter (Signed)
Patient notified and verbalized understanding. 

## 2022-12-03 ENCOUNTER — Ambulatory Visit: Payer: Medicare HMO | Admitting: Cardiovascular Disease

## 2022-12-03 ENCOUNTER — Encounter: Payer: Self-pay | Admitting: Cardiovascular Disease

## 2022-12-03 VITALS — BP 130/70 | HR 80 | Ht 62.0 in | Wt 166.4 lb

## 2022-12-03 DIAGNOSIS — I739 Peripheral vascular disease, unspecified: Secondary | ICD-10-CM

## 2022-12-03 DIAGNOSIS — E1142 Type 2 diabetes mellitus with diabetic polyneuropathy: Secondary | ICD-10-CM | POA: Diagnosis not present

## 2022-12-03 DIAGNOSIS — W19XXXD Unspecified fall, subsequent encounter: Secondary | ICD-10-CM | POA: Diagnosis not present

## 2022-12-03 DIAGNOSIS — I2510 Atherosclerotic heart disease of native coronary artery without angina pectoris: Secondary | ICD-10-CM

## 2022-12-03 DIAGNOSIS — M1812 Unilateral primary osteoarthritis of first carpometacarpal joint, left hand: Secondary | ICD-10-CM | POA: Diagnosis not present

## 2022-12-03 DIAGNOSIS — R0789 Other chest pain: Secondary | ICD-10-CM

## 2022-12-03 DIAGNOSIS — T796XXD Traumatic ischemia of muscle, subsequent encounter: Secondary | ICD-10-CM | POA: Diagnosis not present

## 2022-12-03 DIAGNOSIS — I10 Essential (primary) hypertension: Secondary | ICD-10-CM | POA: Diagnosis not present

## 2022-12-03 DIAGNOSIS — M48061 Spinal stenosis, lumbar region without neurogenic claudication: Secondary | ICD-10-CM | POA: Diagnosis not present

## 2022-12-03 DIAGNOSIS — I110 Hypertensive heart disease with heart failure: Secondary | ICD-10-CM | POA: Diagnosis not present

## 2022-12-03 DIAGNOSIS — N179 Acute kidney failure, unspecified: Secondary | ICD-10-CM | POA: Diagnosis not present

## 2022-12-03 DIAGNOSIS — I5032 Chronic diastolic (congestive) heart failure: Secondary | ICD-10-CM | POA: Diagnosis not present

## 2022-12-03 MED ORDER — PANTOPRAZOLE SODIUM 40 MG PO TBEC
40.0000 mg | DELAYED_RELEASE_TABLET | Freq: Every day | ORAL | 1 refills | Status: DC
Start: 2022-12-03 — End: 2022-12-26

## 2022-12-03 MED ORDER — SPIRONOLACTONE 25 MG PO TABS: 12.5000 mg | ORAL_TABLET | Freq: Every day | ORAL | 11 refills | Status: DC

## 2022-12-03 NOTE — Progress Notes (Signed)
Cardiology Office Note   Date:  12/03/2022   ID:  Gail, Paul February 19, 1934, MRN [id number]  PCP:  Margaretann Loveless, MD  Cardiologist:  Adrian Blackwater, MD      History of Present Illness: Gail Paul is a 89 y.o. female who presents for No chief complaint on file.   Doing well      Past Medical History:  Diagnosis Date   Arthritis of carpometacarpal [facility]) joint of left thumb 07/11/2021   CHF (congestive heart failure) (HCC)    Heart attack (HCC)    Hypertension    Shortness of breath 12/22/2015   Note: Unchanged     Past Surgical History:  Procedure Laterality Date   BACK SURGERY     carpel tunnel surgery     CATARACT EXTRACTION     ROTATOR CUFF REPAIR Left      Current Outpatient Medications  Medication Sig Dispense Refill   cholecalciferol (VITAMIN D) 1000 UNITS tablet Take 1,000 Units by mouth daily.     clopidogrel (PLAVIX) 75 MG tablet Take 75 mg by mouth daily.     hydrOXYzine (ATARAX) 10 MG tablet TAKE 1 TABLET BY MOUTH TWICE A DAY 180 tablet 1   meloxicam (MOBIC) 7.5 MG tablet TAKE 1 TABLET BY MOUTH EVERY DAY 30 tablet 3   methocarbamol (ROBAXIN) 500 MG tablet TAKE 1 TABLET BY MOUTH TWICE A DAY 60 tablet 3   Multiple Vitamins-Minerals (CENTRUM SILVER 50+WOMEN PO) Take by mouth daily.     nitroGLYCERIN (NITROSTAT) 0.4 MG SL tablet Place 1 tablet (0.4 mg total) under the tongue every 5 (five) minutes as needed. 90 tablet 2   OVER THE COUNTER MEDICATION daily. BEET ROOT     pantoprazole (PROTONIX) 40 MG tablet Take 1 tablet (40 mg total) by mouth daily. 30 tablet 1   PREBIOTIC PRODUCT PO Take by mouth daily.     pregabalin (LYRICA) 50 MG capsule Take 50 mg by mouth daily.     Probiotic Product (PROBIOTIC PO) Take by mouth daily.     rosuvastatin (CRESTOR) 40 MG tablet TAKE 1 TABLET BY MOUTH EVERY DAY 90 tablet 3   spironolactone (ALDACTONE) 25 MG tablet Take 0.5 tablets (12.5 mg total) by mouth daily. 30 tablet 11   No current  facility-administered medications for this visit.    Allergies:   Shellfish allergy, Fenofibrate, Meloxicam, and Tramadol    Social History:   reports that she has never smoked. She has never used smokeless tobacco. She reports that she does not drink alcohol and does not use drugs.   Family History:  family history includes Deep vein thrombosis in her mother; Emphysema in her father; Healthy in her brother; Hypertension in her father; Stroke in her mother.    ROS:     Review of Systems  Constitutional: Negative.   HENT: Negative.    Eyes: Negative.   Respiratory: Negative.    Gastrointestinal: Negative.   Genitourinary: Negative.   Musculoskeletal: Negative.   Skin: Negative.   Neurological: Negative.   Endo/Heme/Allergies: Negative.   Psychiatric/Behavioral: Negative.    All other systems reviewed and are negative.     All other systems are reviewed and negative.    PHYSICAL EXAM: VS:  BP 130/70   Pulse 80   Ht 5\' 2"  (1.575 m)   Wt 166 lb 6.4 oz (75.5 kg)   SpO2 97%   BMI 30.43 kg/m  , BMI Body mass index is 30.43 kg/m. Last weight:  Wt Readings from Last 3 Encounters:  12/03/22 166 lb 6.4 oz (75.5 kg)  11/19/22 167 lb 12.8 oz (76.1 kg)  11/09/22 164 lb 6.4 oz (74.6 kg)     Physical Exam Constitutional:      Appearance: Normal appearance.  Cardiovascular:     Rate and Rhythm: Normal rate and regular rhythm.     Heart sounds: Normal heart sounds.  Pulmonary:     Effort: Pulmonary effort is normal.     Breath sounds: Normal breath sounds.  Musculoskeletal:     Right lower leg: No edema.     Left lower leg: No edema.  Neurological:     Mental Status: She is alert.       EKG:   Recent Labs: 10/04/2022: Hemoglobin 10.1; Magnesium 1.8; Platelets 215 11/14/2022: ALT 12; BUN 17; Creatinine, Ser 1.03; Potassium 4.4; Sodium 146    Lipid Panel    Component Value Date/Time   CHOL 185 11/14/2022 1057   CHOL 205 (H) 12/21/2012 1855   TRIG 216 (H)  11/14/2022 1057   TRIG 167 12/21/2012 1855   HDL 43 11/14/2022 1057   HDL 44 12/21/2012 1855   CHOLHDL 4.3 11/14/2022 1057   CHOLHDL 7.7 09/27/2022 0340   VLDL 39 09/27/2022 0340   VLDL 33 12/21/2012 1855   LDLCALC 105 (H) 11/14/2022 1057   LDLCALC 128 (H) 12/21/2012 1855      Other studies Reviewed: Additional studies/ records that were reviewed today include:  Review of the above records demonstrates:       No data to display            ASSESSMENT AND PLAN:    ICD-10-CM   1. Other chest pain  R07.89 pantoprazole (PROTONIX) 40 MG tablet    spironolactone (ALDACTONE) 25 MG tablet   stress test showed no ischaemia, normal LVEF 60% Protonix 40 as may be GERD    2. Coronary artery disease involving native coronary artery of native heart, unspecified whether angina present  I25.10 pantoprazole (PROTONIX) 40 MG tablet    spironolactone (ALDACTONE) 25 MG tablet    3. Essential hypertension  I10 pantoprazole (PROTONIX) 40 MG tablet    spironolactone (ALDACTONE) 25 MG tablet   has swelling legs and can be due to amlodapine 5, thus will change to aldactone instead as had diastolic  dysfunction on echo and BUN/Craet ok    4. PAD (peripheral artery disease) (HCC)  I73.9 pantoprazole (PROTONIX) 40 MG tablet    spironolactone (ALDACTONE) 25 MG tablet       Problem List Items Addressed This Visit       Cardiovascular and Mediastinum   CAD (coronary artery disease) (Chronic)   Relevant Medications   pantoprazole (PROTONIX) 40 MG tablet   spironolactone (ALDACTONE) 25 MG tablet   Essential hypertension (Chronic)   Relevant Medications   pantoprazole (PROTONIX) 40 MG tablet   spironolactone (ALDACTONE) 25 MG tablet   PAD (peripheral artery disease) (HCC)   Relevant Medications   pantoprazole (PROTONIX) 40 MG tablet   spironolactone (ALDACTONE) 25 MG tablet   Other Visit Diagnoses     Other chest pain    -  Primary   stress test showed no ischaemia, normal LVEF 60%  Protonix 40 as may be GERD   Relevant Medications   pantoprazole (PROTONIX) 40 MG tablet   spironolactone (ALDACTONE) 25 MG tablet          Disposition:   No follow-ups on file.    Total time spent:  30 minutes  Signed,  Adrian Blackwater, MD  12/03/2022 2:00 PM    [facility]

## 2022-12-04 ENCOUNTER — Ambulatory Visit: Payer: Medicare HMO | Admitting: Cardiovascular Disease

## 2022-12-05 DIAGNOSIS — I2510 Atherosclerotic heart disease of native coronary artery without angina pectoris: Secondary | ICD-10-CM | POA: Diagnosis not present

## 2022-12-05 DIAGNOSIS — I5032 Chronic diastolic (congestive) heart failure: Secondary | ICD-10-CM | POA: Diagnosis not present

## 2022-12-05 DIAGNOSIS — W19XXXD Unspecified fall, subsequent encounter: Secondary | ICD-10-CM | POA: Diagnosis not present

## 2022-12-05 DIAGNOSIS — M48061 Spinal stenosis, lumbar region without neurogenic claudication: Secondary | ICD-10-CM | POA: Diagnosis not present

## 2022-12-05 DIAGNOSIS — T796XXD Traumatic ischemia of muscle, subsequent encounter: Secondary | ICD-10-CM | POA: Diagnosis not present

## 2022-12-05 DIAGNOSIS — I110 Hypertensive heart disease with heart failure: Secondary | ICD-10-CM | POA: Diagnosis not present

## 2022-12-05 DIAGNOSIS — E1142 Type 2 diabetes mellitus with diabetic polyneuropathy: Secondary | ICD-10-CM | POA: Diagnosis not present

## 2022-12-05 DIAGNOSIS — N179 Acute kidney failure, unspecified: Secondary | ICD-10-CM | POA: Diagnosis not present

## 2022-12-05 DIAGNOSIS — M1812 Unilateral primary osteoarthritis of first carpometacarpal joint, left hand: Secondary | ICD-10-CM | POA: Diagnosis not present

## 2022-12-06 ENCOUNTER — Encounter: Payer: Self-pay | Admitting: Internal Medicine

## 2022-12-06 DIAGNOSIS — W19XXXD Unspecified fall, subsequent encounter: Secondary | ICD-10-CM | POA: Diagnosis not present

## 2022-12-06 DIAGNOSIS — M48061 Spinal stenosis, lumbar region without neurogenic claudication: Secondary | ICD-10-CM | POA: Diagnosis not present

## 2022-12-06 DIAGNOSIS — N179 Acute kidney failure, unspecified: Secondary | ICD-10-CM | POA: Diagnosis not present

## 2022-12-06 DIAGNOSIS — M1812 Unilateral primary osteoarthritis of first carpometacarpal joint, left hand: Secondary | ICD-10-CM | POA: Diagnosis not present

## 2022-12-06 DIAGNOSIS — I5032 Chronic diastolic (congestive) heart failure: Secondary | ICD-10-CM | POA: Diagnosis not present

## 2022-12-06 DIAGNOSIS — E1142 Type 2 diabetes mellitus with diabetic polyneuropathy: Secondary | ICD-10-CM | POA: Diagnosis not present

## 2022-12-06 DIAGNOSIS — I110 Hypertensive heart disease with heart failure: Secondary | ICD-10-CM | POA: Diagnosis not present

## 2022-12-06 DIAGNOSIS — I2510 Atherosclerotic heart disease of native coronary artery without angina pectoris: Secondary | ICD-10-CM | POA: Diagnosis not present

## 2022-12-06 DIAGNOSIS — T796XXD Traumatic ischemia of muscle, subsequent encounter: Secondary | ICD-10-CM | POA: Diagnosis not present

## 2022-12-07 ENCOUNTER — Other Ambulatory Visit: Payer: Self-pay | Admitting: Internal Medicine

## 2022-12-07 ENCOUNTER — Telehealth: Payer: Self-pay | Admitting: Internal Medicine

## 2022-12-07 DIAGNOSIS — I2510 Atherosclerotic heart disease of native coronary artery without angina pectoris: Secondary | ICD-10-CM

## 2022-12-07 DIAGNOSIS — E782 Mixed hyperlipidemia: Secondary | ICD-10-CM

## 2022-12-07 MED ORDER — OMEGA-3-ACID ETHYL ESTERS 1 G PO CAPS
1.0000 g | ORAL_CAPSULE | Freq: Two times a day (BID) | ORAL | 2 refills | Status: DC
Start: 2022-12-07 — End: 2023-06-06

## 2022-12-07 MED ORDER — ICOSAPENT ETHYL 1 G PO CAPS
2.0000 g | ORAL_CAPSULE | Freq: Two times a day (BID) | ORAL | 3 refills | Status: DC
Start: 2022-12-07 — End: 2022-12-07

## 2022-12-07 NOTE — Telephone Encounter (Signed)
Patient left VM wanting to replace her Vascepa with lovaza. It looks like her Vascepa was stopped when she was d/c from the hospital. Should she resume this? If so can she do the lovaza.

## 2022-12-07 NOTE — Telephone Encounter (Signed)
Patient called back and left VM stating that although the Vascepa is covered with her insurance - she has a deductible to be met. She says she can get the generic Lovaza for $32 at [facility].

## 2022-12-10 DIAGNOSIS — I5032 Chronic diastolic (congestive) heart failure: Secondary | ICD-10-CM | POA: Diagnosis not present

## 2022-12-10 DIAGNOSIS — E1142 Type 2 diabetes mellitus with diabetic polyneuropathy: Secondary | ICD-10-CM | POA: Diagnosis not present

## 2022-12-10 DIAGNOSIS — N179 Acute kidney failure, unspecified: Secondary | ICD-10-CM | POA: Diagnosis not present

## 2022-12-10 DIAGNOSIS — M48061 Spinal stenosis, lumbar region without neurogenic claudication: Secondary | ICD-10-CM | POA: Diagnosis not present

## 2022-12-10 DIAGNOSIS — I2510 Atherosclerotic heart disease of native coronary artery without angina pectoris: Secondary | ICD-10-CM | POA: Diagnosis not present

## 2022-12-10 DIAGNOSIS — I110 Hypertensive heart disease with heart failure: Secondary | ICD-10-CM | POA: Diagnosis not present

## 2022-12-10 DIAGNOSIS — W19XXXD Unspecified fall, subsequent encounter: Secondary | ICD-10-CM | POA: Diagnosis not present

## 2022-12-10 DIAGNOSIS — T796XXD Traumatic ischemia of muscle, subsequent encounter: Secondary | ICD-10-CM | POA: Diagnosis not present

## 2022-12-10 DIAGNOSIS — M1812 Unilateral primary osteoarthritis of first carpometacarpal joint, left hand: Secondary | ICD-10-CM | POA: Diagnosis not present

## 2022-12-11 ENCOUNTER — Ambulatory Visit: Payer: Medicare HMO | Admitting: Internal Medicine

## 2022-12-11 ENCOUNTER — Ambulatory Visit (INDEPENDENT_AMBULATORY_CARE_PROVIDER_SITE_OTHER): Payer: Medicare HMO

## 2022-12-11 ENCOUNTER — Encounter: Payer: Self-pay | Admitting: Internal Medicine

## 2022-12-11 VITALS — BP 140/70 | HR 93 | Ht 62.0 in | Wt 165.4 lb

## 2022-12-11 DIAGNOSIS — M8589 Other specified disorders of bone density and structure, multiple sites: Secondary | ICD-10-CM

## 2022-12-11 DIAGNOSIS — I10 Essential (primary) hypertension: Secondary | ICD-10-CM | POA: Diagnosis not present

## 2022-12-11 DIAGNOSIS — Z91018 Allergy to other foods: Secondary | ICD-10-CM | POA: Diagnosis not present

## 2022-12-11 DIAGNOSIS — E782 Mixed hyperlipidemia: Secondary | ICD-10-CM | POA: Diagnosis not present

## 2022-12-11 DIAGNOSIS — M8580 Other specified disorders of bone density and structure, unspecified site: Secondary | ICD-10-CM

## 2022-12-11 DIAGNOSIS — E1142 Type 2 diabetes mellitus with diabetic polyneuropathy: Secondary | ICD-10-CM | POA: Diagnosis not present

## 2022-12-11 DIAGNOSIS — I2510 Atherosclerotic heart disease of native coronary artery without angina pectoris: Secondary | ICD-10-CM | POA: Diagnosis not present

## 2022-12-11 DIAGNOSIS — Z23 Encounter for immunization: Secondary | ICD-10-CM | POA: Diagnosis not present

## 2022-12-11 LAB — POCT CBG (FASTING - GLUCOSE)-MANUAL ENTRY: Glucose Fasting, POC: 144 mg/dL — AB (ref 70–99)
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
## 2022-12-12 ENCOUNTER — Telehealth: Payer: Self-pay | Admitting: Internal Medicine

## 2022-12-12 DIAGNOSIS — N179 Acute kidney failure, unspecified: Secondary | ICD-10-CM | POA: Diagnosis not present

## 2022-12-12 DIAGNOSIS — M1812 Unilateral primary osteoarthritis of first carpometacarpal joint, left hand: Secondary | ICD-10-CM | POA: Diagnosis not present

## 2022-12-12 DIAGNOSIS — E1142 Type 2 diabetes mellitus with diabetic polyneuropathy: Secondary | ICD-10-CM | POA: Diagnosis not present

## 2022-12-12 DIAGNOSIS — I2510 Atherosclerotic heart disease of native coronary artery without angina pectoris: Secondary | ICD-10-CM | POA: Diagnosis not present

## 2022-12-12 DIAGNOSIS — W19XXXD Unspecified fall, subsequent encounter: Secondary | ICD-10-CM | POA: Diagnosis not present

## 2022-12-12 DIAGNOSIS — M48061 Spinal stenosis, lumbar region without neurogenic claudication: Secondary | ICD-10-CM | POA: Diagnosis not present

## 2022-12-12 DIAGNOSIS — T796XXD Traumatic ischemia of muscle, subsequent encounter: Secondary | ICD-10-CM | POA: Diagnosis not present

## 2022-12-12 DIAGNOSIS — I110 Hypertensive heart disease with heart failure: Secondary | ICD-10-CM | POA: Diagnosis not present

## 2022-12-12 DIAGNOSIS — I5032 Chronic diastolic (congestive) heart failure: Secondary | ICD-10-CM | POA: Diagnosis not present

## 2022-12-12 LAB — CMP14+EGFR
ALT: 12 [IU]/L (ref 0–32)
AST: 23 [IU]/L (ref 0–40)
Albumin: 4.3 g/dL (ref 3.7–4.7)
Alkaline Phosphatase: 57 [IU]/L (ref 44–121)
BUN/Creatinine Ratio: 13 (ref 12–28)
BUN: 15 mg/dL (ref 8–27)
Bilirubin Total: 0.5 mg/dL (ref 0.0–1.2)
CO2: 24 mmol/L (ref 20–29)
Calcium: 8.9 mg/dL (ref 8.7–10.3)
Chloride: 107 mmol/L — ABNORMAL HIGH (ref 96–106)
Creatinine, Ser: 1.13 mg/dL — ABNORMAL HIGH (ref 0.57–1.00)
Globulin, Total: 2.2 g/dL (ref 1.5–4.5)
Glucose: 96 mg/dL (ref 70–99)
Potassium: 4.6 mmol/L (ref 3.5–5.2)
Sodium: 145 mmol/L — ABNORMAL HIGH (ref 134–144)
Total Protein: 6.5 g/dL (ref 6.0–8.5)
eGFR: 47 mL/min/{1.73_m2} — ABNORMAL LOW

## 2022-12-12 NOTE — Telephone Encounter (Signed)
Patient left VM requesting results from yesterday. Please advise.

## 2022-12-13 LAB — FOOD ALLERGY PROFILE
Allergen Corn, IgE: 0.16 kU/L — AB
Clam IgE: 0.45 kU/L — AB
Codfish IgE: <0.1 kU/L
Egg White IgE: <0.1 kU/L
Milk IgE: 1.12 kU/L — AB
Peanut IgE: 0.13 kU/L — AB
Scallop IgE: 0.22 kU/L — AB
Sesame Seed IgE: 0.15 kU/L — AB
Shrimp IgE: 0.1 kU/L — AB
Soybean IgE: <0.1 kU/L
Walnut IgE: <0.1 kU/L
Wheat IgE: 0.1 kU/L — AB

## 2022-12-13 LAB — ALLERGEN PROFILE, SHELLFISH
F023-IgE Crab: <0.1 kU/L
F080-IgE Lobster: 0.1 kU/L — AB
F290-IgE Oyster: <0.1 kU/L

## 2022-12-13 NOTE — Progress Notes (Signed)
Patient notified

## 2022-12-21 ENCOUNTER — Telehealth: Payer: Self-pay | Admitting: Internal Medicine

## 2022-12-21 NOTE — Telephone Encounter (Signed)
Patient left VM requesting a copy of her allergy list from her recent labs. Need to send her a copy of the labs and call her to let her know.

## 2022-12-24 NOTE — Telephone Encounter (Signed)
Lab results have been mailed off and VM left informing patient.

## 2022-12-26 ENCOUNTER — Other Ambulatory Visit: Payer: Self-pay | Admitting: Cardiovascular Disease

## 2022-12-26 DIAGNOSIS — I2510 Atherosclerotic heart disease of native coronary artery without angina pectoris: Secondary | ICD-10-CM

## 2022-12-26 DIAGNOSIS — I739 Peripheral vascular disease, unspecified: Secondary | ICD-10-CM

## 2022-12-26 DIAGNOSIS — I10 Essential (primary) hypertension: Secondary | ICD-10-CM

## 2022-12-26 DIAGNOSIS — R0789 Other chest pain: Secondary | ICD-10-CM

## 2023-01-02 ENCOUNTER — Telehealth: Payer: Self-pay | Admitting: Internal Medicine

## 2023-01-02 NOTE — Telephone Encounter (Signed)
Patient left VM requesting a call back to explain her allergy results to her. She also would like the results of her DEXA scan.

## 2023-01-08 NOTE — Telephone Encounter (Signed)
Patient left VM requesting a call back to discuss the results of her allergy tests, states she does not understand what they mean.

## 2023-01-09 NOTE — Telephone Encounter (Signed)
Patient left another VM about this.. I will call her and explain the results this afternoon.

## 2023-01-21 ENCOUNTER — Other Ambulatory Visit: Payer: Self-pay | Admitting: Family

## 2023-02-04 ENCOUNTER — Encounter: Payer: Self-pay | Admitting: Cardiovascular Disease

## 2023-02-04 ENCOUNTER — Ambulatory Visit (INDEPENDENT_AMBULATORY_CARE_PROVIDER_SITE_OTHER): Payer: Medicare HMO | Admitting: Cardiovascular Disease

## 2023-02-04 VITALS — BP 124/84 | HR 94 | Ht 62.0 in | Wt 158.0 lb

## 2023-02-04 DIAGNOSIS — R0789 Other chest pain: Secondary | ICD-10-CM | POA: Diagnosis not present

## 2023-02-04 DIAGNOSIS — I5032 Chronic diastolic (congestive) heart failure: Secondary | ICD-10-CM

## 2023-02-04 DIAGNOSIS — I739 Peripheral vascular disease, unspecified: Secondary | ICD-10-CM

## 2023-02-04 DIAGNOSIS — R55 Syncope and collapse: Secondary | ICD-10-CM | POA: Diagnosis not present

## 2023-02-04 DIAGNOSIS — E782 Mixed hyperlipidemia: Secondary | ICD-10-CM

## 2023-02-04 DIAGNOSIS — I2585 Chronic coronary microvascular dysfunction: Secondary | ICD-10-CM

## 2023-02-04 DIAGNOSIS — I10 Essential (primary) hypertension: Secondary | ICD-10-CM | POA: Diagnosis not present

## 2023-02-04 NOTE — Progress Notes (Signed)
Cardiology Office Note   Date:  02/04/2023   ID:  Gail Paul, DOB 07/19/1933, MRN [id number]  PCP:  Gail Loveless, MD  Cardiologist:  Adrian Blackwater, MD      History of Present Illness: Gail Paul is a 89 y.o. female who presents for  Chief Complaint  Patient presents with   Follow-up    2 month follow up    Doing well      Past Medical History:  Diagnosis Date   Arthritis of carpometacarpal [facility]) joint of left thumb 07/11/2021   CHF (congestive heart failure) (HCC)    Heart attack (HCC)    Hypertension    Shortness of breath 12/22/2015   Note: Unchanged     Past Surgical History:  Procedure Laterality Date   BACK SURGERY     carpel tunnel surgery     CATARACT EXTRACTION     ROTATOR CUFF REPAIR Left      Current Outpatient Medications  Medication Sig Dispense Refill   cholecalciferol (VITAMIN D) 1000 UNITS tablet Take 1,000 Units by mouth daily.     clopidogrel (PLAVIX) 75 MG tablet Take 75 mg by mouth daily.     hydrOXYzine (ATARAX) 10 MG tablet TAKE 1 TABLET BY MOUTH TWICE A DAY 180 tablet 1   meloxicam (MOBIC) 7.5 MG tablet TAKE 1 TABLET BY MOUTH EVERY DAY 30 tablet 3   methocarbamol (ROBAXIN) 500 MG tablet TAKE 1 TABLET BY MOUTH TWICE A DAY 60 tablet 3   Multiple Vitamins-Minerals (CENTRUM SILVER 50+WOMEN PO) Take by mouth daily.     nitroGLYCERIN (NITROSTAT) 0.4 MG SL tablet Place 1 tablet (0.4 mg total) under the tongue every 5 (five) minutes as needed. 90 tablet 2   omega-3 acid ethyl esters (LOVAZA) 1 g capsule Take 1 capsule (1 g total) by mouth 2 (two) times daily. 180 capsule 2   OVER THE COUNTER MEDICATION daily. BEET ROOT     pantoprazole (PROTONIX) 40 MG tablet TAKE 1 TABLET BY MOUTH EVERY DAY 90 tablet 1   PREBIOTIC PRODUCT PO Take by mouth daily.     pregabalin (LYRICA) 50 MG capsule TAKE 1 CAPSULE BY MOUTH 2 TIMES DAILY. 60 capsule 2   Probiotic Product (PROBIOTIC PO) Take by mouth daily.     rosuvastatin (CRESTOR) 40 MG tablet  TAKE 1 TABLET BY MOUTH EVERY DAY 90 tablet 3   spironolactone (ALDACTONE) 25 MG tablet Take 0.5 tablets (12.5 mg total) by mouth daily. 30 tablet 11   VASCEPA 1 g capsule Take 2 g by mouth 2 (two) times daily.     No current facility-administered medications for this visit.    Allergies:   Shellfish allergy, Fenofibrate, Meloxicam, and Tramadol    Social History:   reports that she has never smoked. She has never used smokeless tobacco. She reports that she does not drink alcohol and does not use drugs.   Family History:  family history includes Deep vein thrombosis in her mother; Emphysema in her father; Healthy in her brother; Hypertension in her father; Stroke in her mother.    ROS:     Review of Systems  Constitutional: Negative.   HENT: Negative.    Eyes: Negative.   Respiratory: Negative.    Gastrointestinal: Negative.   Genitourinary: Negative.   Musculoskeletal: Negative.   Skin: Negative.   Neurological: Negative.   Endo/Heme/Allergies: Negative.   Psychiatric/Behavioral: Negative.    All other systems reviewed and are negative.  All other systems are reviewed and negative.    PHYSICAL EXAM: VS:  BP 124/84   Pulse 94   Ht 5\' 2"  (1.575 m)   Wt 158 lb (71.7 kg)   SpO2 97%   BMI 28.90 kg/m  , BMI Body mass index is 28.9 kg/m. Last weight:  Wt Readings from Last 3 Encounters:  02/04/23 158 lb (71.7 kg)  12/11/22 165 lb 6.4 oz (75 kg)  12/03/22 166 lb 6.4 oz (75.5 kg)     Physical Exam Constitutional:      Appearance: Normal appearance.  Cardiovascular:     Rate and Rhythm: Normal rate and regular rhythm.     Heart sounds: Normal heart sounds.  Pulmonary:     Effort: Pulmonary effort is normal.     Breath sounds: Normal breath sounds.  Musculoskeletal:     Right lower leg: No edema.     Left lower leg: No edema.  Neurological:     Mental Status: She is alert.       EKG:   Recent Labs: 10/04/2022: Hemoglobin 10.1; Magnesium 1.8; Platelets  215 12/11/2022: ALT 12; BUN 15; Creatinine, Ser 1.13; Potassium 4.6; Sodium 145    Lipid Panel    Component Value Date/Time   CHOL 185 11/14/2022 1057   CHOL 205 (H) 12/21/2012 1855   TRIG 216 (H) 11/14/2022 1057   TRIG 167 12/21/2012 1855   HDL 43 11/14/2022 1057   HDL 44 12/21/2012 1855   CHOLHDL 4.3 11/14/2022 1057   CHOLHDL 7.7 09/27/2022 0340   VLDL 39 09/27/2022 0340   VLDL 33 12/21/2012 1855   LDLCALC 105 (H) 11/14/2022 1057   LDLCALC 128 (H) 12/21/2012 1855      Other studies Reviewed: Additional studies/ records that were reviewed today include:  Review of the above records demonstrates:       No data to display            ASSESSMENT AND PLAN:    ICD-10-CM   1. Essential hypertension  I10    stable    2. Mixed hyperlipidemia  E78.2     3. Other chest pain  R07.89    Stress test was normal  11/30/22    4. PAD (peripheral artery disease) (HCC)  I73.9     5. Chronic diastolic congestive heart failure (HCC)  I50.32     6. Syncope, unspecified syncope type  R55     7. Chronic coronary microvascular dysfunction  I25.85    Ca score 921, mild to moderate disease in LAD/LCX in 2020       Problem List Items Addressed This Visit       Cardiovascular and Mediastinum   CAD (coronary artery disease) (Chronic)   Relevant Medications   VASCEPA 1 g capsule   Congestive heart failure (HCC) (Chronic)   Relevant Medications   VASCEPA 1 g capsule   Essential hypertension - Primary (Chronic)   Relevant Medications   VASCEPA 1 g capsule   PAD (peripheral artery disease) (HCC)   Relevant Medications   VASCEPA 1 g capsule     Other   Hyperlipidemia   Relevant Medications   VASCEPA 1 g capsule   Other Visit Diagnoses     Other chest pain       Stress test was normal  11/30/22   Syncope, unspecified syncope type              Disposition:   Return in about 4 months (around 06/05/2023).  Total time spent: 30 minutes  Signed,  Adrian Blackwater, MD   02/04/2023 2:47 PM    [facility]

## 2023-02-07 ENCOUNTER — Ambulatory Visit (INDEPENDENT_AMBULATORY_CARE_PROVIDER_SITE_OTHER): Payer: Medicare HMO | Admitting: Internal Medicine

## 2023-02-07 ENCOUNTER — Other Ambulatory Visit: Payer: Medicare HMO

## 2023-02-07 ENCOUNTER — Encounter: Payer: Self-pay | Admitting: Internal Medicine

## 2023-02-07 VITALS — BP 140/80 | HR 81 | Ht 62.0 in | Wt 160.8 lb

## 2023-02-07 DIAGNOSIS — E782 Mixed hyperlipidemia: Secondary | ICD-10-CM | POA: Diagnosis not present

## 2023-02-07 DIAGNOSIS — I10 Essential (primary) hypertension: Secondary | ICD-10-CM

## 2023-02-07 DIAGNOSIS — F411 Generalized anxiety disorder: Secondary | ICD-10-CM | POA: Diagnosis not present

## 2023-02-07 DIAGNOSIS — E559 Vitamin D deficiency, unspecified: Secondary | ICD-10-CM | POA: Diagnosis not present

## 2023-02-07 DIAGNOSIS — R5383 Other fatigue: Secondary | ICD-10-CM

## 2023-02-07 DIAGNOSIS — I2585 Chronic coronary microvascular dysfunction: Secondary | ICD-10-CM

## 2023-02-07 DIAGNOSIS — E538 Deficiency of other specified B group vitamins: Secondary | ICD-10-CM

## 2023-02-07 DIAGNOSIS — E1142 Type 2 diabetes mellitus with diabetic polyneuropathy: Secondary | ICD-10-CM

## 2023-02-07 DIAGNOSIS — Z23 Encounter for immunization: Secondary | ICD-10-CM

## 2023-02-07 LAB — GLUCOSE, POCT (MANUAL RESULT ENTRY): POC Glucose: 94 mg/dL (ref 70–99)

## 2023-02-07 MED ORDER — CITALOPRAM HYDROBROMIDE 20 MG PO TABS: 20.0000 mg | ORAL_TABLET | Freq: Every day | ORAL | 2 refills | Status: DC
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
## 2023-02-08 LAB — LIPID PANEL W/O CHOL/HDL RATIO
Cholesterol, Total: 211 mg/dL — ABNORMAL HIGH (ref 100–199)
HDL: 47 mg/dL (ref >39)
LDL Chol Calc (NIH): 98 mg/dL (ref 0–99)
Triglycerides: 395 mg/dL — ABNORMAL HIGH (ref 0–149)
VLDL Cholesterol Cal: 66 mg/dL — ABNORMAL HIGH (ref 5–40)

## 2023-02-08 LAB — CBC WITH DIFFERENTIAL/PLATELET
Basophils Absolute: 0.1 10*3/uL (ref 0.0–0.2)
Basos: 1 %
EOS (ABSOLUTE): 0.4 10*3/uL (ref 0.0–0.4)
Eos: 7 %
Hematocrit: 36.3 % (ref 34.0–46.6)
Hemoglobin: 11.6 g/dL (ref 11.1–15.9)
Immature Grans (Abs): 0 10*3/uL (ref 0.0–0.1)
Immature Granulocytes: 0 %
Lymphocytes Absolute: 1.7 10*3/uL (ref 0.7–3.1)
Lymphs: 29 %
MCH: 29.5 pg (ref 26.6–33.0)
MCHC: 32 g/dL (ref 31.5–35.7)
MCV: 92 fL (ref 79–97)
Monocytes Absolute: 0.4 10*3/uL (ref 0.1–0.9)
Monocytes: 7 %
Neutrophils Absolute: 3.4 10*3/uL (ref 1.4–7.0)
Neutrophils: 56 %
Platelets: 238 10*3/uL (ref 150–450)
RBC: 3.93 x10E6/uL (ref 3.77–5.28)
RDW: 13.5 % (ref 11.7–15.4)
WBC: 6 10*3/uL (ref 3.4–10.8)

## 2023-02-08 LAB — CMP14+EGFR
ALT: 14 [IU]/L (ref 0–32)
AST: 20 [IU]/L (ref 0–40)
Albumin: 4.4 g/dL (ref 3.7–4.7)
Alkaline Phosphatase: 60 [IU]/L (ref 44–121)
BUN/Creatinine Ratio: 11 — ABNORMAL LOW (ref 12–28)
BUN: 12 mg/dL (ref 8–27)
Bilirubin Total: 0.4 mg/dL (ref 0.0–1.2)
CO2: 21 mmol/L (ref 20–29)
Calcium: 9.1 mg/dL (ref 8.7–10.3)
Chloride: 109 mmol/L — ABNORMAL HIGH (ref 96–106)
Creatinine, Ser: 1.11 mg/dL — ABNORMAL HIGH (ref 0.57–1.00)
Globulin, Total: 2.1 g/dL (ref 1.5–4.5)
Glucose: 86 mg/dL (ref 70–99)
Potassium: 4.3 mmol/L (ref 3.5–5.2)
Sodium: 145 mmol/L — ABNORMAL HIGH (ref 134–144)
Total Protein: 6.5 g/dL (ref 6.0–8.5)
eGFR: 48 mL/min/{1.73_m2} — ABNORMAL LOW (ref >59)

## 2023-02-08 LAB — HEMOGLOBIN A1C
Est. average glucose Bld gHb Est-mCnc: 128 mg/dL
Hgb A1c MFr Bld: 6.1 % — ABNORMAL HIGH (ref 4.8–5.6)

## 2023-02-08 LAB — VITAMIN B12: Vitamin B-12: 405 pg/mL (ref 232–1245)

## 2023-02-08 LAB — VITAMIN D 25 HYDROXY (VIT D DEFICIENCY, FRACTURES): Vit D, 25-Hydroxy: 19.9 ng/mL — ABNORMAL LOW (ref 30.0–100.0)

## 2023-02-08 LAB — TSH: TSH: 3.5 u[IU]/mL (ref 0.450–4.500)

## 2023-02-11 ENCOUNTER — Other Ambulatory Visit: Payer: Self-pay | Admitting: Internal Medicine

## 2023-02-11 DIAGNOSIS — E559 Vitamin D deficiency, unspecified: Secondary | ICD-10-CM

## 2023-02-11 MED ORDER — VITAMIN D3 1000 UNITS PO CAPS
1.0000 | ORAL_CAPSULE | Freq: Every day | ORAL | 2 refills | Status: DC
Start: 2023-02-11 — End: 2023-06-06

## 2023-02-14 ENCOUNTER — Telehealth: Payer: Self-pay

## 2023-02-14 NOTE — Telephone Encounter (Signed)
Pt called stating that she is nauseas just about every day after taking her medication, she wants to know if there is something that can be called in for that, please advise-HQ

## 2023-02-18 ENCOUNTER — Ambulatory Visit: Payer: Medicare HMO | Admitting: Internal Medicine

## 2023-02-18 ENCOUNTER — Other Ambulatory Visit: Payer: Self-pay | Admitting: Internal Medicine

## 2023-02-18 DIAGNOSIS — R110 Nausea: Secondary | ICD-10-CM

## 2023-02-18 MED ORDER — ONDANSETRON HCL 4 MG PO TABS: 4.0000 mg | ORAL_TABLET | Freq: Three times a day (TID) | ORAL | 0 refills | Status: DC | PRN

## 2023-02-20 DIAGNOSIS — Z0100 Encounter for examination of eyes and vision without abnormal findings: Secondary | ICD-10-CM | POA: Diagnosis not present

## 2023-03-01 ENCOUNTER — Encounter: Payer: Self-pay | Admitting: Internal Medicine

## 2023-03-01 ENCOUNTER — Ambulatory Visit (INDEPENDENT_AMBULATORY_CARE_PROVIDER_SITE_OTHER): Payer: Medicare HMO | Admitting: Internal Medicine

## 2023-03-01 VITALS — BP 118/81 | HR 78 | Ht 62.0 in | Wt 151.3 lb

## 2023-03-01 DIAGNOSIS — I10 Essential (primary) hypertension: Secondary | ICD-10-CM

## 2023-03-01 DIAGNOSIS — F411 Generalized anxiety disorder: Secondary | ICD-10-CM | POA: Diagnosis not present

## 2023-03-01 DIAGNOSIS — E782 Mixed hyperlipidemia: Secondary | ICD-10-CM

## 2023-03-01 DIAGNOSIS — E1142 Type 2 diabetes mellitus with diabetic polyneuropathy: Secondary | ICD-10-CM

## 2023-03-01 DIAGNOSIS — I2510 Atherosclerotic heart disease of native coronary artery without angina pectoris: Secondary | ICD-10-CM

## 2023-03-01 LAB — POCT CBG (FASTING - GLUCOSE)-MANUAL ENTRY: Glucose Fasting, POC: 105 mg/dL — AB (ref 70–99)
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
## 2023-03-03 ENCOUNTER — Other Ambulatory Visit: Payer: Self-pay | Admitting: Internal Medicine

## 2023-03-03 DIAGNOSIS — F411 Generalized anxiety disorder: Secondary | ICD-10-CM

## 2023-04-20 ENCOUNTER — Other Ambulatory Visit: Payer: Self-pay | Admitting: Internal Medicine

## 2023-04-20 DIAGNOSIS — I2510 Atherosclerotic heart disease of native coronary artery without angina pectoris: Secondary | ICD-10-CM

## 2023-04-23 NOTE — Telephone Encounter (Signed)
Enter error

## 2023-05-06 ENCOUNTER — Other Ambulatory Visit: Payer: Self-pay | Admitting: Internal Medicine

## 2023-05-06 DIAGNOSIS — J4520 Mild intermittent asthma, uncomplicated: Secondary | ICD-10-CM

## 2023-05-30 ENCOUNTER — Ambulatory Visit: Payer: Medicare HMO | Admitting: Internal Medicine

## 2023-06-04 ENCOUNTER — Encounter: Payer: Self-pay | Admitting: Internal Medicine

## 2023-06-04 ENCOUNTER — Ambulatory Visit (INDEPENDENT_AMBULATORY_CARE_PROVIDER_SITE_OTHER): Admitting: Internal Medicine

## 2023-06-04 VITALS — BP 140/78 | HR 69 | Ht 62.0 in | Wt 154.4 lb

## 2023-06-04 DIAGNOSIS — M791 Myalgia, unspecified site: Secondary | ICD-10-CM

## 2023-06-04 DIAGNOSIS — R0789 Other chest pain: Secondary | ICD-10-CM

## 2023-06-04 DIAGNOSIS — F411 Generalized anxiety disorder: Secondary | ICD-10-CM

## 2023-06-04 DIAGNOSIS — I739 Peripheral vascular disease, unspecified: Secondary | ICD-10-CM | POA: Diagnosis not present

## 2023-06-04 DIAGNOSIS — I10 Essential (primary) hypertension: Secondary | ICD-10-CM | POA: Diagnosis not present

## 2023-06-04 DIAGNOSIS — E559 Vitamin D deficiency, unspecified: Secondary | ICD-10-CM

## 2023-06-04 DIAGNOSIS — E1142 Type 2 diabetes mellitus with diabetic polyneuropathy: Secondary | ICD-10-CM

## 2023-06-04 DIAGNOSIS — I2510 Atherosclerotic heart disease of native coronary artery without angina pectoris: Secondary | ICD-10-CM

## 2023-06-04 DIAGNOSIS — E782 Mixed hyperlipidemia: Secondary | ICD-10-CM | POA: Diagnosis not present

## 2023-06-04 DIAGNOSIS — L308 Other specified dermatitis: Secondary | ICD-10-CM

## 2023-06-04 MED ORDER — TRIAMCINOLONE ACETONIDE 0.025 % EX OINT: 1.0000 | TOPICAL_OINTMENT | Freq: Two times a day (BID) | CUTANEOUS | 0 refills | Status: AC

## 2023-06-04 MED ORDER — PANTOPRAZOLE SODIUM 40 MG PO TBEC: 40.0000 mg | DELAYED_RELEASE_TABLET | Freq: Every day | ORAL | 1 refills | Status: DC

## 2023-06-04 MED ORDER — ROSUVASTATIN CALCIUM 40 MG PO TABS: 40.0000 mg | ORAL_TABLET | Freq: Every day | ORAL | 3 refills | Status: DC

## 2023-06-04 MED ORDER — AMLODIPINE BESYLATE 5 MG PO TABS: 5.0000 mg | ORAL_TABLET | Freq: Every day | ORAL | 3 refills | Status: DC

## 2023-06-04 MED ORDER — CLOPIDOGREL BISULFATE 75 MG PO TABS: 75.0000 mg | ORAL_TABLET | Freq: Every day | ORAL | 3 refills | Status: AC
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
## 2023-06-05 ENCOUNTER — Other Ambulatory Visit

## 2023-06-05 DIAGNOSIS — I2510 Atherosclerotic heart disease of native coronary artery without angina pectoris: Secondary | ICD-10-CM | POA: Diagnosis not present

## 2023-06-05 DIAGNOSIS — I10 Essential (primary) hypertension: Secondary | ICD-10-CM | POA: Diagnosis not present

## 2023-06-05 DIAGNOSIS — M791 Myalgia, unspecified site: Secondary | ICD-10-CM | POA: Diagnosis not present

## 2023-06-05 DIAGNOSIS — E559 Vitamin D deficiency, unspecified: Secondary | ICD-10-CM | POA: Diagnosis not present

## 2023-06-05 DIAGNOSIS — E1142 Type 2 diabetes mellitus with diabetic polyneuropathy: Secondary | ICD-10-CM | POA: Diagnosis not present

## 2023-06-05 DIAGNOSIS — E782 Mixed hyperlipidemia: Secondary | ICD-10-CM | POA: Diagnosis not present

## 2023-06-06 ENCOUNTER — Telehealth: Payer: Self-pay

## 2023-06-06 ENCOUNTER — Ambulatory Visit: Payer: Medicare HMO | Admitting: Cardiovascular Disease

## 2023-06-06 ENCOUNTER — Encounter: Payer: Self-pay | Admitting: Cardiovascular Disease

## 2023-06-06 VITALS — BP 132/74 | HR 85 | Ht 62.0 in | Wt 156.0 lb

## 2023-06-06 DIAGNOSIS — I739 Peripheral vascular disease, unspecified: Secondary | ICD-10-CM

## 2023-06-06 DIAGNOSIS — R55 Syncope and collapse: Secondary | ICD-10-CM

## 2023-06-06 DIAGNOSIS — I2510 Atherosclerotic heart disease of native coronary artery without angina pectoris: Secondary | ICD-10-CM

## 2023-06-06 DIAGNOSIS — I10 Essential (primary) hypertension: Secondary | ICD-10-CM

## 2023-06-06 DIAGNOSIS — R0789 Other chest pain: Secondary | ICD-10-CM | POA: Diagnosis not present

## 2023-06-06 DIAGNOSIS — E782 Mixed hyperlipidemia: Secondary | ICD-10-CM | POA: Diagnosis not present

## 2023-06-06 LAB — CBC WITH DIFFERENTIAL/PLATELET
Basophils Absolute: 0.1 10*3/uL (ref 0.0–0.2)
Basos: 1 %
EOS (ABSOLUTE): 0.3 10*3/uL (ref 0.0–0.4)
Eos: 6 %
Hematocrit: 38.8 % (ref 34.0–46.6)
Hemoglobin: 12.7 g/dL (ref 11.1–15.9)
Immature Grans (Abs): 0 10*3/uL (ref 0.0–0.1)
Immature Granulocytes: 0 %
Lymphocytes Absolute: 1.5 10*3/uL (ref 0.7–3.1)
Lymphs: 30 %
MCH: 30.2 pg (ref 26.6–33.0)
MCHC: 32.7 g/dL (ref 31.5–35.7)
MCV: 92 fL (ref 79–97)
Monocytes Absolute: 0.4 10*3/uL (ref 0.1–0.9)
Monocytes: 9 %
Neutrophils Absolute: 2.6 10*3/uL (ref 1.4–7.0)
Neutrophils: 54 %
Platelets: 235 10*3/uL (ref 150–450)
RBC: 4.2 x10E6/uL (ref 3.77–5.28)
RDW: 14.4 % (ref 11.7–15.4)
WBC: 4.9 10*3/uL (ref 3.4–10.8)

## 2023-06-06 LAB — CK: Total CK: 229 U/L — ABNORMAL HIGH (ref 26–161)

## 2023-06-06 LAB — CMP14+EGFR
ALT: 11 IU/L (ref 0–32)
AST: 16 IU/L (ref 0–40)
Albumin: 4.3 g/dL (ref 3.7–4.7)
Alkaline Phosphatase: 54 IU/L (ref 44–121)
BUN/Creatinine Ratio: 22 (ref 12–28)
BUN: 29 mg/dL — ABNORMAL HIGH (ref 8–27)
Bilirubin Total: 0.4 mg/dL (ref 0.0–1.2)
CO2: 22 mmol/L (ref 20–29)
Calcium: 9.1 mg/dL (ref 8.7–10.3)
Chloride: 107 mmol/L — ABNORMAL HIGH (ref 96–106)
Creatinine, Ser: 1.32 mg/dL — ABNORMAL HIGH (ref 0.57–1.00)
Globulin, Total: 2.1 g/dL (ref 1.5–4.5)
Glucose: 81 mg/dL (ref 70–99)
Potassium: 4.9 mmol/L (ref 3.5–5.2)
Sodium: 144 mmol/L (ref 134–144)
Total Protein: 6.4 g/dL (ref 6.0–8.5)
eGFR: 39 mL/min/{1.73_m2} — ABNORMAL LOW (ref >59)

## 2023-06-06 LAB — HEMOGLOBIN A1C
Est. average glucose Bld gHb Est-mCnc: 126 mg/dL
Hgb A1c MFr Bld: 6 % — ABNORMAL HIGH (ref 4.8–5.6)

## 2023-06-06 LAB — VITAMIN D 25 HYDROXY (VIT D DEFICIENCY, FRACTURES): Vit D, 25-Hydroxy: 25.4 ng/mL — ABNORMAL LOW (ref 30.0–100.0)

## 2023-06-06 LAB — LIPID PANEL W/O CHOL/HDL RATIO
Cholesterol, Total: 309 mg/dL — ABNORMAL HIGH (ref 100–199)
HDL: 45 mg/dL (ref >39)
LDL Chol Calc (NIH): 217 mg/dL — ABNORMAL HIGH (ref 0–99)
Triglycerides: 234 mg/dL — ABNORMAL HIGH (ref 0–149)
VLDL Cholesterol Cal: 47 mg/dL — ABNORMAL HIGH (ref 5–40)

## 2023-06-06 MED ORDER — OMEGA-3-ACID ETHYL ESTERS 1 G PO CAPS: 1.0000 g | ORAL_CAPSULE | Freq: Two times a day (BID) | ORAL | 2 refills | Status: DC

## 2023-06-06 MED ORDER — ROSUVASTATIN CALCIUM 40 MG PO TABS: 40.0000 mg | ORAL_TABLET | Freq: Every day | ORAL | 3 refills | Status: AC

## 2023-06-06 MED ORDER — VASCEPA 1 G PO CAPS: 2.0000 g | ORAL_CAPSULE | Freq: Two times a day (BID) | ORAL | 3 refills | Status: DC

## 2023-06-06 NOTE — Telephone Encounter (Signed)
 Patient called today upset that no one at the front desk ever answers the phone, she said she's tried calling upstairs and then tried calling the down stairs just to confirm if she had an appt today with S khan or not, she said the office services are getting "worse every day" . I was able to confirm with the patient the appt for today and the time she is supposed to be here .

## 2023-06-06 NOTE — Progress Notes (Signed)
 Cardiology Office Note   Date:  06/06/2023   ID:  ROSANN GORUM, DOB Nov 25, 1933, MRN [id number]  PCP:  Margaretann Loveless, MD  Cardiologist:  Adrian Blackwater, MD      History of Present Illness: Gail Paul is a 89 y.o. female who presents for No chief complaint on file.   HPI    Past Medical History:  Diagnosis Date   Arthritis of carpometacarpal [facility]) joint of left thumb 07/11/2021   CHF (congestive heart failure) (HCC)    Heart attack (HCC)    Hypertension    Shortness of breath 12/22/2015   Note: Unchanged     Past Surgical History:  Procedure Laterality Date   BACK SURGERY     carpel tunnel surgery     CATARACT EXTRACTION     ROTATOR CUFF REPAIR Left      Current Outpatient Medications  Medication Sig Dispense Refill   amLODipine (NORVASC) 5 MG tablet Take 1 tablet (5 mg total) by mouth daily. 90 tablet 3   clopidogrel (PLAVIX) 75 MG tablet Take 1 tablet (75 mg total) by mouth daily. 90 tablet 3   methocarbamol (ROBAXIN) 500 MG tablet TAKE 1 TABLET BY MOUTH TWICE A DAY (Patient not taking: Reported on 06/04/2023) 60 tablet 3   Multiple Vitamins-Minerals (CENTRUM SILVER 50+WOMEN PO) Take by mouth daily. (Patient not taking: Reported on 06/04/2023)     omega-3 acid ethyl esters (LOVAZA) 1 g capsule Take 1 capsule (1 g total) by mouth 2 (two) times daily. 180 capsule 2   pantoprazole (PROTONIX) 40 MG tablet Take 1 tablet (40 mg total) by mouth daily. 90 tablet 1   pregabalin (LYRICA) 50 MG capsule TAKE 1 CAPSULE BY MOUTH 2 TIMES DAILY. 60 capsule 2   rosuvastatin (CRESTOR) 40 MG tablet Take 1 tablet (40 mg total) by mouth daily. 90 tablet 3   triamcinolone (KENALOG) 0.025 % ointment Apply 1 Application topically 2 (two) times daily. 80 g 0   VASCEPA 1 g capsule Take 2 capsules (2 g total) by mouth 2 (two) times daily. 120 capsule 3   No current facility-administered medications for this visit.    Allergies:   Shellfish allergy, Fenofibrate, Meloxicam, and Tramadol     Social History:   reports that she has never smoked. She has never used smokeless tobacco. She reports that she does not drink alcohol and does not use drugs.   Family History:  family history includes Deep vein thrombosis in her mother; Emphysema in her father; Healthy in her brother; Hypertension in her father; Stroke in her mother.    ROS:     Review of Systems  Constitutional: Negative.   HENT: Negative.    Eyes: Negative.   Respiratory: Negative.    Gastrointestinal: Negative.   Genitourinary: Negative.   Musculoskeletal: Negative.   Skin: Negative.   Neurological: Negative.   Endo/Heme/Allergies: Negative.   Psychiatric/Behavioral: Negative.    All other systems reviewed and are negative.     All other systems are reviewed and negative.    PHYSICAL EXAM: VS:  BP 132/74   Pulse 85   Ht 5\' 2"  (1.575 m)   Wt 156 lb (70.8 kg)   SpO2 98%   BMI 28.53 kg/m  , BMI Body mass index is 28.53 kg/m. Last weight:  Wt Readings from Last 3 Encounters:  06/06/23 156 lb (70.8 kg)  06/04/23 154 lb 6.4 oz (70 kg)  03/01/23 151 lb 4.8 oz (68.6 kg)  Physical Exam Constitutional:      Appearance: Normal appearance.  Cardiovascular:     Rate and Rhythm: Normal rate and regular rhythm.     Heart sounds: Normal heart sounds.  Pulmonary:     Effort: Pulmonary effort is normal.     Breath sounds: Normal breath sounds.  Musculoskeletal:     Right lower leg: No edema.     Left lower leg: No edema.  Neurological:     Mental Status: She is alert.       EKG:   Recent Labs: 10/04/2022: Magnesium 1.8 02/07/2023: TSH 3.500 06/05/2023: ALT 11; BUN 29; Creatinine, Ser 1.32; Hemoglobin 12.7; Platelets 235; Potassium 4.9; Sodium 144    Lipid Panel    Component Value Date/Time   CHOL 309 (H) 06/05/2023 1136   CHOL 205 (H) 12/21/2012 1855   TRIG 234 (H) 06/05/2023 1136   TRIG 167 12/21/2012 1855   HDL 45 06/05/2023 1136   HDL 44 12/21/2012 1855   CHOLHDL 4.3 11/14/2022  1057   CHOLHDL 7.7 09/27/2022 0340   VLDL 39 09/27/2022 0340   VLDL 33 12/21/2012 1855   LDLCALC 217 (H) 06/05/2023 1136   LDLCALC 128 (H) 12/21/2012 1855      Other studies Reviewed: Additional studies/ records that were reviewed today include:  Review of the above records demonstrates:       No data to display            ASSESSMENT AND PLAN:    ICD-10-CM   1. Mixed hyperlipidemia  E78.2 rosuvastatin (CRESTOR) 40 MG tablet    omega-3 acid ethyl esters (LOVAZA) 1 g capsule    VASCEPA 1 g capsule    2. Coronary artery disease involving native coronary artery of native heart without angina pectoris  I25.10 VASCEPA 1 g capsule   Was not taking medications after syncope. Now refilled it.    3. PAD (peripheral artery disease) (HCC)  I73.9 VASCEPA 1 g capsule    4. Syncope, unspecified syncope type  R55 VASCEPA 1 g capsule   ECHo had trace MR, normal LVEF.    5. Other chest pain  R07.89 VASCEPA 1 g capsule   Feels better and stress test was normal    6. Essential hypertension  I10 VASCEPA 1 g capsule    7. Coronary artery disease involving native coronary artery of native heart, unspecified whether angina present  I25.10 omega-3 acid ethyl esters (LOVAZA) 1 g capsule    VASCEPA 1 g capsule       Problem List Items Addressed This Visit       Cardiovascular and Mediastinum   CAD (coronary artery disease) (Chronic)   Relevant Medications   rosuvastatin (CRESTOR) 40 MG tablet   omega-3 acid ethyl esters (LOVAZA) 1 g capsule   VASCEPA 1 g capsule   Essential hypertension (Chronic)   Relevant Medications   rosuvastatin (CRESTOR) 40 MG tablet   omega-3 acid ethyl esters (LOVAZA) 1 g capsule   VASCEPA 1 g capsule   PAD (peripheral artery disease) (HCC)   Relevant Medications   rosuvastatin (CRESTOR) 40 MG tablet   omega-3 acid ethyl esters (LOVAZA) 1 g capsule   VASCEPA 1 g capsule     Other   Hyperlipidemia - Primary   Relevant Medications   rosuvastatin  (CRESTOR) 40 MG tablet   omega-3 acid ethyl esters (LOVAZA) 1 g capsule   VASCEPA 1 g capsule   Other Visit Diagnoses       Syncope, unspecified  syncope type       ECHo had trace MR, normal LVEF.   Relevant Medications   VASCEPA 1 g capsule     Other chest pain       Feels better and stress test was normal   Relevant Medications   VASCEPA 1 g capsule          Disposition:   Return in about 2 months (around 08/06/2023).    Total time spent: 30 minutes  Signed,  Adrian Blackwater, MD  06/06/2023 2:40 PM    [facility]

## 2023-06-10 NOTE — Progress Notes (Signed)
 Patient notified

## 2023-07-01 ENCOUNTER — Telehealth: Payer: Self-pay | Admitting: Internal Medicine

## 2023-07-01 NOTE — Telephone Encounter (Signed)
 Patient stopped by the office to pick up a form and stopped me and said that her top lip has no feeling and she has no taste buds. She said that she has not been eating peanuts or anything that she is allergic to. She said she is taking all of her meds as prescribed. Does she need to come in for a visit or is there anything we can do as far as sending in a pill, that's what she asked. Please advise on what to do.

## 2023-07-03 NOTE — Telephone Encounter (Signed)
 Patient called back and left VM wanting a call back.   I called patient and had to leave another VM.

## 2023-07-04 ENCOUNTER — Encounter: Payer: Self-pay | Admitting: Internal Medicine

## 2023-07-04 ENCOUNTER — Ambulatory Visit (INDEPENDENT_AMBULATORY_CARE_PROVIDER_SITE_OTHER): Admitting: Internal Medicine

## 2023-07-04 VITALS — BP 122/66 | HR 76 | Ht 62.0 in | Wt 147.0 lb

## 2023-07-04 DIAGNOSIS — I2510 Atherosclerotic heart disease of native coronary artery without angina pectoris: Secondary | ICD-10-CM

## 2023-07-04 DIAGNOSIS — I5032 Chronic diastolic (congestive) heart failure: Secondary | ICD-10-CM

## 2023-07-04 DIAGNOSIS — E559 Vitamin D deficiency, unspecified: Secondary | ICD-10-CM

## 2023-07-04 DIAGNOSIS — I739 Peripheral vascular disease, unspecified: Secondary | ICD-10-CM | POA: Diagnosis not present

## 2023-07-04 DIAGNOSIS — I10 Essential (primary) hypertension: Secondary | ICD-10-CM | POA: Diagnosis not present

## 2023-07-04 DIAGNOSIS — E782 Mixed hyperlipidemia: Secondary | ICD-10-CM

## 2023-07-04 DIAGNOSIS — E1142 Type 2 diabetes mellitus with diabetic polyneuropathy: Secondary | ICD-10-CM | POA: Diagnosis not present

## 2023-07-04 LAB — POCT CBG (FASTING - GLUCOSE)-MANUAL ENTRY: Glucose Fasting, POC: 102 mg/dL — AB (ref 70–99)
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
## 2023-07-08 ENCOUNTER — Encounter: Payer: Self-pay | Admitting: Cardiovascular Disease

## 2023-07-08 ENCOUNTER — Ambulatory Visit: Admitting: Cardiovascular Disease

## 2023-07-08 ENCOUNTER — Telehealth: Payer: Self-pay | Admitting: Internal Medicine

## 2023-07-08 VITALS — BP 120/65 | HR 97 | Ht 62.0 in | Wt 152.0 lb

## 2023-07-08 DIAGNOSIS — I739 Peripheral vascular disease, unspecified: Secondary | ICD-10-CM

## 2023-07-08 DIAGNOSIS — I2510 Atherosclerotic heart disease of native coronary artery without angina pectoris: Secondary | ICD-10-CM | POA: Diagnosis not present

## 2023-07-08 DIAGNOSIS — M5030 Other cervical disc degeneration, unspecified cervical region: Secondary | ICD-10-CM

## 2023-07-08 DIAGNOSIS — R0789 Other chest pain: Secondary | ICD-10-CM

## 2023-07-08 DIAGNOSIS — R55 Syncope and collapse: Secondary | ICD-10-CM

## 2023-07-08 DIAGNOSIS — Z0130 Encounter for examination of blood pressure without abnormal findings: Secondary | ICD-10-CM

## 2023-07-08 DIAGNOSIS — M5134 Other intervertebral disc degeneration, thoracic region: Secondary | ICD-10-CM

## 2023-07-08 DIAGNOSIS — E782 Mixed hyperlipidemia: Secondary | ICD-10-CM

## 2023-07-08 MED ORDER — NITROGLYCERIN 0.4 MG SL SUBL: 0.4000 mg | SUBLINGUAL_TABLET | SUBLINGUAL | 3 refills | Status: AC | PRN

## 2023-07-08 NOTE — Telephone Encounter (Signed)
 Patient stopped by and would like us  to send an order in for her for some forearm crutches, she would like a set of 2. She said someone at the gym let her try their's and she was able to stand up straight with them and it felt much better. If this is okay please place and print order.

## 2023-07-08 NOTE — Progress Notes (Signed)
 Cardiology Office Note   Date:  07/08/2023   ID:  Gail Paul, DOB Nov 02, 1933, MRN [id number]  PCP:  Aisha Hove, MD  Cardiologist:  Debborah Fairly, MD      History of Present Illness: Gail Paul is a 89 y.o. female who presents for  Chief Complaint  Patient presents with   Acute Visit    Chest pains/ no other symptoms    Had chest pin, mostly sharp severe left arm pain and came for urgent evalauation. Had second episode, as EKG needed.      Past Medical History:  Diagnosis Date   Arthritis of carpometacarpal [facility]) joint of left thumb 07/11/2021   CHF (congestive heart failure) (HCC)    Heart attack (HCC)    Hypertension    Shortness of breath 12/22/2015   Note: Unchanged     Past Surgical History:  Procedure Laterality Date   BACK SURGERY     carpel tunnel surgery     CATARACT EXTRACTION     ROTATOR CUFF REPAIR Left      Current Outpatient Medications  Medication Sig Dispense Refill   amLODipine  (NORVASC ) 5 MG tablet Take 1 tablet (5 mg total) by mouth daily. 90 tablet 3   clopidogrel  (PLAVIX ) 75 MG tablet Take 1 tablet (75 mg total) by mouth daily. 90 tablet 3   methocarbamol (ROBAXIN) 500 MG tablet TAKE 1 TABLET BY MOUTH TWICE A DAY 60 tablet 3   Multiple Vitamins-Minerals (CENTRUM SILVER 50+WOMEN PO) Take by mouth daily.     nitroGLYCERIN  (NITROSTAT ) 0.4 MG SL tablet Place 1 tablet (0.4 mg total) under the tongue every 5 (five) minutes as needed for chest pain. 100 tablet 3   omega-3 acid ethyl esters (LOVAZA ) 1 g capsule Take 1 capsule (1 g total) by mouth 2 (two) times daily. 180 capsule 2   pantoprazole  (PROTONIX ) 40 MG tablet Take 1 tablet (40 mg total) by mouth daily. 90 tablet 1   pregabalin  (LYRICA ) 50 MG capsule TAKE 1 CAPSULE BY MOUTH 2 TIMES DAILY. 60 capsule 2   rosuvastatin  (CRESTOR ) 40 MG tablet Take 1 tablet (40 mg total) by mouth daily. 90 tablet 3   triamcinolone  (KENALOG ) 0.025 % ointment Apply 1 Application topically 2 (two)  times daily. 80 g 0   VASCEPA  1 g capsule Take 2 capsules (2 g total) by mouth 2 (two) times daily. 120 capsule 3   No current facility-administered medications for this visit.    Allergies:   Shellfish allergy , Fenofibrate, Meloxicam , and Tramadol     Social History:   reports that she has never smoked. She has never used smokeless tobacco. She reports that she does not drink alcohol and does not use drugs.   Family History:  family history includes Deep vein thrombosis in her mother; Emphysema in her father; Healthy in her brother; Hypertension in her father; Stroke in her mother.    ROS:     Review of Systems  Constitutional: Negative.   HENT: Negative.    Eyes: Negative.   Respiratory: Negative.    Gastrointestinal: Negative.   Genitourinary: Negative.   Musculoskeletal: Negative.   Skin: Negative.   Neurological: Negative.   Endo/Heme/Allergies: Negative.   Psychiatric/Behavioral: Negative.    All other systems reviewed and are negative.     All other systems are reviewed and negative.    PHYSICAL EXAM: VS:  BP 120/65   Pulse 97   Ht 5\' 2"  (1.575 m)   Wt 152 lb (  68.9 kg)   SpO2 (!) 73%   BMI 27.80 kg/m  , BMI Body mass index is 27.8 kg/m. Last weight:  Wt Readings from Last 3 Encounters:  07/08/23 152 lb (68.9 kg)  07/04/23 147 lb (66.7 kg)  06/06/23 156 lb (70.8 kg)     Physical Exam Constitutional:      Appearance: Normal appearance.  Cardiovascular:     Rate and Rhythm: Normal rate and regular rhythm.     Heart sounds: Normal heart sounds.  Pulmonary:     Effort: Pulmonary effort is normal.     Breath sounds: Normal breath sounds.  Musculoskeletal:     Right lower leg: No edema.     Left lower leg: No edema.  Neurological:     Mental Status: She is alert.       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, low voltage  Recent Labs: 10/04/2022: Magnesium  1.8 02/07/2023: TSH 3.500 06/05/2023: ALT 11; BUN 29; Creatinine, Ser 1.32; Hemoglobin 12.7; Platelets  235; Potassium 4.9; Sodium 144    Lipid Panel    Component Value Date/Time   CHOL 309 (H) 06/05/2023 1136   CHOL 205 (H) 12/21/2012 1855   TRIG 234 (H) 06/05/2023 1136   TRIG 167 12/21/2012 1855   HDL 45 06/05/2023 1136   HDL 44 12/21/2012 1855   CHOLHDL 4.3 11/14/2022 1057   CHOLHDL 7.7 09/27/2022 0340   VLDL 39 09/27/2022 0340   VLDL 33 12/21/2012 1855   LDLCALC 217 (H) 06/05/2023 1136   LDLCALC 128 (H) 12/21/2012 1855      Other studies Reviewed: Additional studies/ records that were reviewed today include:  Review of the above records demonstrates:       No data to display            ASSESSMENT AND PLAN:    ICD-10-CM   1. Mixed hyperlipidemia  E78.2 PCV ECHOCARDIOGRAM COMPLETE    MYOCARDIAL PERFUSION IMAGING    nitroGLYCERIN  (NITROSTAT ) 0.4 MG SL tablet   chol very high 300, add cretsor    2. Coronary artery disease involving native coronary artery of native heart without angina pectoris  I25.10 PCV ECHOCARDIOGRAM COMPLETE    MYOCARDIAL PERFUSION IMAGING    nitroGLYCERIN  (NITROSTAT ) 0.4 MG SL tablet    3. PAD (peripheral artery disease) (HCC)  I73.9 PCV ECHOCARDIOGRAM COMPLETE    MYOCARDIAL PERFUSION IMAGING    nitroGLYCERIN  (NITROSTAT ) 0.4 MG SL tablet    4. Syncope, unspecified syncope type  R55 PCV ECHOCARDIOGRAM COMPLETE    MYOCARDIAL PERFUSION IMAGING    nitroGLYCERIN  (NITROSTAT ) 0.4 MG SL tablet    5. Other chest pain  R07.89 PCV ECHOCARDIOGRAM COMPLETE    MYOCARDIAL PERFUSION IMAGING    nitroGLYCERIN  (NITROSTAT ) 0.4 MG SL tablet   EKG NSR 67/min low voltage old lateral MI. ECHO, stress test. Take noitro as needed       Problem List Items Addressed This Visit       Cardiovascular and Mediastinum   CAD (coronary artery disease) (Chronic)   Relevant Medications   nitroGLYCERIN  (NITROSTAT ) 0.4 MG SL tablet   Other Relevant Orders   PCV ECHOCARDIOGRAM COMPLETE   MYOCARDIAL PERFUSION IMAGING   PAD (peripheral artery disease) (HCC)    Relevant Medications   nitroGLYCERIN  (NITROSTAT ) 0.4 MG SL tablet   Other Relevant Orders   PCV ECHOCARDIOGRAM COMPLETE   MYOCARDIAL PERFUSION IMAGING     Other   Hyperlipidemia - Primary   Relevant Medications   nitroGLYCERIN  (NITROSTAT ) 0.4 MG SL tablet  Other Relevant Orders   PCV ECHOCARDIOGRAM COMPLETE   MYOCARDIAL PERFUSION IMAGING   Other Visit Diagnoses       Syncope, unspecified syncope type       Relevant Medications   nitroGLYCERIN  (NITROSTAT ) 0.4 MG SL tablet   Other Relevant Orders   PCV ECHOCARDIOGRAM COMPLETE   MYOCARDIAL PERFUSION IMAGING     Other chest pain       EKG NSR 67/min low voltage old lateral MI. ECHO, stress test. Take noitro as needed   Relevant Medications   nitroGLYCERIN  (NITROSTAT ) 0.4 MG SL tablet   Other Relevant Orders   PCV ECHOCARDIOGRAM COMPLETE   MYOCARDIAL PERFUSION IMAGING          Disposition:   Return in about 3 weeks (around 07/29/2023) for echo, stresss test and f/u.    Total time spent: 30 minutes  Signed,  Debborah Fairly, MD  07/08/2023 10:34 AM    [facility]

## 2023-07-14 ENCOUNTER — Other Ambulatory Visit: Payer: Self-pay | Admitting: Internal Medicine

## 2023-07-14 DIAGNOSIS — F411 Generalized anxiety disorder: Secondary | ICD-10-CM

## 2023-07-15 ENCOUNTER — Telehealth: Payer: Self-pay | Admitting: Internal Medicine

## 2023-07-15 NOTE — Telephone Encounter (Signed)
 Patient called in c/o incontinence of her bowels and bladder. States we will be getting an order to sign from [facility].

## 2023-07-15 NOTE — Addendum Note (Signed)
 Addended byFrancie Irani on: 07/15/2023 01:53 PM   Modules accepted: Orders

## 2023-07-17 ENCOUNTER — Ambulatory Visit (INDEPENDENT_AMBULATORY_CARE_PROVIDER_SITE_OTHER)

## 2023-07-17 ENCOUNTER — Other Ambulatory Visit: Payer: Self-pay | Admitting: Family

## 2023-07-17 DIAGNOSIS — R0789 Other chest pain: Secondary | ICD-10-CM | POA: Diagnosis not present

## 2023-07-17 DIAGNOSIS — I422 Other hypertrophic cardiomyopathy: Secondary | ICD-10-CM

## 2023-07-17 DIAGNOSIS — R55 Syncope and collapse: Secondary | ICD-10-CM

## 2023-07-17 DIAGNOSIS — E782 Mixed hyperlipidemia: Secondary | ICD-10-CM

## 2023-07-17 DIAGNOSIS — I739 Peripheral vascular disease, unspecified: Secondary | ICD-10-CM

## 2023-07-17 DIAGNOSIS — I2510 Atherosclerotic heart disease of native coronary artery without angina pectoris: Secondary | ICD-10-CM

## 2023-07-22 ENCOUNTER — Encounter

## 2023-07-22 ENCOUNTER — Ambulatory Visit

## 2023-07-22 DIAGNOSIS — R0789 Other chest pain: Secondary | ICD-10-CM

## 2023-07-22 DIAGNOSIS — I739 Peripheral vascular disease, unspecified: Secondary | ICD-10-CM

## 2023-07-22 DIAGNOSIS — I2510 Atherosclerotic heart disease of native coronary artery without angina pectoris: Secondary | ICD-10-CM

## 2023-07-22 DIAGNOSIS — E782 Mixed hyperlipidemia: Secondary | ICD-10-CM

## 2023-07-22 DIAGNOSIS — R55 Syncope and collapse: Secondary | ICD-10-CM | POA: Diagnosis not present

## 2023-07-25 ENCOUNTER — Encounter

## 2023-07-29 ENCOUNTER — Encounter: Payer: Self-pay | Admitting: Cardiovascular Disease

## 2023-07-29 ENCOUNTER — Ambulatory Visit (INDEPENDENT_AMBULATORY_CARE_PROVIDER_SITE_OTHER): Admitting: Cardiovascular Disease

## 2023-07-29 VITALS — BP 115/60 | HR 82 | Ht 62.0 in | Wt 153.6 lb

## 2023-07-29 DIAGNOSIS — I739 Peripheral vascular disease, unspecified: Secondary | ICD-10-CM | POA: Diagnosis not present

## 2023-07-29 DIAGNOSIS — I2510 Atherosclerotic heart disease of native coronary artery without angina pectoris: Secondary | ICD-10-CM

## 2023-07-29 DIAGNOSIS — E782 Mixed hyperlipidemia: Secondary | ICD-10-CM

## 2023-07-29 DIAGNOSIS — R2689 Other abnormalities of gait and mobility: Secondary | ICD-10-CM | POA: Diagnosis not present

## 2023-07-29 DIAGNOSIS — R55 Syncope and collapse: Secondary | ICD-10-CM

## 2023-07-29 NOTE — Progress Notes (Addendum)
 Cardiology Office Note   Date:  07/29/2023   ID:  Gail Paul, DOB 10/29/1933, MRN [id number]  PCP:  Aisha Hove, MD  Cardiologist:  Debborah Fairly, MD      History of Present Illness: Gail Paul is a 89 y.o. female who presents for  Chief Complaint  Patient presents with   Follow-up    Echo/NST results    Has back pain and had surgery, and now needs forearm brace. She has problem with balance.       Past Medical History:  Diagnosis Date   Arthritis of carpometacarpal [facility]) joint of left thumb 07/11/2021   CHF (congestive heart failure) (HCC)    Heart attack (HCC)    Hypertension    Shortness of breath 12/22/2015   Note: Unchanged     Past Surgical History:  Procedure Laterality Date   BACK SURGERY     carpel tunnel surgery     CATARACT EXTRACTION     ROTATOR Paul REPAIR Left      Current Outpatient Medications  Medication Sig Dispense Refill   amLODipine  (NORVASC ) 5 MG tablet Take 1 tablet (5 mg total) by mouth daily. 90 tablet 3   citalopram  (CELEXA ) 20 MG tablet TAKE 1 TABLET BY MOUTH EVERY DAY 30 tablet 2   clopidogrel  (PLAVIX ) 75 MG tablet Take 1 tablet (75 mg total) by mouth daily. 90 tablet 3   methocarbamol (ROBAXIN) 500 MG tablet TAKE 1 TABLET BY MOUTH TWICE A DAY 60 tablet 3   Multiple Vitamins-Minerals (CENTRUM SILVER 50+WOMEN PO) Take by mouth daily.     nitroGLYCERIN  (NITROSTAT ) 0.4 MG SL tablet Place 1 tablet (0.4 mg total) under the tongue every 5 (five) minutes as needed for chest pain. 100 tablet 3   omega-3 acid ethyl esters (LOVAZA ) 1 g capsule Take 1 capsule (1 g total) by mouth 2 (two) times daily. 180 capsule 2   pantoprazole  (PROTONIX ) 40 MG tablet Take 1 tablet (40 mg total) by mouth daily. 90 tablet 1   pregabalin  (LYRICA ) 50 MG capsule TAKE 1 CAPSULE BY MOUTH TWICE A DAY 60 capsule 2   rosuvastatin  (CRESTOR ) 40 MG tablet Take 1 tablet (40 mg total) by mouth daily. 90 tablet 3   triamcinolone  (KENALOG ) 0.025 % ointment Apply  1 Application topically 2 (two) times daily. 80 g 0   VASCEPA  1 g capsule Take 2 capsules (2 g total) by mouth 2 (two) times daily. 120 capsule 3   No current facility-administered medications for this visit.    Allergies:   Shellfish allergy , Fenofibrate, Meloxicam , and Tramadol     Social History:   reports that she has never smoked. She has never used smokeless tobacco. She reports that she does not drink alcohol and does not use drugs.   Family History:  family history includes Deep vein thrombosis in her mother; Emphysema in her father; Healthy in her brother; Hypertension in her father; Stroke in her mother.    ROS:     Review of Systems  Constitutional: Negative.   HENT: Negative.    Eyes: Negative.   Respiratory: Negative.    Gastrointestinal: Negative.   Genitourinary: Negative.   Musculoskeletal: Negative.   Skin: Negative.   Neurological: Negative.   Endo/Heme/Allergies: Negative.   Psychiatric/Behavioral: Negative.    All other systems reviewed and are negative.     All other systems are reviewed and negative.    PHYSICAL EXAM: VS:  BP 115/60   Pulse 82  Ht 5\' 2"  (1.575 m)   Wt 153 lb 9.6 oz (69.7 kg)   SpO2 99%   BMI 28.09 kg/m  , BMI Body mass index is 28.09 kg/m. Last weight:  Wt Readings from Last 3 Encounters:  07/29/23 153 lb 9.6 oz (69.7 kg)  07/08/23 152 lb (68.9 kg)  07/04/23 147 lb (66.7 kg)     Physical Exam Constitutional:      Appearance: Normal appearance.  Cardiovascular:     Rate and Rhythm: Normal rate and regular rhythm.     Heart sounds: Normal heart sounds.  Pulmonary:     Effort: Pulmonary effort is normal.     Breath sounds: Normal breath sounds.  Musculoskeletal:     Right lower leg: No edema.     Left lower leg: No edema.  Neurological:     Mental Status: She is alert.       EKG:   Recent Labs: 10/04/2022: Magnesium  1.8 02/07/2023: TSH 3.500 06/05/2023: ALT 11; BUN 29; Creatinine, Ser 1.32; Hemoglobin 12.7;  Platelets 235; Potassium 4.9; Sodium 144    Lipid Panel    Component Value Date/Time   CHOL 309 (H) 06/05/2023 1136   CHOL 205 (H) 12/21/2012 1855   TRIG 234 (H) 06/05/2023 1136   TRIG 167 12/21/2012 1855   HDL 45 06/05/2023 1136   HDL 44 12/21/2012 1855   CHOLHDL 4.3 11/14/2022 1057   CHOLHDL 7.7 09/27/2022 0340   VLDL 39 09/27/2022 0340   VLDL 33 12/21/2012 1855   LDLCALC 217 (H) 06/05/2023 1136   LDLCALC 128 (H) 12/21/2012 1855      Other studies Reviewed: Additional studies/ records that were reviewed today include:  Review of the above records demonstrates:       No data to display            ASSESSMENT AND PLAN:    ICD-10-CM   1. Imbalance  R26.89    Needs forearm Brace crutchs, so locks on and so have better balance, otherwise hunches due to back problems.    2. Mixed hyperlipidemia  E78.2     3. Coronary artery disease involving native coronary artery of native heart without angina pectoris  I25.10    No chest pain, but stress test show small area of ischaemia inferior wall. Normal LVEF.    4. PAD (peripheral artery disease) (HCC)  I73.9     5. Syncope, unspecified syncope type  R55    stress test mild ischaemia RCa territory, and ech has diastolic dysfunction, but normal LVEF.       Problem List Items Addressed This Visit       Cardiovascular and Mediastinum   CAD (coronary artery disease) (Chronic)   PAD (peripheral artery disease) (HCC)     Other   Hyperlipidemia   Other Visit Diagnoses       Imbalance    -  Primary   Needs forearm Brace crutchs, so locks on and so have better balance, otherwise hunches due to back problems.     Syncope, unspecified syncope type       stress test mild ischaemia RCa territory, and ech has diastolic dysfunction, but normal LVEF.          Disposition:   Return in about 2 months (around 09/28/2023).    Total time spent: 30 minutes  Signed,  Debborah Fairly, MD  07/29/2023 1:36 PM    [facility]

## 2023-07-30 DIAGNOSIS — M19042 Primary osteoarthritis, left hand: Secondary | ICD-10-CM | POA: Diagnosis not present

## 2023-08-01 DIAGNOSIS — R269 Unspecified abnormalities of gait and mobility: Secondary | ICD-10-CM | POA: Diagnosis not present

## 2023-08-01 DIAGNOSIS — G8929 Other chronic pain: Secondary | ICD-10-CM | POA: Diagnosis not present

## 2023-08-01 DIAGNOSIS — R2689 Other abnormalities of gait and mobility: Secondary | ICD-10-CM | POA: Diagnosis not present

## 2023-08-01 DIAGNOSIS — M546 Pain in thoracic spine: Secondary | ICD-10-CM | POA: Diagnosis not present

## 2023-08-01 DIAGNOSIS — R29898 Other symptoms and signs involving the musculoskeletal system: Secondary | ICD-10-CM | POA: Diagnosis not present

## 2023-08-05 ENCOUNTER — Telehealth: Payer: Self-pay | Admitting: Internal Medicine

## 2023-08-05 NOTE — Telephone Encounter (Signed)
 Patient left VM requesting a call back about her crutches. The order and all needed info has been sent to [facility]. It is out of my hands now.

## 2023-08-06 ENCOUNTER — Ambulatory Visit: Admitting: Cardiovascular Disease

## 2023-08-06 ENCOUNTER — Encounter: Payer: Self-pay | Admitting: Cardiovascular Disease

## 2023-08-06 ENCOUNTER — Ambulatory Visit (INDEPENDENT_AMBULATORY_CARE_PROVIDER_SITE_OTHER): Admitting: Cardiovascular Disease

## 2023-08-06 VITALS — BP 131/73 | HR 78 | Ht 62.0 in | Wt 149.8 lb

## 2023-08-06 DIAGNOSIS — I2510 Atherosclerotic heart disease of native coronary artery without angina pectoris: Secondary | ICD-10-CM | POA: Diagnosis not present

## 2023-08-06 DIAGNOSIS — I739 Peripheral vascular disease, unspecified: Secondary | ICD-10-CM

## 2023-08-06 DIAGNOSIS — R0602 Shortness of breath: Secondary | ICD-10-CM | POA: Diagnosis not present

## 2023-08-06 DIAGNOSIS — E782 Mixed hyperlipidemia: Secondary | ICD-10-CM

## 2023-08-06 DIAGNOSIS — R55 Syncope and collapse: Secondary | ICD-10-CM | POA: Diagnosis not present

## 2023-08-06 DIAGNOSIS — M19242 Secondary osteoarthritis, left hand: Secondary | ICD-10-CM | POA: Diagnosis not present

## 2023-08-06 DIAGNOSIS — I5033 Acute on chronic diastolic (congestive) heart failure: Secondary | ICD-10-CM | POA: Diagnosis not present

## 2023-08-06 MED ORDER — DAPAGLIFLOZIN PROPANEDIOL 10 MG PO TABS: 10.0000 mg | ORAL_TABLET | Freq: Every day | ORAL | 3 refills | Status: DC

## 2023-08-06 NOTE — Progress Notes (Signed)
 Cardiology Office Note   Date:  08/06/2023   ID:  Gail Paul, DOB Feb 01, 1934, MRN [id number]  PCP:  Aisha Hove, MD  Cardiologist:  Debborah Fairly, MD      History of Present Illness: Gail Paul is a 89 y.o. female who presents for  Chief Complaint  Patient presents with   Acute Visit    Not feeling well    Gets SOB, at rest, after starting medication.      Past Medical History:  Diagnosis Date   Arthritis of carpometacarpal [facility]) joint of left thumb 07/11/2021   CHF (congestive heart failure) (HCC)    Heart attack (HCC)    Hypertension    Shortness of breath 12/22/2015   Note: Unchanged     Past Surgical History:  Procedure Laterality Date   BACK SURGERY     carpel tunnel surgery     CATARACT EXTRACTION     ROTATOR CUFF REPAIR Left      Current Outpatient Medications  Medication Sig Dispense Refill   amLODipine  (NORVASC ) 5 MG tablet Take 1 tablet (5 mg total) by mouth daily. 90 tablet 3   citalopram  (CELEXA ) 20 MG tablet TAKE 1 TABLET BY MOUTH EVERY DAY 30 tablet 2   clopidogrel  (PLAVIX ) 75 MG tablet Take 1 tablet (75 mg total) by mouth daily. 90 tablet 3   dapagliflozin  propanediol (FARXIGA ) 10 MG TABS tablet Take 1 tablet (10 mg total) by mouth daily before breakfast. 30 tablet 3   methocarbamol (ROBAXIN) 500 MG tablet TAKE 1 TABLET BY MOUTH TWICE A DAY 60 tablet 3   Multiple Vitamins-Minerals (CENTRUM SILVER 50+WOMEN PO) Take by mouth daily.     nitroGLYCERIN  (NITROSTAT ) 0.4 MG SL tablet Place 1 tablet (0.4 mg total) under the tongue every 5 (five) minutes as needed for chest pain. 100 tablet 3   omega-3 acid ethyl esters (LOVAZA ) 1 g capsule Take 1 capsule (1 g total) by mouth 2 (two) times daily. 180 capsule 2   pantoprazole  (PROTONIX ) 40 MG tablet Take 1 tablet (40 mg total) by mouth daily. 90 tablet 1   pregabalin  (LYRICA ) 50 MG capsule TAKE 1 CAPSULE BY MOUTH TWICE A DAY 60 capsule 2   rosuvastatin  (CRESTOR ) 40 MG tablet Take 1 tablet (40  mg total) by mouth daily. 90 tablet 3   triamcinolone  (KENALOG ) 0.025 % ointment Apply 1 Application topically 2 (two) times daily. 80 g 0   VASCEPA  1 g capsule Take 2 capsules (2 g total) by mouth 2 (two) times daily. 120 capsule 3   No current facility-administered medications for this visit.    Allergies:   Shellfish allergy , Fenofibrate, Meloxicam , and Tramadol     Social History:   reports that she has never smoked. She has never used smokeless tobacco. She reports that she does not drink alcohol and does not use drugs.   Family History:  family history includes Deep vein thrombosis in her mother; Emphysema in her father; Healthy in her brother; Hypertension in her father; Stroke in her mother.    ROS:     Review of Systems  Constitutional: Negative.   HENT: Negative.    Eyes: Negative.   Respiratory: Negative.    Gastrointestinal: Negative.   Genitourinary: Negative.   Musculoskeletal: Negative.   Skin: Negative.   Neurological: Negative.   Endo/Heme/Allergies: Negative.   Psychiatric/Behavioral: Negative.    All other systems reviewed and are negative.     All other systems are reviewed and negative.  PHYSICAL EXAM: VS:  BP 131/73   Pulse 78   Ht 5\' 2"  (1.575 m)   Wt 149 lb 12.8 oz (67.9 kg)   SpO2 96%   BMI 27.40 kg/m  , BMI Body mass index is 27.4 kg/m. Last weight:  Wt Readings from Last 3 Encounters:  08/06/23 149 lb 12.8 oz (67.9 kg)  07/29/23 153 lb 9.6 oz (69.7 kg)  07/08/23 152 lb (68.9 kg)     Physical Exam Constitutional:      Appearance: Normal appearance.  Cardiovascular:     Rate and Rhythm: Normal rate and regular rhythm.     Heart sounds: Normal heart sounds.  Pulmonary:     Effort: Pulmonary effort is normal.     Breath sounds: Normal breath sounds.  Musculoskeletal:     Right lower leg: No edema.     Left lower leg: No edema.  Neurological:     Mental Status: She is alert.       EKG:   Recent Labs: 10/04/2022: Magnesium   1.8 02/07/2023: TSH 3.500 06/05/2023: ALT 11; BUN 29; Creatinine, Ser 1.32; Hemoglobin 12.7; Platelets 235; Potassium 4.9; Sodium 144    Lipid Panel    Component Value Date/Time   CHOL 309 (H) 06/05/2023 1136   CHOL 205 (H) 12/21/2012 1855   TRIG 234 (H) 06/05/2023 1136   TRIG 167 12/21/2012 1855   HDL 45 06/05/2023 1136   HDL 44 12/21/2012 1855   CHOLHDL 4.3 11/14/2022 1057   CHOLHDL 7.7 09/27/2022 0340   VLDL 39 09/27/2022 0340   VLDL 33 12/21/2012 1855   LDLCALC 217 (H) 06/05/2023 1136   LDLCALC 128 (H) 12/21/2012 1855      Other studies Reviewed: Additional studies/ records that were reviewed today include:  Review of the above records demonstrates:       No data to display            ASSESSMENT AND PLAN:    ICD-10-CM   1. Mixed hyperlipidemia  E78.2 dapagliflozin  propanediol (FARXIGA ) 10 MG TABS tablet    2. Coronary artery disease involving native coronary artery of native heart without angina pectoris  I25.10 dapagliflozin  propanediol (FARXIGA ) 10 MG TABS tablet    3. PAD (peripheral artery disease) (HCC)  I73.9 dapagliflozin  propanediol (FARXIGA ) 10 MG TABS tablet    4. Syncope, unspecified syncope type  R55 dapagliflozin  propanediol (FARXIGA ) 10 MG TABS tablet    5. SOB (shortness of breath)  R06.02 dapagliflozin  propanediol (FARXIGA ) 10 MG TABS tablet    6. CHF (congestive heart failure), NYHA class III, acute on chronic, diastolic (HCC)  I50.33 dapagliflozin  propanediol (FARXIGA ) 10 MG TABS tablet   SOB add farxiga        Problem List Items Addressed This Visit       Cardiovascular and Mediastinum   CAD (coronary artery disease) (Chronic)   Relevant Medications   dapagliflozin  propanediol (FARXIGA ) 10 MG TABS tablet   PAD (peripheral artery disease) (HCC)   Relevant Medications   dapagliflozin  propanediol (FARXIGA ) 10 MG TABS tablet     Other   Hyperlipidemia - Primary   Relevant Medications   dapagliflozin  propanediol (FARXIGA ) 10 MG TABS  tablet   Other Visit Diagnoses       Syncope, unspecified syncope type       Relevant Medications   dapagliflozin  propanediol (FARXIGA ) 10 MG TABS tablet     SOB (shortness of breath)       Relevant Medications   dapagliflozin  propanediol (FARXIGA ) 10 MG TABS  tablet     CHF (congestive heart failure), NYHA class III, acute on chronic, diastolic (HCC)       SOB add farxiga    Relevant Medications   dapagliflozin  propanediol (FARXIGA ) 10 MG TABS tablet          Disposition:   Return in about 1 month (around 09/05/2023).    Total time spent: 30 minutes  Signed,  Debborah Fairly, MD  08/06/2023 1:55 PM    [facility]

## 2023-08-07 ENCOUNTER — Telehealth: Payer: Self-pay | Admitting: Cardiovascular Disease

## 2023-08-07 NOTE — Telephone Encounter (Signed)
 Patient called to let SK and his nurse know that she didn't get the Farxiga  yesterday because it was almost $2,000. Does it need PA? Can you let her know what the outcome is if it does please.

## 2023-08-11 ENCOUNTER — Other Ambulatory Visit: Payer: Self-pay | Admitting: Family

## 2023-08-11 DIAGNOSIS — F411 Generalized anxiety disorder: Secondary | ICD-10-CM

## 2023-08-12 ENCOUNTER — Telehealth: Payer: Self-pay

## 2023-08-12 NOTE — Telephone Encounter (Signed)
 Patient LM asking for you to call her back about a VM she left last week.

## 2023-08-12 NOTE — Telephone Encounter (Signed)
 Patient has called back asking about her phone call last week. Patient states a medication she is taking is saying that if you have an allergy  to shell fish you shouldn't take this. Patient does have a shell fish allergy . Patient states that she has no taste and she can't feel her lips and has been going on for a couple weeks now. Patient is asking if she can go ahead and get her labs done asap.

## 2023-08-13 ENCOUNTER — Telehealth: Payer: Self-pay | Admitting: Internal Medicine

## 2023-08-13 DIAGNOSIS — M546 Pain in thoracic spine: Secondary | ICD-10-CM | POA: Diagnosis not present

## 2023-08-13 DIAGNOSIS — M4805 Spinal stenosis, thoracolumbar region: Secondary | ICD-10-CM | POA: Diagnosis not present

## 2023-08-13 DIAGNOSIS — M48061 Spinal stenosis, lumbar region without neurogenic claudication: Secondary | ICD-10-CM | POA: Diagnosis not present

## 2023-08-13 DIAGNOSIS — R29898 Other symptoms and signs involving the musculoskeletal system: Secondary | ICD-10-CM | POA: Diagnosis not present

## 2023-08-13 DIAGNOSIS — M4802 Spinal stenosis, cervical region: Secondary | ICD-10-CM | POA: Diagnosis not present

## 2023-08-13 DIAGNOSIS — M4804 Spinal stenosis, thoracic region: Secondary | ICD-10-CM | POA: Diagnosis not present

## 2023-08-13 DIAGNOSIS — M4714 Other spondylosis with myelopathy, thoracic region: Secondary | ICD-10-CM | POA: Diagnosis not present

## 2023-08-13 DIAGNOSIS — M47815 Spondylosis without myelopathy or radiculopathy, thoracolumbar region: Secondary | ICD-10-CM | POA: Diagnosis not present

## 2023-08-13 DIAGNOSIS — M47816 Spondylosis without myelopathy or radiculopathy, lumbar region: Secondary | ICD-10-CM | POA: Diagnosis not present

## 2023-08-13 DIAGNOSIS — G8929 Other chronic pain: Secondary | ICD-10-CM | POA: Diagnosis not present

## 2023-08-13 DIAGNOSIS — M4712 Other spondylosis with myelopathy, cervical region: Secondary | ICD-10-CM | POA: Diagnosis not present

## 2023-08-13 DIAGNOSIS — R2689 Other abnormalities of gait and mobility: Secondary | ICD-10-CM | POA: Diagnosis not present

## 2023-08-13 DIAGNOSIS — R269 Unspecified abnormalities of gait and mobility: Secondary | ICD-10-CM | POA: Diagnosis not present

## 2023-08-13 NOTE — Telephone Encounter (Signed)
 Patient left VM stating that [employer] told her that she is due for blood work and her AWV so she feels that she can come get that blood work done now. She states she cannot wait until July to have her labs checked. She is going to call [facility] tomorrow again and make sure she can go ahead and have her labs drawn.

## 2023-08-15 ENCOUNTER — Telehealth: Payer: Self-pay | Admitting: Internal Medicine

## 2023-08-15 ENCOUNTER — Other Ambulatory Visit: Payer: Self-pay | Admitting: Internal Medicine

## 2023-08-15 DIAGNOSIS — I5033 Acute on chronic diastolic (congestive) heart failure: Secondary | ICD-10-CM

## 2023-08-15 DIAGNOSIS — E1142 Type 2 diabetes mellitus with diabetic polyneuropathy: Secondary | ICD-10-CM

## 2023-08-15 DIAGNOSIS — I2510 Atherosclerotic heart disease of native coronary artery without angina pectoris: Secondary | ICD-10-CM

## 2023-08-15 DIAGNOSIS — M19242 Secondary osteoarthritis, left hand: Secondary | ICD-10-CM | POA: Diagnosis not present

## 2023-08-15 DIAGNOSIS — I739 Peripheral vascular disease, unspecified: Secondary | ICD-10-CM

## 2023-08-15 DIAGNOSIS — E782 Mixed hyperlipidemia: Secondary | ICD-10-CM

## 2023-08-15 DIAGNOSIS — Z0000 Encounter for general adult medical examination without abnormal findings: Secondary | ICD-10-CM

## 2023-08-15 DIAGNOSIS — E559 Vitamin D deficiency, unspecified: Secondary | ICD-10-CM

## 2023-08-15 NOTE — Telephone Encounter (Signed)
 Patient called again. She has now spoke with [facility] twice and they told her that she is eligible to have her AWV and labs done. Labs need to be coded to be for her AWV and they will be covered is what they are saying. Patient is persistent that she wants her labs done prior to the appointment. Please order labs so she can come in Monday and have them drawn.

## 2023-08-20 ENCOUNTER — Other Ambulatory Visit

## 2023-08-20 DIAGNOSIS — E559 Vitamin D deficiency, unspecified: Secondary | ICD-10-CM | POA: Diagnosis not present

## 2023-08-20 DIAGNOSIS — E782 Mixed hyperlipidemia: Secondary | ICD-10-CM | POA: Diagnosis not present

## 2023-08-20 DIAGNOSIS — E1142 Type 2 diabetes mellitus with diabetic polyneuropathy: Secondary | ICD-10-CM | POA: Diagnosis not present

## 2023-08-20 DIAGNOSIS — M19242 Secondary osteoarthritis, left hand: Secondary | ICD-10-CM | POA: Diagnosis not present

## 2023-08-20 DIAGNOSIS — Z0000 Encounter for general adult medical examination without abnormal findings: Secondary | ICD-10-CM | POA: Diagnosis not present

## 2023-08-21 LAB — CBC WITH DIFFERENTIAL/PLATELET
Basophils Absolute: 0.1 10*3/uL (ref 0.0–0.2)
Basos: 1 %
EOS (ABSOLUTE): 0.3 10*3/uL (ref 0.0–0.4)
Eos: 4 %
Hematocrit: 41.2 % (ref 34.0–46.6)
Hemoglobin: 13.4 g/dL (ref 11.1–15.9)
Immature Grans (Abs): 0 10*3/uL (ref 0.0–0.1)
Immature Granulocytes: 0 %
Lymphocytes Absolute: 1.6 10*3/uL (ref 0.7–3.1)
Lymphs: 22 %
MCH: 31.1 pg (ref 26.6–33.0)
MCHC: 32.5 g/dL (ref 31.5–35.7)
MCV: 96 fL (ref 79–97)
Monocytes Absolute: 0.4 10*3/uL (ref 0.1–0.9)
Monocytes: 6 %
Neutrophils Absolute: 4.7 10*3/uL (ref 1.4–7.0)
Neutrophils: 67 %
Platelets: 234 10*3/uL (ref 150–450)
RBC: 4.31 x10E6/uL (ref 3.77–5.28)
RDW: 13.2 % (ref 11.7–15.4)
WBC: 7 10*3/uL (ref 3.4–10.8)

## 2023-08-21 LAB — LIPID PANEL W/O CHOL/HDL RATIO
Cholesterol, Total: 146 mg/dL (ref 100–199)
HDL: 62 mg/dL (ref >39)
LDL Chol Calc (NIH): 65 mg/dL (ref 0–99)
Triglycerides: 103 mg/dL (ref 0–149)
VLDL Cholesterol Cal: 19 mg/dL (ref 5–40)

## 2023-08-21 LAB — CMP14+EGFR
ALT: 20 IU/L (ref 0–32)
AST: 26 IU/L (ref 0–40)
Albumin: 4.4 g/dL (ref 3.7–4.7)
Alkaline Phosphatase: 50 IU/L (ref 44–121)
BUN/Creatinine Ratio: 19 (ref 12–28)
BUN: 34 mg/dL — ABNORMAL HIGH (ref 8–27)
Bilirubin Total: 0.5 mg/dL (ref 0.0–1.2)
CO2: 22 mmol/L (ref 20–29)
Calcium: 10 mg/dL (ref 8.7–10.3)
Chloride: 103 mmol/L (ref 96–106)
Creatinine, Ser: 1.76 mg/dL — ABNORMAL HIGH (ref 0.57–1.00)
Globulin, Total: 2.2 g/dL (ref 1.5–4.5)
Glucose: 117 mg/dL — ABNORMAL HIGH (ref 70–99)
Potassium: 4.3 mmol/L (ref 3.5–5.2)
Sodium: 142 mmol/L (ref 134–144)
Total Protein: 6.6 g/dL (ref 6.0–8.5)
eGFR: 28 mL/min/{1.73_m2} — ABNORMAL LOW (ref >59)

## 2023-08-21 LAB — HEMOGLOBIN A1C
Est. average glucose Bld gHb Est-mCnc: 128 mg/dL
Hgb A1c MFr Bld: 6.1 % — ABNORMAL HIGH (ref 4.8–5.6)

## 2023-08-21 LAB — VITAMIN D 25 HYDROXY (VIT D DEFICIENCY, FRACTURES): Vit D, 25-Hydroxy: 52.8 ng/mL (ref 30.0–100.0)

## 2023-08-22 ENCOUNTER — Ambulatory Visit: Payer: Self-pay | Admitting: Internal Medicine

## 2023-08-22 ENCOUNTER — Encounter: Payer: Self-pay | Admitting: Internal Medicine

## 2023-08-22 ENCOUNTER — Ambulatory Visit: Admitting: Internal Medicine

## 2023-08-22 VITALS — BP 130/74 | HR 88 | Ht 62.0 in | Wt 146.0 lb

## 2023-08-22 DIAGNOSIS — M5134 Other intervertebral disc degeneration, thoracic region: Secondary | ICD-10-CM | POA: Diagnosis not present

## 2023-08-22 DIAGNOSIS — Z1389 Encounter for screening for other disorder: Secondary | ICD-10-CM | POA: Diagnosis not present

## 2023-08-22 DIAGNOSIS — E1142 Type 2 diabetes mellitus with diabetic polyneuropathy: Secondary | ICD-10-CM | POA: Diagnosis not present

## 2023-08-22 DIAGNOSIS — I10 Essential (primary) hypertension: Secondary | ICD-10-CM

## 2023-08-22 DIAGNOSIS — Z0000 Encounter for general adult medical examination without abnormal findings: Secondary | ICD-10-CM

## 2023-08-22 DIAGNOSIS — F411 Generalized anxiety disorder: Secondary | ICD-10-CM | POA: Diagnosis not present

## 2023-08-22 DIAGNOSIS — E782 Mixed hyperlipidemia: Secondary | ICD-10-CM

## 2023-08-22 DIAGNOSIS — Z0001 Encounter for general adult medical examination with abnormal findings: Secondary | ICD-10-CM | POA: Diagnosis not present

## 2023-08-22 DIAGNOSIS — I2510 Atherosclerotic heart disease of native coronary artery without angina pectoris: Secondary | ICD-10-CM | POA: Diagnosis not present

## 2023-08-22 LAB — POCT URINALYSIS DIPSTICK
Bilirubin, UA: NEGATIVE
Glucose, UA: NEGATIVE
Ketones, UA: NEGATIVE
Leukocytes, UA: NEGATIVE
Nitrite, UA: NEGATIVE
Protein, UA: POSITIVE — AB
Spec Grav, UA: 1.02 (ref 1.010–1.025)
Urobilinogen, UA: 0.2 U/dL
pH, UA: 6 (ref 5.0–8.0)

## 2023-08-22 LAB — POC CREATINE & ALBUMIN,URINE
Creatinine, POC: 300 mg/dL
Microalbumin Ur, POC: 150 mg/L

## 2023-08-22 MED ORDER — DULOXETINE HCL 30 MG PO CPEP: 30.0000 mg | ORAL_CAPSULE | Freq: Every day | ORAL | 6 refills | Status: DC
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
## 2023-08-23 ENCOUNTER — Encounter: Payer: Self-pay | Admitting: Internal Medicine

## 2023-08-29 DIAGNOSIS — M19242 Secondary osteoarthritis, left hand: Secondary | ICD-10-CM | POA: Diagnosis not present

## 2023-08-30 DIAGNOSIS — R29898 Other symptoms and signs involving the musculoskeletal system: Secondary | ICD-10-CM | POA: Diagnosis not present

## 2023-08-30 DIAGNOSIS — R269 Unspecified abnormalities of gait and mobility: Secondary | ICD-10-CM | POA: Diagnosis not present

## 2023-08-30 DIAGNOSIS — R2689 Other abnormalities of gait and mobility: Secondary | ICD-10-CM | POA: Diagnosis not present

## 2023-08-30 DIAGNOSIS — M546 Pain in thoracic spine: Secondary | ICD-10-CM | POA: Diagnosis not present

## 2023-08-30 DIAGNOSIS — G8929 Other chronic pain: Secondary | ICD-10-CM | POA: Diagnosis not present

## 2023-09-02 ENCOUNTER — Encounter: Payer: Self-pay | Admitting: Cardiovascular Disease

## 2023-09-03 ENCOUNTER — Ambulatory Visit: Admitting: Internal Medicine

## 2023-09-03 NOTE — Telephone Encounter (Signed)
 Patient has called twice asking for a response back to the [place] message that she sent

## 2023-09-10 DIAGNOSIS — M546 Pain in thoracic spine: Secondary | ICD-10-CM | POA: Diagnosis not present

## 2023-09-10 DIAGNOSIS — K869 Disease of pancreas, unspecified: Secondary | ICD-10-CM | POA: Diagnosis not present

## 2023-09-10 DIAGNOSIS — G8929 Other chronic pain: Secondary | ICD-10-CM | POA: Diagnosis not present

## 2023-09-10 DIAGNOSIS — R9089 Other abnormal findings on diagnostic imaging of central nervous system: Secondary | ICD-10-CM | POA: Diagnosis not present

## 2023-09-11 DIAGNOSIS — M75102 Unspecified rotator cuff tear or rupture of left shoulder, not specified as traumatic: Secondary | ICD-10-CM | POA: Diagnosis not present

## 2023-09-17 DIAGNOSIS — S46012A Strain of muscle(s) and tendon(s) of the rotator cuff of left shoulder, initial encounter: Secondary | ICD-10-CM | POA: Diagnosis not present

## 2023-09-17 DIAGNOSIS — S46112A Strain of muscle, fascia and tendon of long head of biceps, left arm, initial encounter: Secondary | ICD-10-CM | POA: Diagnosis not present

## 2023-09-17 DIAGNOSIS — M129 Arthropathy, unspecified: Secondary | ICD-10-CM | POA: Diagnosis not present

## 2023-09-17 DIAGNOSIS — M75102 Unspecified rotator cuff tear or rupture of left shoulder, not specified as traumatic: Secondary | ICD-10-CM | POA: Diagnosis not present

## 2023-09-19 DIAGNOSIS — M546 Pain in thoracic spine: Secondary | ICD-10-CM | POA: Diagnosis not present

## 2023-09-19 DIAGNOSIS — G8929 Other chronic pain: Secondary | ICD-10-CM | POA: Diagnosis not present

## 2023-09-20 DIAGNOSIS — M75122 Complete rotator cuff tear or rupture of left shoulder, not specified as traumatic: Secondary | ICD-10-CM | POA: Diagnosis not present

## 2023-09-20 DIAGNOSIS — M180 Bilateral primary osteoarthritis of first carpometacarpal joints: Secondary | ICD-10-CM | POA: Diagnosis not present

## 2023-09-30 ENCOUNTER — Ambulatory Visit: Admitting: Cardiovascular Disease

## 2023-10-01 DIAGNOSIS — D490 Neoplasm of unspecified behavior of digestive system: Secondary | ICD-10-CM | POA: Diagnosis not present

## 2023-10-02 DIAGNOSIS — M25512 Pain in left shoulder: Secondary | ICD-10-CM | POA: Diagnosis not present

## 2023-10-02 DIAGNOSIS — G8929 Other chronic pain: Secondary | ICD-10-CM | POA: Diagnosis not present

## 2023-10-03 DIAGNOSIS — Z993 Dependence on wheelchair: Secondary | ICD-10-CM | POA: Diagnosis not present

## 2023-10-03 DIAGNOSIS — E66811 Obesity, class 1: Secondary | ICD-10-CM | POA: Diagnosis not present

## 2023-10-03 DIAGNOSIS — E782 Mixed hyperlipidemia: Secondary | ICD-10-CM | POA: Diagnosis not present

## 2023-10-03 DIAGNOSIS — Z1389 Encounter for screening for other disorder: Secondary | ICD-10-CM | POA: Diagnosis not present

## 2023-10-03 DIAGNOSIS — I059 Rheumatic mitral valve disease, unspecified: Secondary | ICD-10-CM | POA: Diagnosis not present

## 2023-10-03 DIAGNOSIS — Z1339 Encounter for screening examination for other mental health and behavioral disorders: Secondary | ICD-10-CM | POA: Diagnosis not present

## 2023-10-03 DIAGNOSIS — F411 Generalized anxiety disorder: Secondary | ICD-10-CM | POA: Diagnosis not present

## 2023-10-03 DIAGNOSIS — F331 Major depressive disorder, recurrent, moderate: Secondary | ICD-10-CM | POA: Diagnosis not present

## 2023-10-03 DIAGNOSIS — Z7689 Persons encountering health services in other specified circumstances: Secondary | ICD-10-CM | POA: Diagnosis not present

## 2023-10-03 DIAGNOSIS — I5032 Chronic diastolic (congestive) heart failure: Secondary | ICD-10-CM | POA: Diagnosis not present

## 2023-10-03 DIAGNOSIS — N184 Chronic kidney disease, stage 4 (severe): Secondary | ICD-10-CM | POA: Diagnosis not present

## 2023-10-03 DIAGNOSIS — E6609 Other obesity due to excess calories: Secondary | ICD-10-CM | POA: Diagnosis not present

## 2023-10-03 DIAGNOSIS — R7303 Prediabetes: Secondary | ICD-10-CM | POA: Diagnosis not present

## 2023-10-03 DIAGNOSIS — I130 Hypertensive heart and chronic kidney disease with heart failure and stage 1 through stage 4 chronic kidney disease, or unspecified chronic kidney disease: Secondary | ICD-10-CM | POA: Diagnosis not present

## 2023-10-03 DIAGNOSIS — J4520 Mild intermittent asthma, uncomplicated: Secondary | ICD-10-CM | POA: Diagnosis not present

## 2023-10-17 ENCOUNTER — Ambulatory Visit: Admitting: Cardiovascular Disease

## 2023-10-17 ENCOUNTER — Encounter: Payer: Self-pay | Admitting: Cardiovascular Disease

## 2023-10-17 VITALS — BP 128/70 | HR 85 | Ht 62.0 in | Wt 147.8 lb

## 2023-10-17 DIAGNOSIS — I2510 Atherosclerotic heart disease of native coronary artery without angina pectoris: Secondary | ICD-10-CM | POA: Diagnosis not present

## 2023-10-17 DIAGNOSIS — I10 Essential (primary) hypertension: Secondary | ICD-10-CM

## 2023-10-17 DIAGNOSIS — I5033 Acute on chronic diastolic (congestive) heart failure: Secondary | ICD-10-CM | POA: Diagnosis not present

## 2023-10-17 DIAGNOSIS — E782 Mixed hyperlipidemia: Secondary | ICD-10-CM

## 2023-10-17 NOTE — Progress Notes (Signed)
 Cardiology Office Note   Date:  10/17/2023   ID:  Gail Paul, DOB 10/16/1933, MRN [id number]  PCP:  Fernand Fredy RAMAN, MD  Cardiologist:  Denyse Fernand, MD      History of Present Illness: Gail Paul is a 89 y.o. female who presents for  Chief Complaint  Patient presents with   Follow-up    2 month follow up    Had fall again. Feeling better off meds.      Past Medical History:  Diagnosis Date   Arthritis of carpometacarpal [facility]) joint of left thumb 07/11/2021   CHF (congestive heart failure) (HCC)    Heart attack (HCC)    Hypertension    Shortness of breath 12/22/2015   Note: Unchanged     Past Surgical History:  Procedure Laterality Date   BACK SURGERY     carpel tunnel surgery     CATARACT EXTRACTION     ROTATOR CUFF REPAIR Left      Current Outpatient Medications  Medication Sig Dispense Refill   clopidogrel (PLAVIX) 75 MG tablet Take 1 tablet (75 mg total) by mouth daily. 90 tablet 3   Multiple Vitamins-Minerals (CENTRUM SILVER 50+WOMEN PO) Take by mouth daily.     nitroGLYCERIN (NITROSTAT) 0.4 MG SL tablet Place 1 tablet (0.4 mg total) under the tongue every 5 (five) minutes as needed for chest pain. 100 tablet 3   rosuvastatin (CRESTOR) 40 MG tablet Take 1 tablet (40 mg total) by mouth daily. 90 tablet 3   triamcinolone (KENALOG) 0.025 % ointment Apply 1 Application topically 2 (two) times daily. 80 g 0   No current facility-administered medications for this visit.    Allergies:   Shellfish allergy, Fenofibrate, Meloxicam, and Tramadol    Social History:   reports that she has never smoked. She has never used smokeless tobacco. She reports that she does not drink alcohol and does not use drugs.   Family History:  family history includes Deep vein thrombosis in her mother; Emphysema in her father; Healthy in her brother; Hypertension in her father; Stroke in her mother.    ROS:     Review of Systems  Constitutional: Negative.   HENT:  Negative.    Eyes: Negative.   Respiratory: Negative.    Gastrointestinal: Negative.   Genitourinary: Negative.   Musculoskeletal: Negative.   Skin: Negative.   Neurological: Negative.   Endo/Heme/Allergies: Negative.   Psychiatric/Behavioral: Negative.    All other systems reviewed and are negative.     All other systems are reviewed and negative.    PHYSICAL EXAM: VS:  BP 128/70   Pulse 85   Ht 5' 2 (1.575 m)   Wt 147 lb 12.8 oz (67 kg)   SpO2 97%   BMI 27.03 kg/m  , BMI Body mass index is 27.03 kg/m. Last weight:  Wt Readings from Last 3 Encounters:  10/17/23 147 lb 12.8 oz (67 kg)  08/22/23 146 lb (66.2 kg)  08/06/23 149 lb 12.8 oz (67.9 kg)     Physical Exam Constitutional:      Appearance: Normal appearance.  Cardiovascular:     Rate and Rhythm: Normal rate and regular rhythm.     Heart sounds: Normal heart sounds.  Pulmonary:     Effort: Pulmonary effort is normal.     Breath sounds: Normal breath sounds.  Musculoskeletal:     Right lower leg: No edema.     Left lower leg: No edema.  Neurological:  Mental Status: She is alert.       EKG:   Recent Labs: 02/07/2023: TSH 3.500 08/20/2023: ALT 20; BUN 34; Creatinine, Ser 1.76; Hemoglobin 13.4; Platelets 234; Potassium 4.3; Sodium 142    Lipid Panel    Component Value Date/Time   CHOL 146 08/20/2023 1006   CHOL 205 (H) 12/21/2012 1855   TRIG 103 08/20/2023 1006   TRIG 167 12/21/2012 1855   HDL 62 08/20/2023 1006   HDL 44 12/21/2012 1855   CHOLHDL 4.3 11/14/2022 1057   CHOLHDL 7.7 09/27/2022 0340   VLDL 39 09/27/2022 0340   VLDL 33 12/21/2012 1855   LDLCALC 65 08/20/2023 1006   LDLCALC 128 (H) 12/21/2012 1855      Other studies Reviewed: Additional studies/ records that were reviewed today include:  Review of the above records demonstrates:       No data to display            ASSESSMENT AND PLAN:    ICD-10-CM   1. CHF (congestive heart failure), NYHA class III, acute on  chronic, diastolic (HCC)  I50.33    feeling better on coreg    2. Mixed hyperlipidemia  E78.2     3. Essential hypertension  I10    stable    4. Coronary artery disease involving native coronary artery of native heart without angina pectoris  I25.10        Problem List Items Addressed This Visit       Cardiovascular and Mediastinum   CAD (coronary artery disease) (Chronic)   Essential hypertension (Chronic)     Other   Hyperlipidemia   Other Visit Diagnoses       CHF (congestive heart failure), NYHA class III, acute on chronic, diastolic (HCC)    -  Primary   feeling better on coreg          Disposition:   Return in about 3 months (around 01/17/2024).    Total time spent: 30 minutes  Signed,  Denyse Bathe, MD  10/17/2023 3:24 PM    [facility]

## 2023-11-11 ENCOUNTER — Other Ambulatory Visit: Payer: Self-pay

## 2023-11-11 ENCOUNTER — Inpatient Hospital Stay
Admission: EM | Admit: 2023-11-11 | Discharge: 2023-11-14 | DRG: 917 | Disposition: A | Discharge status: Home Health | Attending: Obstetrics and Gynecology | Admitting: Obstetrics and Gynecology

## 2023-11-11 ENCOUNTER — Emergency Department

## 2023-11-11 DIAGNOSIS — E11649 Type 2 diabetes mellitus with hypoglycemia without coma: Secondary | ICD-10-CM | POA: Diagnosis present

## 2023-11-11 DIAGNOSIS — M5030 Other cervical disc degeneration, unspecified cervical region: Secondary | ICD-10-CM | POA: Diagnosis present

## 2023-11-11 DIAGNOSIS — N182 Chronic kidney disease, stage 2 (mild): Secondary | ICD-10-CM | POA: Diagnosis present

## 2023-11-11 DIAGNOSIS — I739 Peripheral vascular disease, unspecified: Secondary | ICD-10-CM | POA: Diagnosis present

## 2023-11-11 DIAGNOSIS — Z7902 Long term (current) use of antithrombotics/antiplatelets: Secondary | ICD-10-CM

## 2023-11-11 DIAGNOSIS — R410 Disorientation, unspecified: Secondary | ICD-10-CM

## 2023-11-11 DIAGNOSIS — G9341 Metabolic encephalopathy: Secondary | ICD-10-CM | POA: Diagnosis not present

## 2023-11-11 DIAGNOSIS — E16A1 Hypoglycemia level 1: Secondary | ICD-10-CM | POA: Diagnosis present

## 2023-11-11 DIAGNOSIS — I10 Essential (primary) hypertension: Secondary | ICD-10-CM | POA: Diagnosis present

## 2023-11-11 DIAGNOSIS — R400 Somnolence: Principal | ICD-10-CM

## 2023-11-11 DIAGNOSIS — E66811 Obesity, class 1: Secondary | ICD-10-CM | POA: Diagnosis present

## 2023-11-11 DIAGNOSIS — Z91013 Allergy to seafood: Secondary | ICD-10-CM

## 2023-11-11 DIAGNOSIS — E669 Obesity, unspecified: Secondary | ICD-10-CM | POA: Diagnosis present

## 2023-11-11 DIAGNOSIS — I509 Heart failure, unspecified: Secondary | ICD-10-CM

## 2023-11-11 DIAGNOSIS — F32A Depression, unspecified: Secondary | ICD-10-CM | POA: Diagnosis present

## 2023-11-11 DIAGNOSIS — F411 Generalized anxiety disorder: Secondary | ICD-10-CM | POA: Diagnosis present

## 2023-11-11 DIAGNOSIS — J4520 Mild intermittent asthma, uncomplicated: Secondary | ICD-10-CM | POA: Diagnosis present

## 2023-11-11 DIAGNOSIS — Z79899 Other long term (current) drug therapy: Secondary | ICD-10-CM

## 2023-11-11 DIAGNOSIS — Z886 Allergy status to analgesic agent status: Secondary | ICD-10-CM

## 2023-11-11 DIAGNOSIS — E1122 Type 2 diabetes mellitus with diabetic chronic kidney disease: Secondary | ICD-10-CM | POA: Diagnosis present

## 2023-11-11 DIAGNOSIS — T40711A Poisoning by cannabis, accidental (unintentional), initial encounter: Principal | ICD-10-CM | POA: Diagnosis present

## 2023-11-11 DIAGNOSIS — I2510 Atherosclerotic heart disease of native coronary artery without angina pectoris: Secondary | ICD-10-CM | POA: Diagnosis present

## 2023-11-11 DIAGNOSIS — E785 Hyperlipidemia, unspecified: Secondary | ICD-10-CM | POA: Diagnosis present

## 2023-11-11 DIAGNOSIS — Z6830 Body mass index (BMI) 30.0-30.9, adult: Secondary | ICD-10-CM

## 2023-11-11 DIAGNOSIS — Z885 Allergy status to narcotic agent status: Secondary | ICD-10-CM

## 2023-11-11 DIAGNOSIS — G8929 Other chronic pain: Secondary | ICD-10-CM | POA: Diagnosis present

## 2023-11-11 DIAGNOSIS — I130 Hypertensive heart and chronic kidney disease with heart failure and stage 1 through stage 4 chronic kidney disease, or unspecified chronic kidney disease: Secondary | ICD-10-CM | POA: Diagnosis present

## 2023-11-11 DIAGNOSIS — G928 Other toxic encephalopathy: Secondary | ICD-10-CM | POA: Diagnosis present

## 2023-11-11 DIAGNOSIS — I5032 Chronic diastolic (congestive) heart failure: Secondary | ICD-10-CM | POA: Diagnosis present

## 2023-11-11 DIAGNOSIS — I252 Old myocardial infarction: Secondary | ICD-10-CM

## 2023-11-11 DIAGNOSIS — Z825 Family history of asthma and other chronic lower respiratory diseases: Secondary | ICD-10-CM

## 2023-11-11 DIAGNOSIS — Z823 Family history of stroke: Secondary | ICD-10-CM

## 2023-11-11 DIAGNOSIS — Z888 Allergy status to other drugs, medicaments and biological substances status: Secondary | ICD-10-CM

## 2023-11-11 DIAGNOSIS — E1151 Type 2 diabetes mellitus with diabetic peripheral angiopathy without gangrene: Secondary | ICD-10-CM | POA: Diagnosis present

## 2023-11-11 DIAGNOSIS — E1142 Type 2 diabetes mellitus with diabetic polyneuropathy: Secondary | ICD-10-CM | POA: Diagnosis present

## 2023-11-11 DIAGNOSIS — Z8249 Family history of ischemic heart disease and other diseases of the circulatory system: Secondary | ICD-10-CM

## 2023-11-11 LAB — COMPREHENSIVE METABOLIC PANEL WITH GFR
ALT: 15 U/L (ref 0–44)
AST: 23 U/L (ref 15–41)
Albumin: 4 g/dL (ref 3.5–5.0)
Alkaline Phosphatase: 40 U/L (ref 38–126)
Anion gap: 9 (ref 5–15)
BUN: 24 mg/dL — ABNORMAL HIGH (ref 8–23)
CO2: 26 mmol/L (ref 22–32)
Calcium: 9.3 mg/dL (ref 8.9–10.3)
Chloride: 104 mmol/L (ref 98–111)
Creatinine, Ser: 1.25 mg/dL — ABNORMAL HIGH (ref 0.44–1.00)
GFR, Estimated: 41 mL/min — ABNORMAL LOW (ref >60)
Glucose, Bld: 112 mg/dL — ABNORMAL HIGH (ref 70–99)
Potassium: 4.1 mmol/L (ref 3.5–5.1)
Sodium: 139 mmol/L (ref 135–145)
Total Bilirubin: 0.8 mg/dL (ref 0.0–1.2)
Total Protein: 6.9 g/dL (ref 6.5–8.1)

## 2023-11-11 LAB — CBC WITH DIFFERENTIAL/PLATELET
Abs Immature Granulocytes: 0.02 K/uL (ref 0.00–0.07)
Basophils Absolute: 0.1 K/uL (ref 0.0–0.1)
Basophils Relative: 1 %
Eosinophils Absolute: 0.2 K/uL (ref 0.0–0.5)
Eosinophils Relative: 3 %
HCT: 38.8 % (ref 36.0–46.0)
Hemoglobin: 12.4 g/dL (ref 12.0–15.0)
Immature Granulocytes: 0 %
Lymphocytes Relative: 31 %
Lymphs Abs: 2.5 K/uL (ref 0.7–4.0)
MCH: 30.8 pg (ref 26.0–34.0)
MCHC: 32 g/dL (ref 30.0–36.0)
MCV: 96.3 fL (ref 80.0–100.0)
Monocytes Absolute: 0.5 K/uL (ref 0.1–1.0)
Monocytes Relative: 6 %
Neutro Abs: 4.7 K/uL (ref 1.7–7.7)
Neutrophils Relative %: 59 %
Platelets: 248 K/uL (ref 150–400)
RBC: 4.03 MIL/uL (ref 3.87–5.11)
RDW: 13.3 % (ref 11.5–15.5)
WBC: 8 K/uL (ref 4.0–10.5)
nRBC: 0 % (ref 0.0–0.2)

## 2023-11-11 LAB — PROTIME-INR
INR: 1.1 (ref 0.8–1.2)
Prothrombin Time: 15 s (ref 11.4–15.2)

## 2023-11-11 LAB — CBG MONITORING, ED
Glucose-Capillary: 94 mg/dL (ref 70–99)
Glucose-Capillary: 97 mg/dL (ref 70–99)

## 2023-11-11 LAB — LACTIC ACID, PLASMA: Lactic Acid, Venous: 1.4 mmol/L (ref 0.5–1.9)

## 2023-11-11 LAB — BLOOD GAS, VENOUS

## 2023-11-11 MED ORDER — ENOXAPARIN SODIUM 40 MG/0.4ML IJ SOSY
40.0000 mg | PREFILLED_SYRINGE | INTRAMUSCULAR | Status: DC
  Administered 2023-11-11 – 2023-11-13 (×2): 40 mg via SUBCUTANEOUS
  Filled 2023-11-11 (×3): qty 0.4

## 2023-11-11 MED ORDER — LACTATED RINGERS IV SOLN: INTRAVENOUS | Status: AC

## 2023-11-11 MED ORDER — ONDANSETRON HCL 4 MG PO TABS: 4.0000 mg | ORAL_TABLET | Freq: Four times a day (QID) | ORAL | Status: DC | PRN

## 2023-11-11 MED ORDER — SODIUM CHLORIDE 0.9 % IV BOLUS
1000.0000 mL | Freq: Once | INTRAVENOUS | Status: AC
  Administered 2023-11-11: 1000 mL via INTRAVENOUS

## 2023-11-11 MED ORDER — INSULIN ASPART 100 UNIT/ML IJ SOLN: 0.0000 [IU] | Freq: Three times a day (TID) | INTRAMUSCULAR | Status: DC

## 2023-11-11 MED ORDER — ONDANSETRON HCL 4 MG/2ML IJ SOLN: 4.0000 mg | Freq: Four times a day (QID) | INTRAMUSCULAR | Status: DC | PRN

## 2023-11-11 MED ORDER — INSULIN ASPART 100 UNIT/ML IJ SOLN
0.0000 [IU] | Freq: Every day | INTRAMUSCULAR | Status: DC
  Administered 2023-11-11: 0 [IU] via SUBCUTANEOUS

## 2023-11-11 NOTE — ED Provider Notes (Signed)
 [facility] Provider Note    Event Date/Time   First MD Initiated Contact with Patient 11/11/23 1756     (approximate)   History   Altered Mental Status  EM caveat: Altered mental status.  History is provided by the patient's friend who advised she lives with her and she reports the patient does not have any family  HPI  Gail Paul is a 89 y.o. female history of diabetes hypertension chronic left shoulder pain, congestive heart failure   Patient's friend provides history.  Reports yesterday patient was normal up walking around and went out for dinner they got home around 7 PM.  Patient has chronic shoulder pain, she asked her friend to give her some CBD tablets.  Patient's friend open the tablets, and gave them to her.  She went to bed at about 9 PM, and friend advises she thought maybe she was tired from the activities a day before but when the patient not get out of bed by 4 PM she became concerned and found that she just seemed confused until about 5 PM when she called EMS.  She notes that she just seems like she cannot keep awake, she seems very tired.  The patient not been a complain of anything but is just been very confused  Last seen normal roughly 8 or 9 PM yesterday when she went to bed.  Physical Exam   Triage Vital Signs: ED Triage Vitals  Encounter Vitals Group     BP 11/11/23 1732 (!) 178/76     Girls Systolic BP Percentile --      Girls Diastolic BP Percentile --      Boys Systolic BP Percentile --      Boys Diastolic BP Percentile --      Pulse Rate 11/11/23 1732 91     Resp 11/11/23 1732 16     Temp 11/11/23 1736 97.8 F (36.6 C)     Temp src --      SpO2 11/11/23 1732 93 %     Weight 11/11/23 1736 167 lb 6.4 oz (75.9 kg)     Height --      Head Circumference --      Peak Flow --      Pain Score --      Pain Loc --      Pain Education --      Exclude from Growth Chart --     Most recent vital signs: Vitals:   11/11/23  1736 11/11/23 1800  BP:  118/60  Pulse:  (!) 58  Resp:  11  Temp: 97.8 F (36.6 C)   SpO2:  100%     General: Somnolent.  Normal vital signs.  Alerts to voice and stimulation but maintains eye contact perhaps 2 to 3 seconds, only able to maintain eye contact with constant talking and stimulation.  Gaze is conjugate.  Pupils equal round reactive no pinpoint.  Patient goes back to sleeping or resting when not actively being touched or communicated with.  She seems very somnolent CV:  Good peripheral perfusion.  Normal tones and rate Resp:  Normal effort.  Clear bilateral Abd:  No distention.  Soft nontender nondistended Other:  Fatigued but moves all extremities to command.  Able to grip to command both arms, wiggle toes in both legs and lift both legs just slightly off the bed.  Her mental status is slightly dipped to moderately depressed.  Normocephalic atraumatic.  Friend at the bedside  advises patient went to bed there were no falls or injuries, and the patient remained in the bed since about 8 PM last night   ED Results / Procedures / Treatments   Labs (all labs ordered are listed, but only abnormal results are displayed) Labs Reviewed  BLOOD GAS, VENOUS - Abnormal; Notable for the following components:      Result Value   pCO2, Ven 63 (*)    Bicarbonate 28.3 (*)    All other components within normal limits  COMPREHENSIVE METABOLIC PANEL WITH GFR - Abnormal; Notable for the following components:   Glucose, Bld 112 (*)    BUN 24 (*)    Creatinine, Ser 1.25 (*)    GFR, Estimated 41 (*)    All other components within normal limits  CULTURE, BLOOD (ROUTINE X 2)  CULTURE, BLOOD (ROUTINE X 2)  LACTIC ACID, PLASMA  CBC WITH DIFFERENTIAL/PLATELET  PROTIME-INR  LACTIC ACID, PLASMA  URINALYSIS, W/ REFLEX TO CULTURE (INFECTION SUSPECTED)  URINE DRUG SCREEN, QUALITATIVE ([facility] ONLY)  CBG MONITORING, ED   Labs interpreted me as essentially normal metabolic panel with exception of very  minimally elevated creatinine, appears improved from previous check though.  CBC normal  pH normal slightly elevated pCO2.  Patient with clear lungs no respiratory distress respirating comfortably.  Continue to monitor closely  EKG  Interpreted by me at 1735 heart rate 80 QRS 90 QTc 480 Normal sinus rhythm no evidence of acute ischemia   RADIOLOGY  CT Head Wo Contrast Result Date: 11/11/2023 CLINICAL DATA:  Slurred speech, altered level of consciousness EXAM: CT HEAD WITHOUT CONTRAST TECHNIQUE: Contiguous axial images were obtained from the base of the skull through the vertex without intravenous contrast. RADIATION DOSE REDUCTION: This exam was performed according to the departmental dose-optimization program which includes automated exposure control, adjustment of the mA and/or kV according to patient size and/or use of iterative reconstruction technique. COMPARISON:  None Available. FINDINGS: Brain: No acute infarct or hemorrhage. Lateral ventricles and midline structures are unremarkable. No acute extra-axial fluid collections. No mass effect. Vascular: No hyperdense vessel or unexpected calcification. Skull: Normal. Negative for fracture or focal lesion. Sinuses/Orbits: No acute finding. Other: None. IMPRESSION: Stable head CT, no acute intracranial process. Electronically Signed   By: Ozell Daring M.D.   On: 11/11/2023 18:49   DG Chest Port 1 View Result Date: 11/11/2023 CLINICAL DATA:  Questionable sepsis EXAM: PORTABLE CHEST 1 VIEW COMPARISON:  09/26/2022 FINDINGS: Single frontal view of the chest demonstrates a stable cardiac silhouette. No airspace disease, effusion, or pneumothorax. Stable degenerative changes of the right shoulder. No acute bony abnormalities. IMPRESSION: 1. No acute intrathoracic process. Electronically Signed   By: Ozell Daring M.D.   On: 11/11/2023 18:24      PROCEDURES:  Critical Care performed: Yes, see critical care procedure  note(s)  Procedures   MEDICATIONS ORDERED IN ED: Medications  sodium chloride  0.9 % bolus 1,000 mL (1,000 mLs Intravenous New Bag/Given 11/11/23 2054)     IMPRESSION / MDM / ASSESSMENT AND PLAN / ED COURSE  I reviewed the triage vital signs and the nursing notes.                              Differential diagnosis includes, but is not limited to, acute metabolic abnormalities, infectious causes encephalopathy, less likely felt to be a central neurologic cause, possible related to CBC or THC like compound etc.  History obtained from  friend is at the patient has a history of chronic shoulder pain and asked her friend to open a new package of CBD.  That occurred not long before the patient went to bed, and she was found with somnolence at 4 PM today.  It is unclear to me if that is the cause and broad workup is certainly needed.  Vital signs reassuring.  Awaiting labs glucose CT imaging of the head chest x-ray etc.  Patient's presentation is most consistent with acute presentation with potential threat to life or bodily function.   Patient's alertness level slowly improving.  Still with some confusion but somnolence is slowly improving.  Patient now able to converse.  Admitted to hospitalist service under the care of Dr. Sim  Some further workup ordered in the ER is pending clued urinalysis.  Attempted In-N-Out yielded no return earlier.       FINAL CLINICAL IMPRESSION(S) / ED DIAGNOSES   Final diagnoses:  Somnolence  Disorientation     Rx / DC Orders   ED Discharge Orders     None        Note:  This document was prepared using Dragon voice recognition software and may include unintentional dictation errors.   Dicky Anes, MD 11/11/23 2106

## 2023-11-11 NOTE — H&P (Signed)
 History and Physical    Patient: Gail Paul FMW:[id number] DOB: 1933/12/08 DOA: 11/11/2023 DOS: the patient was seen and examined on 11/11/2023 PCP: Fernand Fredy RAMAN, MD  Patient coming from: Home  Chief Complaint:  Chief Complaint  Patient presents with   Altered Mental Status   HPI: Gail Paul is a 89 y.o. female with medical history significant of essential hypertension, diastolic/CHF, osteoarthritis, diet-controlled diabetes, chronic kidney disease stage II, coronary artery disease, peripheral arterial disease, morbid obesity, generalized anxiety disorder, hyperlipidemia, Mild intermittent asthma, depression with anxiety who presents to the ER with altered mental status.  Patient apparently was doing fine until yesterday when she had full dinner.  Thereafter she received some CBD tablets.  Her friend who is a history giver lives with her.  She reported that since the patient took those tablets she was sleeping for the most part today.  She is drowsy now mostly unresponsive.  Brought into the ER like that.  In the ER so far patient has started responding on able to answer some questions but mostly drowsy and out of it.  At this point she has been admitted with acute metabolic encephalopathy.  Suspicion is that her symptoms are drug related.  Will admit her for observation and supportive care  Review of Systems: As mentioned in the history of present illness. All other systems reviewed and are negative. Past Medical History:  Diagnosis Date   Arthritis of carpometacarpal [facility]) joint of left thumb 07/11/2021   CHF (congestive heart failure) (HCC)    Heart attack (HCC)    Hypertension    Shortness of breath 12/22/2015   Note: Unchanged   Past Surgical History:  Procedure Laterality Date   BACK SURGERY     carpel tunnel surgery     CATARACT EXTRACTION     ROTATOR CUFF REPAIR Left    Social History:  reports that she has never smoked. She has never used smokeless tobacco. She reports  that she does not drink alcohol and does not use drugs.  Allergies  Allergen Reactions   Shellfish Allergy  Swelling   Fenofibrate    Meloxicam     Tramadol      Family History  Problem Relation Age of Onset   Deep vein thrombosis Mother    Stroke Mother    Hypertension Father    Emphysema Father    Healthy Brother     Prior to Admission medications   Medication Sig Start Date End Date Taking? Authorizing Provider  clopidogrel  (PLAVIX ) 75 MG tablet Take 1 tablet (75 mg total) by mouth daily. 06/04/23   Fernand Fredy RAMAN, MD  Multiple Vitamins-Minerals (CENTRUM SILVER 50+WOMEN PO) Take by mouth daily.    [provider]  nitroGLYCERIN  (NITROSTAT ) 0.4 MG SL tablet Place 1 tablet (0.4 mg total) under the tongue every 5 (five) minutes as needed for chest pain. 07/08/23 07/07/24  Fernand Denyse LABOR, MD  rosuvastatin  (CRESTOR ) 40 MG tablet Take 1 tablet (40 mg total) by mouth daily. 06/06/23   Fernand Denyse LABOR, MD  triamcinolone  (KENALOG ) 0.025 % ointment Apply 1 Application topically 2 (two) times daily. 06/04/23   Fernand Fredy RAMAN, MD    Physical Exam: Vitals:   11/11/23 1732 11/11/23 1736 11/11/23 1800  BP: (!) 178/76  118/60  Pulse: 91  (!) 58  Resp: 16  11  Temp:  97.8 F (36.6 C)   SpO2: 93%  100%  Weight:  75.9 kg    Constitutional: Acutely ill looking, confused, NAD, calm,  comfortable Eyes: PERRL, lids and conjunctivae normal ENMT: Mucous membranes are moist. Posterior pharynx clear of any exudate or lesions.Normal dentition.  Neck: normal, supple, no masses, no thyromegaly Respiratory: clear to auscultation bilaterally, no wheezing, no crackles. Normal respiratory effort. No accessory muscle use.  Cardiovascular: Regular rate and rhythm, no murmurs / rubs / gallops. No extremity edema. 2+ pedal pulses. No carotid bruits.  Abdomen: no tenderness, no masses palpated. No hepatosplenomegaly. Bowel sounds positive.  Musculoskeletal: Good range of motion, no joint swelling or  tenderness, Skin: no rashes, lesions, ulcers. No induration Neurologic: CN 2-12 grossly intact. Sensation intact, DTR normal. Strength 5/5 in all 4.  Psychiatric: Confused, drowsy but arousable  Data Reviewed:  Pulse is 58, blood pressure 138/76,glucose 112 BUN 24 creatinine 1.25.  CBC within normal.  Chest x-ray showed no acute findings.  Head CT without contrast showed no acute findings  Assessment and Plan:  #1 acute metabolic encephalopathy: Probably secondary to medications.  Patient was given what appears to be marijuana yesterday.  We will admit the patient for observation.  Monitor her overnight.  Mental status already beginning to improve.  If she wakes up and back to baseline we will discharge her home otherwise we may require MRI of the brain and EEG.  #2 essential hypertension: Continue with both medications.  #3 coronary artery disease: Stable at baseline.  Continue home regimen.  #4 hyperlipidemia: Continue statin.  #5 peripheral arterial disease: Continue home regimen  #6 type 2 diabetes: Will initiate sliding scale insulin   #7 CKD type II: Continue to monitor renal function  #8 diastolic CHF: Appears compensated.  We will continue home regimen.    Advance Care Planning:   Code Status: Prior full code  Consults: None  Family Communication: Friend at bedside.  Patient apparently has no family  Severity of Illness: The appropriate patient status for this patient is OBSERVATION. Observation status is judged to be reasonable and necessary in order to provide the required intensity of service to ensure the patient's safety. The patient's presenting symptoms, physical exam findings, and initial radiographic and laboratory data in the context of their medical condition is felt to place them at decreased risk for further clinical deterioration. Furthermore, it is anticipated that the patient will be medically stable for discharge from the hospital within 2 midnights of  admission.   AuthorBETHA SIM KNOLL, MD 11/11/2023 9:04 PM  For on call review [email or web address].

## 2023-11-11 NOTE — ED Notes (Signed)
 Attempted in and out cath with no success; Dr. Dicky made aware.

## 2023-11-11 NOTE — ED Triage Notes (Signed)
 Pt BIB EMS with c/o slurred speech and AMS. LSN 21:00 yesterday night. Pt did take THC at some point. Per EMS, pt has been dry heaving and spitting up. Pt reports dizziness and nausea.   175/86 CBG 78 4mg  zofran 

## 2023-11-12 ENCOUNTER — Encounter: Payer: Self-pay | Admitting: Internal Medicine

## 2023-11-12 DIAGNOSIS — G9341 Metabolic encephalopathy: Secondary | ICD-10-CM | POA: Diagnosis not present

## 2023-11-12 LAB — COMPREHENSIVE METABOLIC PANEL WITH GFR
ALT: 14 U/L (ref 0–44)
AST: 21 U/L (ref 15–41)
Albumin: 3.4 g/dL — ABNORMAL LOW (ref 3.5–5.0)
Alkaline Phosphatase: 36 U/L — ABNORMAL LOW (ref 38–126)
Anion gap: 8 (ref 5–15)
BUN: 22 mg/dL (ref 8–23)
CO2: 26 mmol/L (ref 22–32)
Calcium: 8.4 mg/dL — ABNORMAL LOW (ref 8.9–10.3)
Chloride: 107 mmol/L (ref 98–111)
Creatinine, Ser: 1.09 mg/dL — ABNORMAL HIGH (ref 0.44–1.00)
GFR, Estimated: 49 mL/min — ABNORMAL LOW (ref >60)
Glucose, Bld: 98 mg/dL (ref 70–99)
Potassium: 4.4 mmol/L (ref 3.5–5.1)
Sodium: 141 mmol/L (ref 135–145)
Total Bilirubin: 0.6 mg/dL (ref 0.0–1.2)
Total Protein: 6.1 g/dL — ABNORMAL LOW (ref 6.5–8.1)

## 2023-11-12 LAB — GLUCOSE, CAPILLARY
Glucose-Capillary: 60 mg/dL — ABNORMAL LOW (ref 70–99)
Glucose-Capillary: 143 mg/dL — ABNORMAL HIGH (ref 70–99)
Glucose-Capillary: 67 mg/dL — ABNORMAL LOW (ref 70–99)

## 2023-11-12 LAB — URINALYSIS, W/ REFLEX TO CULTURE (INFECTION SUSPECTED)
Bacteria, UA: NONE SEEN
Bilirubin Urine: NEGATIVE
Glucose, UA: NEGATIVE mg/dL
Hgb urine dipstick: NEGATIVE
Ketones, ur: NEGATIVE mg/dL
Leukocytes,Ua: NEGATIVE
Nitrite: NEGATIVE
Protein, ur: NEGATIVE mg/dL
RBC / HPF: 0 RBC/hpf (ref 0–5)
Specific Gravity, Urine: 1.017 (ref 1.005–1.030)
Squamous Epithelial / HPF: 0 /HPF (ref 0–5)
pH: 6 (ref 5.0–8.0)

## 2023-11-12 LAB — URINE DRUG SCREEN, QUALITATIVE (ARMC ONLY)
Amphetamines, Ur Screen: NOT DETECTED
Barbiturates, Ur Screen: NOT DETECTED
Benzodiazepine, Ur Scrn: NOT DETECTED
Cannabinoid 50 Ng, Ur ~~LOC~~: NOT DETECTED
Cocaine Metabolite,Ur ~~LOC~~: NOT DETECTED
MDMA (Ecstasy)Ur Screen: NOT DETECTED
Methadone Scn, Ur: NOT DETECTED
Opiate, Ur Screen: NOT DETECTED
Phencyclidine (PCP) Ur S: NOT DETECTED
Tricyclic, Ur Screen: NOT DETECTED

## 2023-11-12 LAB — BLOOD GAS, VENOUS

## 2023-11-12 LAB — CBC
HCT: 34 % — ABNORMAL LOW (ref 36.0–46.0)
Hemoglobin: 10.9 g/dL — ABNORMAL LOW (ref 12.0–15.0)
MCH: 30.9 pg (ref 26.0–34.0)
MCHC: 32.1 g/dL (ref 30.0–36.0)
MCV: 96.3 fL (ref 80.0–100.0)
Platelets: 206 K/uL (ref 150–400)
RBC: 3.53 MIL/uL — ABNORMAL LOW (ref 3.87–5.11)
RDW: 13.2 % (ref 11.5–15.5)
WBC: 8.4 K/uL (ref 4.0–10.5)
nRBC: 0 % (ref 0.0–0.2)

## 2023-11-12 LAB — AMMONIA: Ammonia: 13 umol/L (ref 9–35)

## 2023-11-12 LAB — CBG MONITORING, ED
Glucose-Capillary: 79 mg/dL (ref 70–99)
Glucose-Capillary: 83 mg/dL (ref 70–99)
Glucose-Capillary: 91 mg/dL (ref 70–99)

## 2023-11-12 LAB — LACTIC ACID, PLASMA: Lactic Acid, Venous: 1.3 mmol/L (ref 0.5–1.9)

## 2023-11-12 MED ORDER — DEXTROSE 50 % IV SOLN
INTRAVENOUS | Status: AC
  Administered 2023-11-12: 25 mL
  Filled 2023-11-12: qty 50

## 2023-11-12 MED ORDER — CLOPIDOGREL BISULFATE 75 MG PO TABS
75.0000 mg | ORAL_TABLET | Freq: Every day | ORAL | Status: DC
  Administered 2023-11-12 – 2023-11-14 (×2): 75 mg via ORAL
  Filled 2023-11-12 (×2): qty 1

## 2023-11-12 MED ORDER — DICLOFENAC SODIUM 1 % EX GEL
4.0000 g | Freq: Four times a day (QID) | CUTANEOUS | Status: DC
  Administered 2023-11-12 – 2023-11-14 (×6): 4 g via TOPICAL
  Filled 2023-11-12: qty 100

## 2023-11-12 MED ORDER — ACETAMINOPHEN 325 MG PO TABS
650.0000 mg | ORAL_TABLET | Freq: Four times a day (QID) | ORAL | Status: DC | PRN
  Administered 2023-11-13: 650 mg via ORAL
  Filled 2023-11-12 (×2): qty 2

## 2023-11-12 NOTE — Care Management Obs Status (Signed)
 MEDICARE OBSERVATION STATUS NOTIFICATION   Patient Details  Name: Gail Paul MRN: [id number] Date of Birth: [birth year]   Medicare Observation Status Notification Given:  Yes    Claribel Sachs W, CMA 11/12/2023, 3:38 PM

## 2023-11-12 NOTE — ED Notes (Signed)
 Dr. Lawence aware of pt's rectal temp being 95.66f. Bear hugger applied to pt

## 2023-11-12 NOTE — Evaluation (Signed)
 Physical Therapy Evaluation Patient Details Name: Gail Paul MRN: [id number] DOB: June 13, 1933 Today's Date: 11/12/2023  History of Present Illness  presented to ER secondary to AMS, increased lethargy; admitted for management of acute metabolic encephalopathy (suspected secondary to medication-related)  Clinical Impression  Patient resting in bed upon arrival to room, generally angled across bed, disheveled in appearance.  R UE IV noted to have been dislodged; RN informed/aware and to bedside for inspection.  Patient maintains eyes closed throughout session (despite verbal cuing, environmental stimuli for alertness), but verbally responsive and interactive with therapist throughout session.  Oriented to self, month and year; believes self to be at home.  Inconsistently follows simple commands, requiring frequent redirection to task at hand. Generally weak and deconditioned throughout all extremities; limited ROM to bilat shoulders (due to chronic pain, RTC injury?).  Frequently yells out in pain (appears to be related to shoulders, FACES 6/10), but unable to fully articulate symptoms. Currently requiring mod/max assist for bed mobility; min assist for unsupported sitting balance; min/mod assist +2 for sit/stand, standing balance and lateral stepping (x3') with RW at edge of bed.  Demonstrates short, shuffling steps along edge of bed; max assist to position and direct RW. Delayed balance reactions, all directions; constant manual assist to prevent LOB. Unsafe to attempt without +1 at all times.  Will continue to progress gait distance and OOB activities as alertness and awareness improves. Would benefit from skilled PT to address above deficits and promote optimal return to PLOF.; recommend post-acute PT follow up as indicated by interdisciplinary care team.            If plan is discharge home, recommend the following: Two people to help with walking and/or transfers;Two people to help with  bathing/dressing/bathroom   Can travel by private vehicle        Equipment Recommendations    Recommendations for Other Services       Functional Status Assessment Patient has had a recent decline in their functional status and demonstrates the ability to make significant improvements in function in a reasonable and predictable amount of time.     Precautions / Restrictions Precautions Precautions: Fall Restrictions Weight Bearing Restrictions Per Provider Order: No      Mobility  Bed Mobility Overal bed mobility: Needs Assistance Bed Mobility: Supine to Sit, Sit to Supine     Supine to sit: Mod assist, Max assist Sit to supine: Mod assist, Max assist        Transfers Overall transfer level: Needs assistance Equipment used: Rolling walker (2 wheels) Transfers: Sit to/from Stand Sit to Stand: Min assist, Mod assist, +2 physical assistance                Ambulation/Gait Ambulation/Gait assistance: Min assist, Mod assist, +2 physical assistance Gait Distance (Feet): 3 Feet (lateral stepping edge of bed) Assistive device: Rolling walker (2 wheels)         General Gait Details: short, shuffling steps along edge of bed; max assist to position and direct RW.  Delayed balance reactions, all directions; constant manual assist to prevent LOB  Stairs            Wheelchair Mobility     Tilt Bed    Modified Rankin (Stroke Patients Only)       Balance Overall balance assessment: Needs assistance Sitting-balance support: No upper extremity supported, Feet supported Sitting balance-Leahy Scale: Fair     Standing balance support: Bilateral upper extremity supported Standing balance-Leahy Scale: Poor  Pertinent Vitals/Pain Pain Assessment Pain Assessment: Faces Faces Pain Scale: Hurts even more Pain Location: L shoulder Pain Descriptors / Indicators: Aching, Grimacing, Guarding, Moaning Pain  Intervention(s): Limited activity within patient's tolerance, Monitored during session, Repositioned    Home Living   Living Arrangements: Alone                 Additional Comments: Patient limited historian; will verify with family or as mentation improves    Prior Function Prior Level of Function : Patient poor historian/Family not available             Mobility Comments: Patient indicates that she is ambulatory with 4WRW; will verify accuracy with family or as mentation improves       Extremity/Trunk Assessment   Upper Extremity Assessment Upper Extremity Assessment: Generalized weakness (baseline shoulder issues/pain, limited ROM; grossly 3-/5 throughout)    Lower Extremity Assessment Lower Extremity Assessment: Generalized weakness (grossly at least 3+/5 throughout)       Communication        Cognition Arousal: Lethargic Behavior During Therapy: Flat affect   PT - Cognitive impairments: No family/caregiver present to determine baseline                       PT - Cognition Comments: Oriented to self, month and year; believes she is at her home.  Inconsistently follows simple commands; limited insight/awareness ovearll.  Maintains eyes closed throughout session, but verbally responsive and interactive with therapist Following commands: Impaired Following commands impaired: Follows one step commands inconsistently     Cueing Cueing Techniques: Verbal cues, Gestural cues, Tactile cues     General Comments      Exercises     Assessment/Plan    PT Assessment Patient needs continued PT services  PT Problem List Decreased strength;Decreased range of motion;Decreased activity tolerance;Decreased balance;Decreased mobility;Decreased coordination;Decreased cognition;Decreased knowledge of use of DME;Decreased safety awareness;Decreased knowledge of precautions;Pain       PT Treatment Interventions DME instruction;Gait training;Stair  training;Functional mobility training;Therapeutic activities;Therapeutic exercise;Balance training;Cognitive remediation;Patient/family education    PT Goals (Current goals can be found in the Care Plan section)  Acute Rehab PT Goals PT Goal Formulation: Patient unable to participate in goal setting Time For Goal Achievement: 11/26/23 Potential to Achieve Goals: Fair    Frequency Min 2X/week     Co-evaluation               AM-PAC PT 6 Clicks Mobility  Outcome Measure Help needed turning from your back to your side while in a flat bed without using bedrails?: A Lot Help needed moving from lying on your back to sitting on the side of a flat bed without using bedrails?: A Lot Help needed moving to and from a bed to a chair (including a wheelchair)?: A Lot Help needed standing up from a chair using your arms (e.g., wheelchair or bedside chair)?: A Lot Help needed to walk in hospital room?: A Lot Help needed climbing 3-5 steps with a railing? : Total 6 Click Score: 11    End of Session Equipment Utilized During Treatment: Gait belt Activity Tolerance: Patient limited by lethargy;Patient limited by pain Patient left: in bed;with call bell/phone within reach;with nursing/sitter in room Nurse Communication: Mobility status PT Visit Diagnosis: Muscle weakness (generalized) (M62.81);Difficulty in walking, not elsewhere classified (R26.2)    Time: 1444-1510 PT Time Calculation (min) (ACUTE ONLY): 26 min   Charges:   PT Evaluation $PT Eval Moderate Complexity: 1 Mod  PT General Charges $$ ACUTE PT VISIT: 1 Visit         Tonny Isensee H. Delores, PT, DPT, NCS 11/12/23, 4:40 PM [phone]

## 2023-11-12 NOTE — Progress Notes (Signed)
 PROGRESS NOTE    Gail Paul  FMW:[id number] DOB: 17-Sep-1933 DOA: 11/11/2023 PCP: Fernand Fredy RAMAN, MD    Brief Narrative:   89 y.o. female with medical history significant of essential hypertension, diastolic/CHF, osteoarthritis, diet-controlled diabetes, chronic kidney disease stage II, coronary artery disease, peripheral arterial disease, morbid obesity, generalized anxiety disorder, hyperlipidemia, Mild intermittent asthma, depression with anxiety who presents to the ER with altered mental status.  Patient apparently was doing fine until yesterday when she had full dinner.  Thereafter she received some CBD tablets.  Her friend who is a history giver lives with her.  She reported that since the patient took those tablets she was sleeping for the most part today.  She is drowsy now mostly unresponsive.  Brought into the ER like that.  In the ER so far patient has started responding on able to answer some questions but mostly drowsy and out of it.  At this point she has been admitted with acute metabolic encephalopathy.  Suspicion is that her symptoms are drug related.  Will admit her for observation and supportive care    Assessment & Plan:   Principal Problem:   Acute metabolic encephalopathy Active Problems:   Diabetic peripheral neuropathy (HCC)   DDD (degenerative disc disease), cervical   Type 2 diabetes mellitus with peripheral neuropathy (HCC)   Essential hypertension   CAD (coronary artery disease)   PAD (peripheral artery disease) (HCC)   Hyperlipidemia   Congestive heart failure (HCC)   Acute toxic metabolic encephalopathy: Unclear cause.  Likely secondary to CBD Gummies.  Interestingly urine drug screen negative.  Mental status slowly improving.  Per friend at bedside on 9/9 patient is more aware that she was but not yet at her baseline.  VBG reassuring.  Metabolites overall within normal limits Plan: Continue to monitor in house Defer MRI and EEG for now Avoid any  nonessential sedatives Consider discharge 9/10 Will request therapy evaluations    #2 essential hypertension: No antihypertensives on home medication regimen   #3 coronary artery disease: Stable.  Continue Plavix    #4 hyperlipidemia: Resume statin   #5 peripheral arterial disease: As above   #6 type 2 diabetes: SSI for now   #7 CKD type II: Renal function appears at baseline   #8 diastolic CHF: Compensated, no evidence of exacerbation.  I see no diuretics on home regimen.   DVT prophylaxis: Lovenox  Code Status: Full Family Communication: Friend at bedside 9/9 Disposition Plan: Status is: Observation The patient will require care spanning > 2 midnights and should be moved to inpatient because: Acute toxic metabolic encephalopathy of unclear etiology   Level of care: Telemetry Medical  Consultants:  None  Procedures:  None  Antimicrobials: None   Subjective: Seen and examined resting in bed.  Appears somewhat confused but is alert and oriented to person and place  Objective: Vitals:   11/12/23 1229 11/12/23 1302 11/12/23 1347 11/12/23 1507  BP: (!) 105/48 (!) 97/56 (!) 97/56 (!) 140/59  Pulse: 61 60 60 (!) 55  Resp: 16  16 17   Temp: 97.8 F (36.6 C) 98.2 F (36.8 C) 98.2 F (36.8 C) (!) 97.5 F (36.4 C)  TempSrc: Oral Oral Oral Oral  SpO2: 100%   96%  Weight:   75.9 kg   Height:   5' 2 (1.575 m)     Intake/Output Summary (Last 24 hours) at 11/12/2023 1510 Last data filed at 11/11/2023 2207 Gross per 24 hour  Intake 1000 ml  Output --  Net 1000 ml   Filed Weights   11/11/23 1736 11/12/23 1347  Weight: 75.9 kg 75.9 kg    Examination:  General exam: Appears calm and comfortable  Respiratory system: Clear to auscultation. Respiratory effort normal. Cardiovascular system: S1-S2, RRR, no murmurs, no pedal edema Gastrointestinal system: Soft, NT/ND, normal bowel sounds Central nervous system:.  Oriented x 2.  No focal deficits Extremities: Symmetric 5  x 5 power. Skin: No rashes, lesions or ulcers Psychiatry: Judgement and insight appear impaired. Mood & affect confused.     Data Reviewed: I have personally reviewed following labs and imaging studies  CBC: Recent Labs  Lab 11/11/23 1737 11/12/23 0115  WBC 8.0 8.4  NEUTROABS 4.7  --   HGB 12.4 10.9*  HCT 38.8 34.0*  MCV 96.3 96.3  PLT 248 206   Basic Metabolic Panel: Recent Labs  Lab 11/11/23 1737 11/12/23 0115  NA 139 141  K 4.1 4.4  CL 104 107  CO2 26 26  GLUCOSE 112* 98  BUN 24* 22  CREATININE 1.25* 1.09*  CALCIUM  9.3 8.4*   GFR: Estimated Creatinine Clearance: 34 mL/min (A) (by C-G formula based on SCr of 1.09 mg/dL (H)). Liver Function Tests: Recent Labs  Lab 11/11/23 1737 11/12/23 0115  AST 23 21  ALT 15 14  ALKPHOS 40 36*  BILITOT 0.8 0.6  PROT 6.9 6.1*  ALBUMIN 4.0 3.4*   No results for input(s): LIPASE, AMYLASE in the last 168 hours. Recent Labs  Lab 11/12/23 0925  AMMONIA 13   Coagulation Profile: Recent Labs  Lab 11/11/23 1737  INR 1.1   Cardiac Enzymes: No results for input(s): CKTOTAL, CKMB, CKMBINDEX, TROPONINI in the last 168 hours. BNP (last 3 results) No results for input(s): PROBNP in the last 8760 hours. HbA1C: No results for input(s): HGBA1C in the last 72 hours. CBG: Recent Labs  Lab 11/11/23 1851 11/11/23 2214 11/12/23 0041 11/12/23 0737 11/12/23 1203  GLUCAP 94 97 83 79 91   Lipid Profile: No results for input(s): CHOL, HDL, LDLCALC, TRIG, CHOLHDL, LDLDIRECT in the last 72 hours. Thyroid  Function Tests: No results for input(s): TSH, T4TOTAL, FREET4, T3FREE, THYROIDAB in the last 72 hours. Anemia Panel: No results for input(s): VITAMINB12, FOLATE, FERRITIN, TIBC, IRON, RETICCTPCT in the last 72 hours. Sepsis Labs: Recent Labs  Lab 11/11/23 1842 11/12/23 0115  LATICACIDVEN 1.4 1.3    No results found for this or any previous visit (from the past 240 hours).        Radiology Studies: CT Head Wo Contrast Result Date: 11/11/2023 CLINICAL DATA:  Slurred speech, altered level of consciousness EXAM: CT HEAD WITHOUT CONTRAST TECHNIQUE: Contiguous axial images were obtained from the base of the skull through the vertex without intravenous contrast. RADIATION DOSE REDUCTION: This exam was performed according to the departmental dose-optimization program which includes automated exposure control, adjustment of the mA and/or kV according to patient size and/or use of iterative reconstruction technique. COMPARISON:  None Available. FINDINGS: Brain: No acute infarct or hemorrhage. Lateral ventricles and midline structures are unremarkable. No acute extra-axial fluid collections. No mass effect. Vascular: No hyperdense vessel or unexpected calcification. Skull: Normal. Negative for fracture or focal lesion. Sinuses/Orbits: No acute finding. Other: None. IMPRESSION: Stable head CT, no acute intracranial process. Electronically Signed   By: Ozell Daring M.D.   On: 11/11/2023 18:49   DG Chest Port 1 View Result Date: 11/11/2023 CLINICAL DATA:  Questionable sepsis EXAM: PORTABLE CHEST 1 VIEW COMPARISON:  09/26/2022 FINDINGS: Single frontal view  of the chest demonstrates a stable cardiac silhouette. No airspace disease, effusion, or pneumothorax. Stable degenerative changes of the right shoulder. No acute bony abnormalities. IMPRESSION: 1. No acute intrathoracic process. Electronically Signed   By: Ozell Daring M.D.   On: 11/11/2023 18:24        Scheduled Meds:  clopidogrel   75 mg Oral Daily   diclofenac  Sodium  4 g Topical QID   enoxaparin  (LOVENOX ) injection  40 mg Subcutaneous Q24H   insulin  aspart  0-15 Units Subcutaneous TID WC   insulin  aspart  0-5 Units Subcutaneous QHS   Continuous Infusions:  lactated ringers  Stopped (11/12/23 1457)     LOS: 0 days       Calvin KATHEE Robson, MD [facility]   If 7PM-7AM, please contact  night-coverage  11/12/2023, 3:10 PM

## 2023-11-12 NOTE — Progress Notes (Signed)
 Hypoglycemic Event  CBG: 61  Treatment: D50 25 mL (12.5 gm)  Symptoms: None  Follow-up CBG: Time:1704 CBG Result:143  Possible Reasons for Event: Inadequate meal intake and altered mental status.   Comments/MD notified:Sudheer Jhonny, MD    Gail Paul Search

## 2023-11-12 NOTE — Progress Notes (Signed)
 Patient ripped out two peripheral IV's. MD aware.

## 2023-11-12 NOTE — Significant Event (Signed)
 Patient has now ripped out 2 PIVs.  OK per MD to leave out for now.  No immediate need for IV medications.  Encourage PO fluid and food intake Avoid sedatives if able Monitor mental status Last A1c 6.1.  Non diabetic range.  Can dc CBGs.   Place on reg diet  Calvin Robson MD

## 2023-11-13 DIAGNOSIS — Z825 Family history of asthma and other chronic lower respiratory diseases: Secondary | ICD-10-CM | POA: Diagnosis not present

## 2023-11-13 DIAGNOSIS — Z823 Family history of stroke: Secondary | ICD-10-CM | POA: Diagnosis not present

## 2023-11-13 DIAGNOSIS — G9341 Metabolic encephalopathy: Secondary | ICD-10-CM | POA: Diagnosis not present

## 2023-11-13 DIAGNOSIS — E11649 Type 2 diabetes mellitus with hypoglycemia without coma: Secondary | ICD-10-CM | POA: Diagnosis present

## 2023-11-13 DIAGNOSIS — I2510 Atherosclerotic heart disease of native coronary artery without angina pectoris: Secondary | ICD-10-CM | POA: Diagnosis present

## 2023-11-13 DIAGNOSIS — Z886 Allergy status to analgesic agent status: Secondary | ICD-10-CM | POA: Diagnosis not present

## 2023-11-13 DIAGNOSIS — Z888 Allergy status to other drugs, medicaments and biological substances status: Secondary | ICD-10-CM | POA: Diagnosis not present

## 2023-11-13 DIAGNOSIS — Z8249 Family history of ischemic heart disease and other diseases of the circulatory system: Secondary | ICD-10-CM | POA: Diagnosis not present

## 2023-11-13 DIAGNOSIS — T40711A Poisoning by cannabis, accidental (unintentional), initial encounter: Secondary | ICD-10-CM | POA: Diagnosis present

## 2023-11-13 DIAGNOSIS — F32A Depression, unspecified: Secondary | ICD-10-CM | POA: Diagnosis present

## 2023-11-13 DIAGNOSIS — E66811 Obesity, class 1: Secondary | ICD-10-CM | POA: Diagnosis present

## 2023-11-13 DIAGNOSIS — Z91013 Allergy to seafood: Secondary | ICD-10-CM | POA: Diagnosis not present

## 2023-11-13 DIAGNOSIS — E1151 Type 2 diabetes mellitus with diabetic peripheral angiopathy without gangrene: Secondary | ICD-10-CM | POA: Diagnosis present

## 2023-11-13 DIAGNOSIS — J4520 Mild intermittent asthma, uncomplicated: Secondary | ICD-10-CM | POA: Diagnosis present

## 2023-11-13 DIAGNOSIS — R400 Somnolence: Secondary | ICD-10-CM | POA: Diagnosis present

## 2023-11-13 DIAGNOSIS — I252 Old myocardial infarction: Secondary | ICD-10-CM | POA: Diagnosis not present

## 2023-11-13 DIAGNOSIS — Z885 Allergy status to narcotic agent status: Secondary | ICD-10-CM | POA: Diagnosis not present

## 2023-11-13 DIAGNOSIS — E1122 Type 2 diabetes mellitus with diabetic chronic kidney disease: Secondary | ICD-10-CM | POA: Diagnosis present

## 2023-11-13 DIAGNOSIS — G928 Other toxic encephalopathy: Secondary | ICD-10-CM | POA: Diagnosis present

## 2023-11-13 DIAGNOSIS — G8929 Other chronic pain: Secondary | ICD-10-CM | POA: Diagnosis present

## 2023-11-13 DIAGNOSIS — I5032 Chronic diastolic (congestive) heart failure: Secondary | ICD-10-CM | POA: Diagnosis present

## 2023-11-13 DIAGNOSIS — I130 Hypertensive heart and chronic kidney disease with heart failure and stage 1 through stage 4 chronic kidney disease, or unspecified chronic kidney disease: Secondary | ICD-10-CM | POA: Diagnosis present

## 2023-11-13 DIAGNOSIS — E1142 Type 2 diabetes mellitus with diabetic polyneuropathy: Secondary | ICD-10-CM | POA: Diagnosis present

## 2023-11-13 DIAGNOSIS — E785 Hyperlipidemia, unspecified: Secondary | ICD-10-CM | POA: Diagnosis present

## 2023-11-13 DIAGNOSIS — E16A1 Hypoglycemia level 1: Secondary | ICD-10-CM | POA: Diagnosis present

## 2023-11-13 DIAGNOSIS — Z7902 Long term (current) use of antithrombotics/antiplatelets: Secondary | ICD-10-CM | POA: Diagnosis not present

## 2023-11-13 LAB — BASIC METABOLIC PANEL WITH GFR
Anion gap: 6 (ref 5–15)
BUN: 20 mg/dL (ref 8–23)
CO2: 27 mmol/L (ref 22–32)
Calcium: 8.6 mg/dL — ABNORMAL LOW (ref 8.9–10.3)
Chloride: 110 mmol/L (ref 98–111)
Creatinine, Ser: 1.08 mg/dL — ABNORMAL HIGH (ref 0.44–1.00)
GFR, Estimated: 49 mL/min — ABNORMAL LOW (ref >60)
Glucose, Bld: 76 mg/dL (ref 70–99)
Potassium: 4.3 mmol/L (ref 3.5–5.1)
Sodium: 143 mmol/L (ref 135–145)

## 2023-11-13 LAB — CBC WITH DIFFERENTIAL/PLATELET
Abs Immature Granulocytes: 0.02 K/uL (ref 0.00–0.07)
Basophils Absolute: 0 K/uL (ref 0.0–0.1)
Basophils Relative: 1 %
Eosinophils Absolute: 0.2 K/uL (ref 0.0–0.5)
Eosinophils Relative: 3 %
HCT: 32.4 % — ABNORMAL LOW (ref 36.0–46.0)
Hemoglobin: 10.4 g/dL — ABNORMAL LOW (ref 12.0–15.0)
Immature Granulocytes: 0 %
Lymphocytes Relative: 18 %
Lymphs Abs: 1.3 K/uL (ref 0.7–4.0)
MCH: 30.5 pg (ref 26.0–34.0)
MCHC: 32.1 g/dL (ref 30.0–36.0)
MCV: 95 fL (ref 80.0–100.0)
Monocytes Absolute: 0.5 K/uL (ref 0.1–1.0)
Monocytes Relative: 7 %
Neutro Abs: 5.1 K/uL (ref 1.7–7.7)
Neutrophils Relative %: 71 %
Platelets: 199 K/uL (ref 150–400)
RBC: 3.41 MIL/uL — ABNORMAL LOW (ref 3.87–5.11)
RDW: 13.2 % (ref 11.5–15.5)
WBC: 7.2 K/uL (ref 4.0–10.5)
nRBC: 0 % (ref 0.0–0.2)

## 2023-11-13 LAB — BLOOD GAS, VENOUS
Bicarbonate: 28.3 mmol/L — AB (ref 20.0–28.0)
Bicarbonate: 29 mmol/L — AB (ref 20.0–28.0)
O2 Saturation: 46.6 mmol/L (ref 0.0–2.0)
O2 Saturation: 45.2 mmol/L (ref 0.0–2.0)
Patient temperature: 37
Patient temperature: 46.6
Patient temperature: 37
Patient temperature: 45.2
pCO2, Ven: 63 mmHg — ABNORMAL HIGH (ref 44–60)
pCO2, Ven: 59 mmHg (ref 44–60)
pH, Ven: 7.26 (ref 7.25–7.43)
pH, Ven: 7.3 (ref 7.25–7.43)
pO2, Ven: 28.3 mmHg — AB (ref 32–45)
pO2, Ven: <31 mmHg — CL (ref 32–45)

## 2023-11-13 MED ORDER — SODIUM CHLORIDE 0.9 % IV SOLN
INTRAVENOUS | Status: DC
Start: 2023-11-13 — End: 2023-11-13

## 2023-11-13 MED ORDER — ROSUVASTATIN CALCIUM 20 MG PO TABS
40.0000 mg | ORAL_TABLET | Freq: Every day | ORAL | Status: DC
  Administered 2023-11-13 – 2023-11-14 (×2): 40 mg via ORAL
  Filled 2023-11-13 (×2): qty 2

## 2023-11-13 MED ORDER — ENSURE PLUS HIGH PROTEIN PO LIQD
237.0000 mL | Freq: Two times a day (BID) | ORAL | Status: DC
  Administered 2023-11-13 – 2023-11-14 (×4): 237 mL via ORAL

## 2023-11-13 NOTE — Evaluation (Signed)
 Occupational Therapy Evaluation Patient Details Name: Gail Paul MRN: [id number] DOB: 11/08/1933 Today's Date: 11/13/2023   History of Present Illness   presented to ER secondary to AMS, increased lethargy; admitted for management of acute metabolic encephalopathy (suspected secondary to medication-related)     Clinical Impressions Pt was seen for OT evaluation this date. Pt is a poor historian at this time, PLOF and home set up obtained from primary contact. Prior to hospital admission, pt was MODI, amb with rollator, driving self to out of town appointments. Pt lives alone with a close friend who check in on her often. Pt presents with deficits in cognition, alertness, activity tolerance, generalized weakness and problem solving affecting safe and optimal ADL completion. Pt was oriented to self only, refused to open eyes, would rarely crack eyes open then shut them quickly. Pt currently requires MOD-MINA for rolling R and L during pericare and linen change. Pt often yells out and jerks back when attempting to progress OOB mobility. Pt would benefit from skilled OT services to address noted impairments and functional limitations (see below for any additional details) in order to maximize safety and independence while minimizing future risk of falls, injury, and readmission. OT will follow acutely.     If plan is discharge home, recommend the following:   Two people to help with bathing/dressing/bathroom;Two people to help with walking and/or transfers;Assistance with cooking/housework;Direct supervision/assist for financial management;Direct supervision/assist for medications management;Assist for transportation;Help with stairs or ramp for entrance;Supervision due to cognitive status     Functional Status Assessment   Patient has had a recent decline in their functional status and demonstrates the ability to make significant improvements in function in a reasonable and predictable  amount of time.     Equipment Recommendations   Other (comment) (defer to next venue of care)              Precautions/Restrictions   Precautions Precautions: Fall Recall of Precautions/Restrictions: Impaired Restrictions Weight Bearing Restrictions Per Provider Order: No     Mobility Bed Mobility Overal bed mobility: Needs Assistance Bed Mobility: Rolling Rolling: Mod assist, Min assist         General bed mobility comments: MINA-MODA for rolling during bed level pericare    Transfers                   General transfer comment: Unsafe to attempt due to cognition, will continue to assess                                                 ADL either performed or assessed with clinical judgement   ADL Overall ADL's : Needs assistance/impaired Eating/Feeding: Moderate assistance;Bed level   Grooming: Wash/dry face;Moderate assistance;Bed level           Upper Body Dressing : Maximal assistance;Bed level   Lower Body Dressing: Maximal assistance;Bed level       Toileting- Clothing Manipulation and Hygiene: Maximal assistance;Bed level;Cueing for sequencing         General ADL Comments: MAXA for all ADL tasks at bed level, pt refuses sitting on the EOB, yells out stating she is in pain - declines pain meds when RN in room      Pertinent Vitals/Pain Pain Assessment Pain Assessment: 0-10 Pain Score: 10-Worst pain ever Pain Location: L shoulder, back Pain Descriptors / Indicators:  Aching, Grimacing, Guarding, Moaning Pain Intervention(s): Limited activity within patient's tolerance, Monitored during session, Patient requesting pain meds-RN notified     Extremity/Trunk Assessment Upper Extremity Assessment Upper Extremity Assessment: Difficult to assess due to impaired cognition;RUE deficits/detail RUE Deficits / Details: chronic shoulder deficits RUE: Unable to fully assess due to pain   Lower Extremity  Assessment Lower Extremity Assessment: Defer to PT evaluation;Generalized weakness       Communication Communication Communication: Impaired Factors Affecting Communication: Difficulty expressing self   Cognition Arousal: Lethargic Behavior During Therapy: Flat affect Cognition: Cognition impaired, No family/caregiver present to determine baseline   Orientation impairments: Person Awareness: Intellectual awareness impaired, Online awareness impaired Memory impairment (select all impairments): Short-term memory, Declarative long-term memory, Non-declarative long-term memory, Working memory Attention impairment (select first level of impairment): Focused attention, Sustained attention, Selective attention, Alternating attention, Divided attention Executive functioning impairment (select all impairments): Initiation, Organization, Sequencing, Reasoning, Problem solving OT - Cognition Comments: A/Ox1, self only                 Following commands: Impaired Following commands impaired: Follows one step commands inconsistently     Cueing  General Comments   Cueing Techniques: Verbal cues;Gestural cues;Tactile cues  Pt soiled on arrival to room, chuck pads changes and pt peri area cleaned, MAXA   Exercises Exercises: Other exercises Other Exercises Other Exercises: Edu: Role of OT session, orientation, delirum prevention   Shoulder Instructions      Home Living Family/patient expects to be discharged to:: Private residence Living Arrangements: Alone Available Help at Discharge: Friend(s);Available PRN/intermittently Type of Home: House Home Access: Stairs to enter [employer] of Steps: 4 Entrance Stairs-Rails: Can reach both Home Layout: One level     Bathroom Shower/Tub: Tub/shower unit         Home Equipment: Agricultural consultant (2 wheels);Cane - single point   Additional Comments: Patient limited historian; will verify with family or as mentation  improves      Prior Functioning/Environment Prior Level of Function : Needs assist             Mobility Comments: Patient indicates that she is ambulatory with [phone]; will verify accuracy with family or as mentation improves ADLs Comments: Drives grocery once weekly, drives to [place] for MD appointments, MODI at baseline, friend comes by often to check in on her but unable to provide physical assistance    OT Problem List: Decreased strength;Decreased activity tolerance;Decreased range of motion;Impaired balance (sitting and/or standing);Impaired vision/perception;Decreased coordination;Decreased cognition;Decreased safety awareness;Decreased knowledge of use of DME or AE;Decreased knowledge of precautions;Pain   OT Treatment/Interventions: Self-care/ADL training;Therapeutic exercise;Energy conservation;DME and/or AE instruction;Therapeutic activities;Patient/family education;Balance training      OT Goals(Current goals can be found in the care plan section)   Acute Rehab OT Goals OT Goal Formulation: Patient unable to participate in goal setting Time For Goal Achievement: 11/27/23 Potential to Achieve Goals: Fair   OT Frequency:  Min 2X/week                  AM-PAC OT 6 Clicks Daily Activity     Outcome Measure Help from another person eating meals?: A Lot Help from another person taking care of personal grooming?: A Lot Help from another person toileting, which includes using toliet, bedpan, or urinal?: A Lot Help from another person bathing (including washing, rinsing, drying)?: A Lot Help from another person to put on and taking off regular upper body clothing?: A Lot Help from another person to put  on and taking off regular lower body clothing?: A Lot 6 Click Score: 12   End of Session Nurse Communication: Mobility status  Activity Tolerance: Patient limited by lethargy Patient left: in bed;with call bell/phone within reach;with bed alarm set;with  nursing/sitter in room  OT Visit Diagnosis: Unsteadiness on feet (R26.81);Other abnormalities of gait and mobility (R26.89);Muscle weakness (generalized) (M62.81);Other symptoms and signs involving cognitive function                Time: 9069-9042 OT Time Calculation (min): 27 min Charges:  OT General Charges $OT Visit: 1 Visit OT Evaluation $OT Eval Moderate Complexity: 1 Mod OT Treatments $Self Care/Home Management : 8-22 mins  Larraine Colas M.S. OTR/L  11/13/23, 10:25 AM

## 2023-11-13 NOTE — Plan of Care (Signed)

## 2023-11-13 NOTE — Progress Notes (Signed)
 PROGRESS NOTE    Gail Paul  FMW:[id number] DOB: September 04, 1933 DOA: 11/11/2023 PCP: Fernand Fredy RAMAN, MD    Brief Narrative:   89 y.o. female with medical history significant of essential hypertension, diastolic/CHF, osteoarthritis, diet-controlled diabetes, chronic kidney disease stage II, coronary artery disease, peripheral arterial disease, morbid obesity, generalized anxiety disorder, hyperlipidemia, Mild intermittent asthma, depression with anxiety who presents to the ER with altered mental status.  Patient apparently was doing fine until yesterday when she had full dinner.  Thereafter she received some CBD tablets.  Her friend who is a history giver lives with her.  She reported that since the patient took those tablets she was sleeping for the most part today.  She is drowsy now mostly unresponsive.  Brought into the ER like that.  In the ER so far patient has started responding on able to answer some questions but mostly drowsy and out of it.  At this point she has been admitted with acute metabolic encephalopathy.  Suspicion is that her symptoms are drug related.  Will admit her for observation and supportive care    Assessment & Plan:   Principal Problem:   Acute metabolic encephalopathy Active Problems:   Diabetic peripheral neuropathy (HCC)   DDD (degenerative disc disease), cervical   Type 2 diabetes mellitus with peripheral neuropathy (HCC)   Essential hypertension   CAD (coronary artery disease)   PAD (peripheral artery disease) (HCC)   Obesity (BMI 30-39.9)   Hyperlipidemia   Congestive heart failure (HCC)   Acute toxic metabolic encephalopathy: Unclear cause.  Likely secondary to CBD Gummies.  Interestingly urine drug screen negative but this may have been a synthetic marijuana.  Mental status slowly improving.  Per friend at bedside on 9/9 patient is more aware that she was but not yet at her baseline.  VBG reassuring.  Metabolites overall within normal limits. Ua neg,  blood culture ng, ct head nothing acute. Declines rehab Plan: Work with therapy  #2 essential hypertension: mod elevation. No antihypertensives on home medication regimen   #3 coronary artery disease: Stable.  Continue Plavix    #4 hyperlipidemia: home statin   #5 peripheral arterial disease: As above   #6 type 2 diabetes: SSI for now   #7 CKD type II: Renal function appears at baseline   #8 diastolic CHF: Compensated, no evidence of exacerbation.      DVT prophylaxis: Lovenox  Code Status: Full Family Communication: Friend at bedside 9/10 Disposition Plan: Status is: Observation The patient will require care spanning > 2 midnights and should be moved to inpatient because: Acute toxic metabolic encephalopathy of unclear etiology   Level of care: Telemetry Medical  Consultants:  None  Procedures:  None  Antimicrobials: None   Subjective: Seen and examined resting in bed.  No complaints  Objective: Vitals:   11/12/23 1959 11/13/23 0518 11/13/23 0829 11/13/23 1538  BP: 100/65 (!) 173/79 (!) 183/75 (!) 157/58  Pulse: 71 76 81 63  Resp: 20 20 20 18   Temp: (!) 97.5 F (36.4 C) (!) 96.9 F (36.1 C) 98 F (36.7 C) 97.7 F (36.5 C)  TempSrc:      SpO2: 98% 100% 97% 98%  Weight:      Height:       No intake or output data in the 24 hours ending 11/13/23 1647  Filed Weights   11/11/23 1736 11/12/23 1347  Weight: 75.9 kg 75.9 kg    Examination:  General exam: Appears calm and comfortable  Respiratory system: Clear  to auscultation. Respiratory effort normal. Cardiovascular system: S1-S2, RRR, no murmurs, no pedal edema Gastrointestinal system: Soft, NT/ND, normal bowel sounds Central nervous system:.  Oriented x 2.  No focal deficits Extremities: Symmetric 5 x 5 power. Skin: No rashes, lesions or ulcers Psychiatry: Judgement and insight appear impaired. Mood & affect confused.     Data Reviewed: I have personally reviewed following labs and imaging  studies  CBC: Recent Labs  Lab 11/11/23 1737 11/12/23 0115 11/13/23 0323  WBC 8.0 8.4 7.2  NEUTROABS 4.7  --  5.1  HGB 12.4 10.9* 10.4*  HCT 38.8 34.0* 32.4*  MCV 96.3 96.3 95.0  PLT 248 206 199   Basic Metabolic Panel: Recent Labs  Lab 11/11/23 1737 11/12/23 0115 11/13/23 0323  NA 139 141 143  K 4.1 4.4 4.3  CL 104 107 110  CO2 26 26 27   GLUCOSE 112* 98 76  BUN 24* 22 20  CREATININE 1.25* 1.09* 1.08*  CALCIUM  9.3 8.4* 8.6*   GFR: Estimated Creatinine Clearance: 34.3 mL/min (A) (by C-G formula based on SCr of 1.08 mg/dL (H)). Liver Function Tests: Recent Labs  Lab 11/11/23 1737 11/12/23 0115  AST 23 21  ALT 15 14  ALKPHOS 40 36*  BILITOT 0.8 0.6  PROT 6.9 6.1*  ALBUMIN 4.0 3.4*   No results for input(s): LIPASE, AMYLASE in the last 168 hours. Recent Labs  Lab 11/12/23 0925  AMMONIA 13   Coagulation Profile: Recent Labs  Lab 11/11/23 1737  INR 1.1   Cardiac Enzymes: No results for input(s): CKTOTAL, CKMB, CKMBINDEX, TROPONINI in the last 168 hours. BNP (last 3 results) No results for input(s): PROBNP in the last 8760 hours. HbA1C: No results for input(s): HGBA1C in the last 72 hours. CBG: Recent Labs  Lab 11/12/23 0737 11/12/23 1203 11/12/23 1618 11/12/23 1645 11/12/23 1704  GLUCAP 79 91 60* 67* 143*   Lipid Profile: No results for input(s): CHOL, HDL, LDLCALC, TRIG, CHOLHDL, LDLDIRECT in the last 72 hours. Thyroid  Function Tests: No results for input(s): TSH, T4TOTAL, FREET4, T3FREE, THYROIDAB in the last 72 hours. Anemia Panel: No results for input(s): VITAMINB12, FOLATE, FERRITIN, TIBC, IRON, RETICCTPCT in the last 72 hours. Sepsis Labs: Recent Labs  Lab 11/11/23 1842 11/12/23 0115  LATICACIDVEN 1.4 1.3    Recent Results (from the past 240 hours)  Blood Culture (routine x 2)     Status: None (Preliminary result)   Collection Time: 11/11/23  6:42 PM   Specimen: BLOOD  Result  Value Ref Range Status   Specimen Description BLOOD LEFT ANTECUBITAL  Final   Special Requests   Final    BOTTLES DRAWN AEROBIC AND ANAEROBIC Blood Culture results may not be optimal due to an inadequate volume of blood received in culture bottles   Culture   Final    NO GROWTH 2 DAYS Performed at [facility], [address], [place], [place] [address]    Report Status PENDING  Incomplete  Blood Culture (routine x 2)     Status: None (Preliminary result)   Collection Time: 11/12/23  1:15 AM   Specimen: BLOOD  Result Value Ref Range Status   Specimen Description BLOOD RIGHT ARM  Final   Special Requests   Final    BOTTLES DRAWN AEROBIC AND ANAEROBIC Blood Culture results may not be optimal due to an inadequate volume of blood received in culture bottles   Culture   Final    NO GROWTH 1 DAY Performed at [facility], [address] [place], [place] [address]    Report Status PENDING  Incomplete         Radiology Studies: CT Head Wo Contrast Result Date: 11/11/2023 CLINICAL DATA:  Slurred speech, altered level of consciousness EXAM: CT HEAD WITHOUT CONTRAST TECHNIQUE: Contiguous axial images were obtained from the base of the skull through the vertex without intravenous contrast. RADIATION DOSE REDUCTION: This exam was performed according to the departmental dose-optimization program which includes automated exposure control, adjustment of the mA and/or kV according to patient size and/or use of iterative reconstruction technique. COMPARISON:  None Available. FINDINGS: Brain: No acute infarct or hemorrhage. Lateral ventricles and midline structures are unremarkable. No acute extra-axial fluid collections. No mass effect. Vascular: No hyperdense vessel or unexpected calcification. Skull: Normal. Negative for fracture or focal lesion. Sinuses/Orbits: No acute finding. Other: None. IMPRESSION: Stable head CT, no acute intracranial process. Electronically Signed   By:  Ozell Daring M.D.   On: 11/11/2023 18:49   DG Chest Port 1 View Result Date: 11/11/2023 CLINICAL DATA:  Questionable sepsis EXAM: PORTABLE CHEST 1 VIEW COMPARISON:  09/26/2022 FINDINGS: Single frontal view of the chest demonstrates a stable cardiac silhouette. No airspace disease, effusion, or pneumothorax. Stable degenerative changes of the right shoulder. No acute bony abnormalities. IMPRESSION: 1. No acute intrathoracic process. Electronically Signed   By: Ozell Daring M.D.   On: 11/11/2023 18:24        Scheduled Meds:  clopidogrel   75 mg Oral Daily   diclofenac  Sodium  4 g Topical QID   enoxaparin  (LOVENOX ) injection  40 mg Subcutaneous Q24H   feeding supplement  237 mL Oral BID BM   rosuvastatin   40 mg Oral Daily   Continuous Infusions:     LOS: 0 days       Devaughn KATHEE Ban, MD [facility]   If 7PM-7AM, please contact night-coverage  11/13/2023, 4:47 PM

## 2023-11-14 DIAGNOSIS — G9341 Metabolic encephalopathy: Secondary | ICD-10-CM | POA: Diagnosis not present

## 2023-11-14 LAB — BASIC METABOLIC PANEL WITH GFR
Anion gap: 9 (ref 5–15)
BUN: 19 mg/dL (ref 8–23)
CO2: 29 mmol/L (ref 22–32)
Calcium: 9 mg/dL (ref 8.9–10.3)
Chloride: 106 mmol/L (ref 98–111)
Creatinine, Ser: 1.11 mg/dL — ABNORMAL HIGH (ref 0.44–1.00)
GFR, Estimated: 48 mL/min — ABNORMAL LOW (ref >60)
Glucose, Bld: 111 mg/dL — ABNORMAL HIGH (ref 70–99)
Potassium: 4.4 mmol/L (ref 3.5–5.1)
Sodium: 144 mmol/L (ref 135–145)

## 2023-11-14 MED ORDER — ADULT MULTIVITAMIN W/MINERALS CH
1.0000 | ORAL_TABLET | Freq: Every day | ORAL | Status: DC
  Administered 2023-11-14: 1 via ORAL
  Filled 2023-11-14: qty 1

## 2023-11-14 NOTE — Progress Notes (Signed)
 Pt has made significant functional and cognitive improvement since initial PT eval on 9/9. No physical assist needed for bed mobility or transfers. Pt able to demonstrate gait training in hall with RW and CG/S. No LOB with changes in directions. Pt states she will have her friend Grayce staying with her at d/c to ensure safe transition home. No DME needs.    11/14/23 1600  PT Visit Information  Assistance Needed +1  History of Present Illness presented to ER secondary to AMS, increased lethargy; admitted for management of acute metabolic encephalopathy (suspected secondary to medication-related)  Subjective Data  Subjective I'm ready to go home  Precautions  Precautions Fall  Recall of Precautions/Restrictions Impaired  Restrictions  Weight Bearing Restrictions Per Provider Order No  Pain Assessment  Pain Assessment Faces  Faces Pain Scale 2  Pain Location L shoulder, back  Pain Descriptors / Indicators Discomfort;Sore  Pain Intervention(s) Monitored during session  Cognition  Arousal Alert  Behavior During Therapy WFL for tasks assessed/performed  PT - Cognitive impairments No family/caregiver present to determine baseline  PT - Cognition Comments  (Much more alert and oriented today)  Following Commands  Following commands Intact  Following commands impaired Follows one step commands with increased time  Cueing  Cueing Techniques Verbal cues  Communication  Communication No apparent difficulties  Bed Mobility  Overal bed mobility Needs Assistance  Bed Mobility Supine to Sit;Sit to Supine  Rolling Modified independent (Device/Increase time);Used rails  Supine to sit Modified independent (Device/Increase time);Used rails  Sit to supine Modified independent (Device/Increase time);Used rails  General bed mobility comments  (Significant improvement since previous PT session on 9/9)  Transfers  Overall transfer level Needs assistance  Equipment used Rolling walker (2 wheels)   Transfers Sit to/from Stand  Sit to Stand Supervision  General transfer comment  (Able to stand from bed on first attempt)  Ambulation/Gait  Ambulation/Gait assistance Contact guard assist;Supervision  Gait Distance (Feet) 120 Feet  Assistive device Rolling walker (2 wheels)  Gait Pattern/deviations Step-through pattern;Decreased stride length;Trunk flexed  General Gait Details  (Step through gait, flexed posture, hx of back surgery. No LOB noted with changes in direction)  Gait velocity decreased  Stairs  (No steps to enter single level home)  Balance  Overall balance assessment Needs assistance  Sitting-balance support No upper extremity supported;Feet supported  Sitting balance-Leahy Scale Good  Standing balance support Bilateral upper extremity supported;During functional activity;Reliant on assistive device for balance  Standing balance-Leahy Scale Fair  Standing balance comment  (No LOB while ambulating with RW)  Other Exercises  Other Exercises Edu: Role of PT, home set up and safety at d/c, DME,  PT - End of Session  Equipment Utilized During Treatment Gait belt  Activity Tolerance Patient tolerated treatment well  Patient left in bed;with call bell/phone within reach;with bed alarm set  Nurse Communication Mobility status   PT - Assessment/Plan  PT Visit Diagnosis Muscle weakness (generalized) (M62.81);Difficulty in walking, not elsewhere classified (R26.2)  PT Frequency (ACUTE ONLY) Min 2X/week  Follow Up Recommendations Skilled nursing-short term rehab (<3 hours/day)  Can patient physically be transported by private vehicle Yes  Patient can return home with the following A little help with walking and/or transfers;A little help with bathing/dressing/bathroom;Assist for transportation  PT equipment Other (comment) (Pt electing to d/c home with 24 hour assist from her friend [place]. No DME needs)  AM-PAC PT 6 Clicks Mobility Outcome Measure (Version 2)  Help needed  turning from your back to  your side while in a flat bed without using bedrails? 3  Help needed moving from lying on your back to sitting on the side of a flat bed without using bedrails? 3  Help needed moving to and from a bed to a chair (including a wheelchair)? 3  Help needed standing up from a chair using your arms (e.g., wheelchair or bedside chair)? 3  Help needed to walk in hospital room? 3  Help needed climbing 3-5 steps with a railing?  3  6 Click Score 18  Consider Recommendation of Discharge To: Home with [facility]  Progressive Mobility  What is the highest level of mobility based on the mobility assessment? Level 4 (Ambulates with assistance) - Balance while stepping forward/back - Complete  Activity Ambulated with assistance  PT Goal Progression  Progress towards PT goals Progressing toward goals  PT Time Calculation  PT Start Time (ACUTE ONLY) 1500  PT Stop Time (ACUTE ONLY) 1520  PT Time Calculation (min) (ACUTE ONLY) 20 min  PT General Charges  $$ ACUTE PT VISIT 1 Visit  PT Treatments  $Therapeutic Activity 8-22 mins  Darice Bohr, PTA 11/14/2023

## 2023-11-14 NOTE — Progress Notes (Signed)
 Nutrition Follow-up  DOCUMENTATION CODES:   Obesity unspecified  INTERVENTION:   -Continue regular diet -Continue Ensure Plus High Protein po BID, each supplement provides 350 kcal and 20 grams of protein  -MVI with minerals daily  NUTRITION DIAGNOSIS:   Increased nutrient needs related to acute illness as evidenced by estimated needs.  GOAL:   Patient will meet greater than or equal to 90% of their needs  MONITOR:   PO intake, Supplement acceptance  REASON FOR ASSESSMENT:   Malnutrition Screening Tool    ASSESSMENT:   Pt with medical history significant of essential hypertension, diastolic/CHF, osteoarthritis, diet-controlled diabetes, chronic kidney disease stage II, coronary artery disease, peripheral arterial disease, morbid obesity, generalized anxiety disorder, hyperlipidemia, Mild intermittent asthma, depression with anxiety who presents to the ER with altered mental status.  Pt admitted with acute toxic metabolic encephalopathy likely related to CBD gummiest.   Reviewed I/O's: +180 ml x 24 hours and +2.4 L since admission  Pt sleeping soundly at time of visit. She did not arouse to voice or touch. No family present to provide additional history.   Pt currently on a regular diet. Noted meal completions 70-100%  Reviewed wt hx; wt has been stable over the past 3 months.   Labs reviewed: CBGS: 67-143 (inpatient orders for glycemic control are none).    NUTRITION - FOCUSED PHYSICAL EXAM:  Flowsheet Row Most Recent Value  Orbital Region Mild depletion  Upper Arm Region No depletion  Thoracic and Lumbar Region No depletion  Buccal Region No depletion  Temple Region Mild depletion  Clavicle Bone Region No depletion  Clavicle and Acromion Bone Region No depletion  Scapular Bone Region No depletion  Dorsal Hand No depletion  Patellar Region No depletion  Anterior Thigh Region No depletion  Posterior Calf Region No depletion  Edema (RD Assessment) Mild  Hair  Reviewed  Eyes Reviewed  Mouth Reviewed  Skin Reviewed  Nails Reviewed    Diet Order:   Diet Order             Diet regular Fluid consistency: Thin  Diet effective now                   EDUCATION NEEDS:   No education needs have been identified at this time  Skin:  Skin Assessment: Reviewed RN Assessment  Last BM:  11/11/23  Height:   Ht Readings from Last 1 Encounters:  11/12/23 5' 2 (1.575 m)    Weight:   Wt Readings from Last 1 Encounters:  11/12/23 75.9 kg    Ideal Body Weight:  50 kg  BMI:  Body mass index is 30.6 kg/m.  Estimated Nutritional Needs:   Kcal:  1500-1700  Protein:  75-90 grams  Fluid:  1.5-1.7 L    Margery ORN, RD, LDN, CDCES Registered Dietitian III Certified Diabetes Care and Education Specialist If unable to reach this RD, please use RD Inpatient group chat on secure chat between hours of 8am-4 pm daily

## 2023-11-14 NOTE — Plan of Care (Signed)
  Problem: Coping: Goal: Ability to adjust to condition or change in health will improve Outcome: Progressing   Problem: Metabolic: Goal: Ability to maintain appropriate glucose levels will improve Outcome: Progressing   Problem: Skin Integrity: Goal: Risk for impaired skin integrity will decrease Outcome: Progressing   Problem: Tissue Perfusion: Goal: Adequacy of tissue perfusion will improve Outcome: Progressing   Problem: Elimination: Goal: Will not experience complications related to bowel motility Outcome: Progressing   Problem: Safety: Goal: Ability to remain free from injury will improve Outcome: Progressing   Problem: Pain Managment: Goal: General experience of comfort will improve and/or be controlled Outcome: Progressing

## 2023-11-14 NOTE — Discharge Summary (Signed)
 Gail Paul FMW:[id number] DOB: June 25, 1933 DOA: 11/11/2023  PCP: Gail Fredy RAMAN, MD  Admit date: 11/11/2023 Discharge date: 11/14/2023  Time spent: 35 minutes  Recommendations for Outpatient Follow-up:  Pcp f/u, attention to BP at f/u    Discharge Diagnoses:  Principal Problem:   Acute metabolic encephalopathy Active Problems:   Diabetic peripheral neuropathy (HCC)   DDD (degenerative disc disease), cervical   Type 2 diabetes mellitus with peripheral neuropathy (HCC)   Essential hypertension   CAD (coronary artery disease)   PAD (peripheral artery disease) (HCC)   Obesity (BMI 30-39.9)   Hyperlipidemia   Congestive heart failure (HCC)   Discharge Condition: improved  Diet recommendation: heart healthy  Filed Weights   11/11/23 1736 11/12/23 1347  Weight: 75.9 kg 75.9 kg    History of present illness:  From admission h and p: Gail Paul is a 89 y.o. female with medical history significant of essential hypertension, diastolic/CHF, osteoarthritis, diet-controlled diabetes, chronic kidney disease stage II, coronary artery disease, peripheral arterial disease, morbid obesity, generalized anxiety disorder, hyperlipidemia, Mild intermittent asthma, depression with anxiety who presents to the ER with altered mental status.  Patient apparently was doing fine until yesterday when she had full dinner.  Thereafter she received some CBD tablets.  Her friend who is a history giver lives with her.  She reported that since the patient took those tablets she was sleeping for the most part today.  She is drowsy now mostly unresponsive.  Brought into the ER like that.  In the ER so far patient has started responding on able to answer some questions but mostly drowsy and out of it.  At this point she has been admitted with acute metabolic encephalopathy.  Suspicion is that her symptoms are drug related.  Will admit her for observation and supportive care    Hospital Course:   Patient  presents with acute metabolic encephalopathy. Appears to be secondary to [facility] product consumed the night before. Lab w/u including infectious w/u is negative, CT head nothing acute. Symptoms resolved and patient has returned to baseline, two friends attest to this. Evaluated by PT and deemed suitable for discharge, has all necessary DME at home, will order home health PT. Other chronic medical problems stable. Advise close f/u with PCP, note moderate BP elevations here.  Procedures: none   Consultations: none  Discharge Exam: Vitals:   11/14/23 0410 11/14/23 0819  BP: (!) 160/68 (!) 156/64  Pulse: 69 66  Resp: 18 18  Temp: 97.8 F (36.6 C) 98 F (36.7 C)  SpO2: 100% 96%    General: NAD Cardiovascular: RRR Respiratory: CTAB  Discharge Instructions   Discharge Instructions     Diet - low sodium heart healthy   Complete by: As directed    Increase activity slowly   Complete by: As directed       Allergies as of 11/14/2023       Reactions   Shellfish Allergy  Swelling   Fenofibrate    Meloxicam     Tramadol          Medication List     TAKE these medications    CENTRUM SILVER 50+WOMEN PO Take 1 tablet by mouth daily.   clopidogrel  75 MG tablet Commonly known as: PLAVIX  Take 1 tablet (75 mg total) by mouth daily.   nitroGLYCERIN  0.4 MG SL tablet Commonly known as: Nitrostat  Place 1 tablet (0.4 mg total) under the tongue every 5 (five) minutes as needed for chest pain.   rosuvastatin  40  MG tablet Commonly known as: CRESTOR  Take 1 tablet (40 mg total) by mouth daily.   triamcinolone  0.025 % ointment Commonly known as: KENALOG  Apply 1 Application topically 2 (two) times daily.       Allergies  Allergen Reactions   Shellfish Allergy  Swelling   Fenofibrate    Meloxicam     Tramadol      Follow-up Information     Gail Fredy RAMAN, MD Follow up.   Specialty: Internal Medicine Why: hospital follow up Contact information: [address] [place]  [address] [phone]                  The results of significant diagnostics from this hospitalization (including imaging, microbiology, ancillary and laboratory) are listed below for reference.    Significant Diagnostic Studies: CT Head Wo Contrast Result Date: 11/11/2023 CLINICAL DATA:  Slurred speech, altered level of consciousness EXAM: CT HEAD WITHOUT CONTRAST TECHNIQUE: Contiguous axial images were obtained from the base of the skull through the vertex without intravenous contrast. RADIATION DOSE REDUCTION: This exam was performed according to the departmental dose-optimization program which includes automated exposure control, adjustment of the mA and/or kV according to patient size and/or use of iterative reconstruction technique. COMPARISON:  None Available. FINDINGS: Brain: No acute infarct or hemorrhage. Lateral ventricles and midline structures are unremarkable. No acute extra-axial fluid collections. No mass effect. Vascular: No hyperdense vessel or unexpected calcification. Skull: Normal. Negative for fracture or focal lesion. Sinuses/Orbits: No acute finding. Other: None. IMPRESSION: Stable head CT, no acute intracranial process. Electronically Signed   By: Ozell Daring M.D.   On: 11/11/2023 18:49   DG Chest Port 1 View Result Date: 11/11/2023 CLINICAL DATA:  Questionable sepsis EXAM: PORTABLE CHEST 1 VIEW COMPARISON:  09/26/2022 FINDINGS: Single frontal view of the chest demonstrates a stable cardiac silhouette. No airspace disease, effusion, or pneumothorax. Stable degenerative changes of the right shoulder. No acute bony abnormalities. IMPRESSION: 1. No acute intrathoracic process. Electronically Signed   By: Ozell Daring M.D.   On: 11/11/2023 18:24    Microbiology: Recent Results (from the past 240 hours)  Blood Culture (routine x 2)     Status: None (Preliminary result)   Collection Time: 11/11/23  6:42 PM   Specimen: BLOOD  Result Value Ref Range Status   Specimen  Description BLOOD LEFT ANTECUBITAL  Final   Special Requests   Final    BOTTLES DRAWN AEROBIC AND ANAEROBIC Blood Culture results may not be optimal due to an inadequate volume of blood received in culture bottles   Culture   Final    NO GROWTH 3 DAYS Performed at [facility], [address], [place], [place] [address]    Report Status PENDING  Incomplete  Blood Culture (routine x 2)     Status: None (Preliminary result)   Collection Time: 11/12/23  1:15 AM   Specimen: BLOOD  Result Value Ref Range Status   Specimen Description BLOOD RIGHT ARM  Final   Special Requests   Final    BOTTLES DRAWN AEROBIC AND ANAEROBIC Blood Culture results may not be optimal due to an inadequate volume of blood received in culture bottles   Culture   Final    NO GROWTH 2 DAYS Performed at [facility], [address] [place], [place] [address]    Report Status PENDING  Incomplete     Labs: Basic Metabolic Panel: Recent Labs  Lab 11/11/23 1737 11/12/23 0115 11/13/23 0323 11/14/23 0551  NA 139 141  143 144  K 4.1 4.4 4.3 4.4  CL 104 107 110 106  CO2 26 26 27 29   GLUCOSE 112* 98 76 111*  BUN 24* 22 20 19   CREATININE 1.25* 1.09* 1.08* 1.11*  CALCIUM  9.3 8.4* 8.6* 9.0   Liver Function Tests: Recent Labs  Lab 11/11/23 1737 11/12/23 0115  AST 23 21  ALT 15 14  ALKPHOS 40 36*  BILITOT 0.8 0.6  PROT 6.9 6.1*  ALBUMIN 4.0 3.4*   No results for input(s): LIPASE, AMYLASE in the last 168 hours. Recent Labs  Lab 11/12/23 0925  AMMONIA 13   CBC: Recent Labs  Lab 11/11/23 1737 11/12/23 0115 11/13/23 0323  WBC 8.0 8.4 7.2  NEUTROABS 4.7  --  5.1  HGB 12.4 10.9* 10.4*  HCT 38.8 34.0* 32.4*  MCV 96.3 96.3 95.0  PLT 248 206 199   Cardiac Enzymes: No results for input(s): CKTOTAL, CKMB, CKMBINDEX, TROPONINI in the last 168 hours. BNP: BNP (last 3 results) No results for input(s): BNP in the last 8760 hours.  ProBNP (last 3 results) No results for  input(s): PROBNP in the last 8760 hours.  CBG: Recent Labs  Lab 11/12/23 0737 11/12/23 1203 11/12/23 1618 11/12/23 1645 11/12/23 1704  GLUCAP 79 91 60* 67* 143*       Signed:  Devaughn KATHEE Ban MD.  [facility] 11/14/2023, 3:38 PM

## 2023-11-15 ENCOUNTER — Telehealth: Payer: Self-pay

## 2023-11-15 NOTE — Transitions of Care (Post Inpatient/ED Visit) (Signed)
   11/15/2023  Name: Gail Paul MRN: [id number] DOB: 1933/11/27  Today's TOC FU Call Status: Today's TOC FU Call Status:: Unsuccessful Call (1st Attempt)  Attempted to reach the patient regarding the most recent Inpatient/ED visit.  Follow Up Plan: Additional outreach attempts will be made to reach the patient to complete the Transitions of Care (Post Inpatient/ED visit) call.   Medford Balboa, BSN, RN [place]  VBCI - [facility] RN Care Manager [phone]

## 2023-11-16 LAB — CULTURE, BLOOD (ROUTINE X 2): Culture: NO GROWTH

## 2023-11-17 LAB — CULTURE, BLOOD (ROUTINE X 2): Culture: NO GROWTH

## 2023-11-18 ENCOUNTER — Telehealth: Payer: Self-pay

## 2023-11-18 NOTE — Transitions of Care (Post Inpatient/ED Visit) (Signed)
   11/18/2023  Name: Gail Paul MRN: [id number] DOB: 1933/08/20  Today's TOC FU Call Status: Today's TOC FU Call Status:: Successful TOC FU Call Completed TOC FU Call Complete Date: 11/18/23 Patient's Name and Date of Birth confirmed.  Transition Care Management Follow-up Telephone Call Date of Discharge: 11/14/23 Discharge Facility: [facility]) Type of Discharge: Inpatient Admission Primary Inpatient Discharge Diagnosis:: AMS How have you been since you were released from the hospital?: Better Any questions or concerns?: No  Items Reviewed: Did you receive and understand the discharge instructions provided?: Yes Medications obtained,verified, and reconciled?: Yes (Medications Reviewed) Any new allergies since your discharge?: No Dietary orders reviewed?: Yes Type of Diet Ordered:: Heart Healthy Do you have support at home?: Yes People in Home [RPT]: alone Name of Support/Comfort Primary Source: Sonya Rankin  Medications Reviewed Today: Medications Reviewed Today     Reviewed by Moises Reusing, RN (Case Manager) on 11/18/23 at 1534  Med List Status: <None>   Medication Order Taking? Sig Documenting Provider Last Dose Status Informant  clopidogrel  (PLAVIX ) 75 MG tablet [id number]  Take 1 tablet (75 mg total) by mouth daily. Fernand Fredy RAMAN, MD  Active Pharmacy Records, Other  Multiple Vitamins-Minerals (CENTRUM SILVER 50+WOMEN PO) [id number]  Take 1 tablet by mouth daily. [provider]  Active Pharmacy Records, Other  nitroGLYCERIN  (NITROSTAT ) 0.4 MG SL tablet [id number]  Place 1 tablet (0.4 mg total) under the tongue every 5 (five) minutes as needed for chest pain. Fernand Denyse LABOR, MD  Active Pharmacy Records, Other           Med Note (BEJOS, ANJA J   Mon Nov 11, 2023 10:21 PM) PRN  rosuvastatin  (CRESTOR ) 40 MG tablet [id number]  Take 1 tablet (40 mg total) by mouth daily. Fernand Denyse LABOR, MD  Active Pharmacy Records, Other  triamcinolone   (KENALOG ) 0.025 % ointment [id number]  Apply 1 Application topically 2 (two) times daily. Fernand Fredy RAMAN, MD  Active Pharmacy Records, Other            Home Care and Equipment/Supplies: Were Home Health Services Ordered?: No Any new equipment or medical supplies ordered?: No  Functional Questionnaire: Do you need assistance with bathing/showering or dressing?: No Do you need assistance with meal preparation?: No Do you need assistance with eating?: No Do you have difficulty maintaining continence: No Do you need assistance with getting out of bed/getting out of a chair/moving?: No Do you have difficulty managing or taking your medications?: No  Follow up appointments reviewed: PCP Follow-up appointment confirmed?: No (The patient does not wish to return to [facility]. Switching to [facility]) MD Provider Line Number:[phone] Given: No Specialist Hospital Follow-up appointment confirmed?: NA Do you need transportation to your follow-up appointment?: No Do you understand care options if your condition(s) worsen?: Yes-patient verbalized understanding  SDOH Interventions Today    Flowsheet Row Most Recent Value  SDOH Interventions   Food Insecurity Interventions Intervention Not Indicated  Housing Interventions Intervention Not Indicated  Transportation Interventions Intervention Not Indicated  Utilities Interventions Intervention Not Indicated    Medford Moises, BSN, RN [place]  VBCI - [facility] RN Care Manager [phone]

## 2023-11-19 ENCOUNTER — Ambulatory Visit: Payer: Self-pay

## 2023-11-19 NOTE — Telephone Encounter (Signed)
 Copied from CRM [id number]. Topic: Clinical - Red Word Triage >> Nov 19, 2023  9:59 AM Cherylann RAMAN wrote: Red Word that prompted transfer to Nurse Triage: Patient called in with complaints of having a nose bleed about 2 days ago. She had a total to 2 nose bleeds that were about 2 -3 days a part. Patient is now bleeding from her vaginal area. The blood was a clear reddish color. Reason for Disposition  Taking Coumadin (warfarin) or other strong blood thinner, or known bleeding disorder (e.g., thrombocytopenia)  Answer Assessment - Initial Assessment Questions Additional info: Community line call. Patient has not called her PCP office at this time, she was recently discharged from hospital and called nurse advise line given in discharge documents. Triage complete, advised to call pcp for further recommendation.     1. BLEEDING SEVERITY: Describe the bleeding that you are having. How much bleeding is there?      moderate 2. ONSET: When did the bleeding begin? Is it continuing now?     today 3. MENOPAUSE: When was your last menstrual period?       4. ABDOMEN PAIN: Do you have any pain? How bad is the pain?  (e.g., Scale 0-10; none, mild, moderate, or severe)     0 5. BLOOD THINNERS: Do you take any blood thinners? (e.g., Coumadin/warfarin, Pradaxa/dabigatran, aspirin )     Yes, unsure name 6. HORMONE MEDICINES: Are you taking any hormone medicines, prescription or OTC? (e.g., birth control pills, estrogen)     Not asked 7. CAUSE: What do you think is causing the bleeding? (e.g., recent gyn surgery, recent gyn procedure; known bleeding disorder, uterine cancer)       Unsure-never happened before  8. HEMODYNAMIC STATUS: Are you weak or feeling lightheaded? If Yes, ask: Can you stand and walk normally?      Not weak or lightheaded 9. OTHER SYMPTOMS: What other symptoms are you having with the bleeding? (e.g., back pain, burning with urination, fever)     Nose bleed 2 days  ago  Protocols used: Vaginal Bleeding - Postmenopausal-A-AH

## 2023-11-22 ENCOUNTER — Ambulatory Visit: Admitting: Internal Medicine

## 2024-01-17 ENCOUNTER — Ambulatory Visit: Admitting: Cardiovascular Disease

## 2024-01-17 ENCOUNTER — Encounter: Payer: Self-pay | Admitting: Cardiovascular Disease

## 2024-01-17 VITALS — BP 134/74 | HR 83 | Ht 62.0 in | Wt 150.4 lb

## 2024-01-17 DIAGNOSIS — Z131 Encounter for screening for diabetes mellitus: Secondary | ICD-10-CM

## 2024-01-17 DIAGNOSIS — I739 Peripheral vascular disease, unspecified: Secondary | ICD-10-CM

## 2024-01-17 DIAGNOSIS — E782 Mixed hyperlipidemia: Secondary | ICD-10-CM

## 2024-01-17 DIAGNOSIS — R55 Syncope and collapse: Secondary | ICD-10-CM

## 2024-01-17 DIAGNOSIS — I5033 Acute on chronic diastolic (congestive) heart failure: Secondary | ICD-10-CM | POA: Diagnosis not present

## 2024-01-17 DIAGNOSIS — R0602 Shortness of breath: Secondary | ICD-10-CM

## 2024-01-17 DIAGNOSIS — I2510 Atherosclerotic heart disease of native coronary artery without angina pectoris: Secondary | ICD-10-CM

## 2024-01-17 DIAGNOSIS — Z0130 Encounter for examination of blood pressure without abnormal findings: Secondary | ICD-10-CM

## 2024-01-17 MED ORDER — LOSARTAN POTASSIUM 25 MG PO TABS: 25.0000 mg | ORAL_TABLET | Freq: Every day | ORAL | 1 refills | Status: AC
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
## 2024-03-02 ENCOUNTER — Encounter: Payer: Self-pay | Admitting: Cardiovascular Disease

## 2024-03-02 ENCOUNTER — Ambulatory Visit: Admitting: Cardiovascular Disease

## 2024-03-02 VITALS — BP 128/70 | HR 79 | Ht 62.0 in | Wt 150.4 lb

## 2024-03-02 DIAGNOSIS — I5033 Acute on chronic diastolic (congestive) heart failure: Secondary | ICD-10-CM | POA: Diagnosis not present

## 2024-03-02 DIAGNOSIS — I10 Essential (primary) hypertension: Secondary | ICD-10-CM | POA: Diagnosis not present

## 2024-03-02 DIAGNOSIS — R55 Syncope and collapse: Secondary | ICD-10-CM

## 2024-03-02 DIAGNOSIS — I2510 Atherosclerotic heart disease of native coronary artery without angina pectoris: Secondary | ICD-10-CM

## 2024-03-02 DIAGNOSIS — I739 Peripheral vascular disease, unspecified: Secondary | ICD-10-CM

## 2024-03-02 DIAGNOSIS — R0602 Shortness of breath: Secondary | ICD-10-CM

## 2024-03-02 DIAGNOSIS — E782 Mixed hyperlipidemia: Secondary | ICD-10-CM | POA: Diagnosis not present

## 2024-03-02 NOTE — Progress Notes (Signed)
 "     Cardiology Office Note   Date:  03/02/2024   ID:  Gail Paul, DOB Aug 19, 1933, MRN [id number]  PCP:  Fernand Fredy RAMAN, MD  Cardiologist:  Denyse Fernand, MD      History of Present Illness: Gail Paul is a 89 y.o. female who presents for  Chief Complaint  Patient presents with   Follow-up    6 week follow up    Has SOB.      Past Medical History:  Diagnosis Date   Arthritis of carpometacarpal [facility]) joint of left thumb 07/11/2021   CHF (congestive heart failure) (HCC)    Heart attack (HCC)    Hypertension    Shortness of breath 12/22/2015   Note: Unchanged     Past Surgical History:  Procedure Laterality Date   BACK SURGERY     carpel tunnel surgery     CATARACT EXTRACTION     ROTATOR CUFF REPAIR Left      Current Outpatient Medications  Medication Sig Dispense Refill   Cholecalciferol  (VITAMIN D3) 50 MCG (2000 UT) capsule Take 2,000 Units by mouth daily.     clopidogrel  (PLAVIX ) 75 MG tablet Take 1 tablet (75 mg total) by mouth daily. 90 tablet 3   losartan  (COZAAR ) 25 MG tablet Take 1 tablet (25 mg total) by mouth daily. 90 tablet 1   Multiple Vitamins-Minerals (CENTRUM SILVER 50+WOMEN PO) Take 1 tablet by mouth daily.     nitroGLYCERIN  (NITROSTAT ) 0.4 MG SL tablet Place 1 tablet (0.4 mg total) under the tongue every 5 (five) minutes as needed for chest pain. 100 tablet 3   pregabalin  (LYRICA ) 75 MG capsule Take 75 mg by mouth 2 (two) times daily.     rosuvastatin  (CRESTOR ) 40 MG tablet Take 1 tablet (40 mg total) by mouth daily. 90 tablet 3   thiamine (VITAMIN B-1) 100 MG tablet Take 100 mg by mouth daily.     triamcinolone  (KENALOG ) 0.025 % ointment Apply 1 Application topically 2 (two) times daily. 80 g 0   vitamin B-12 (CYANOCOBALAMIN ) 100 MCG tablet Take 100 mcg by mouth daily.     No current facility-administered medications for this visit.    Allergies:   Shellfish allergy , Fenofibrate, Meloxicam , and Tramadol     Social History:   reports  that she has never smoked. She has never used smokeless tobacco. She reports that she does not drink alcohol and does not use drugs.   Family History:  family history includes Deep vein thrombosis in her mother; Emphysema in her father; Healthy in her brother; Hypertension in her father; Stroke in her mother.    ROS:     Review of Systems  Constitutional: Negative.   HENT: Negative.    Eyes: Negative.   Respiratory: Negative.    Gastrointestinal: Negative.   Genitourinary: Negative.   Musculoskeletal: Negative.   Skin: Negative.   Neurological: Negative.   Endo/Heme/Allergies: Negative.   Psychiatric/Behavioral: Negative.    All other systems reviewed and are negative.     All other systems are reviewed and negative.    PHYSICAL EXAM: VS:  BP 128/70   Pulse 79   Ht 5' 2 (1.575 m)   Wt 150 lb 6.4 oz (68.2 kg)   SpO2 99%   BMI 27.51 kg/m  , BMI Body mass index is 27.51 kg/m. Last weight:  Wt Readings from Last 3 Encounters:  03/02/24 150 lb 6.4 oz (68.2 kg)  01/17/24 150 lb 6.4 oz (68.2 kg)  11/12/23 167 lb 5.3 oz (75.9 kg)     Physical Exam Constitutional:      Appearance: Normal appearance.  Cardiovascular:     Rate and Rhythm: Normal rate and regular rhythm.     Heart sounds: Normal heart sounds.  Pulmonary:     Effort: Pulmonary effort is normal.     Breath sounds: Normal breath sounds.  Musculoskeletal:     Right lower leg: No edema.     Left lower leg: No edema.  Neurological:     Mental Status: She is alert.       EKG:   Recent Labs: 11/12/2023: ALT 14 11/13/2023: Hemoglobin 10.4; Platelets 199 11/14/2023: BUN 19; Creatinine, Ser 1.11; Potassium 4.4; Sodium 144    Lipid Panel    Component Value Date/Time   CHOL 146 08/20/2023 1006   CHOL 205 (H) 12/21/2012 1855   TRIG 103 08/20/2023 1006   TRIG 167 12/21/2012 1855   HDL 62 08/20/2023 1006   HDL 44 12/21/2012 1855   CHOLHDL 4.3 11/14/2022 1057   CHOLHDL 7.7 09/27/2022 0340   VLDL 39  09/27/2022 0340   VLDL 33 12/21/2012 1855   LDLCALC 65 08/20/2023 1006   LDLCALC 128 (H) 12/21/2012 1855      Other studies Reviewed: Additional studies/ records that were reviewed today include:  Review of the above records demonstrates:       No data to display            ASSESSMENT AND PLAN:    ICD-10-CM   1. CHF (congestive heart failure), NYHA class III, acute on chronic, diastolic (HCC)  I50.33    Compensated.    2. Mixed hyperlipidemia  E78.2     3. Coronary artery disease involving native coronary artery of native heart without angina pectoris  I25.10     4. PAD (peripheral artery disease)  I73.9     5. SOB (shortness of breath)  R06.02     6. Syncope, unspecified syncope type  R55     7. Essential hypertension  I10        Problem List Items Addressed This Visit       Cardiovascular and Mediastinum   CAD (coronary artery disease) (Chronic)   Essential hypertension (Chronic)   PAD (peripheral artery disease)     Other   Hyperlipidemia   Other Visit Diagnoses       CHF (congestive heart failure), NYHA class III, acute on chronic, diastolic (HCC)    -  Primary   Compensated.     SOB (shortness of breath)         Syncope, unspecified syncope type              Disposition:   Return in about 3 months (around 05/31/2024).    Total time spent: 35 minutes  Signed,  Denyse Bathe, MD  03/02/2024 2:20 PM    [facility] "

## 2024-05-22 ENCOUNTER — Ambulatory Visit: Admitting: Cardiovascular Disease
